# Patient Record
Sex: Female | Born: 1945
Health system: Southern US, Community
[De-identification: ages and names within clinical notes are randomized; demographics above are authoritative.]

## PROBLEM LIST (undated history)

## (undated) DIAGNOSIS — E785 Hyperlipidemia, unspecified: Secondary | ICD-10-CM

## (undated) DIAGNOSIS — F32A Depression, unspecified: Secondary | ICD-10-CM

## (undated) DIAGNOSIS — A4902 Methicillin resistant Staphylococcus aureus infection, unspecified site: Secondary | ICD-10-CM

## (undated) DIAGNOSIS — M1990 Unspecified osteoarthritis, unspecified site: Secondary | ICD-10-CM

## (undated) DIAGNOSIS — I10 Essential (primary) hypertension: Secondary | ICD-10-CM

## (undated) DIAGNOSIS — Z8669 Personal history of other diseases of the nervous system and sense organs: Secondary | ICD-10-CM

## (undated) DIAGNOSIS — F329 Major depressive disorder, single episode, unspecified: Secondary | ICD-10-CM

## (undated) DIAGNOSIS — N96 Recurrent pregnancy loss: Secondary | ICD-10-CM

## (undated) DIAGNOSIS — F419 Anxiety disorder, unspecified: Secondary | ICD-10-CM

## (undated) HISTORY — DX: Depression, unspecified: F32.A

## (undated) HISTORY — DX: Unspecified osteoarthritis, unspecified site: M19.90

## (undated) HISTORY — DX: Hyperlipidemia, unspecified: E78.5

## (undated) HISTORY — DX: Personal history of other diseases of the nervous system and sense organs: Z86.69

## (undated) HISTORY — DX: Major depressive disorder, single episode, unspecified: F32.9

## (undated) HISTORY — PX: BREAST EXCISIONAL BIOPSY: SUR124

## (undated) HISTORY — PX: TUMOR EXCISION: SHX421

## (undated) HISTORY — PX: TONSILLECTOMY: SUR1361

## (undated) HISTORY — DX: Recurrent pregnancy loss: N96

## (undated) HISTORY — PX: SKIN CANCER DESTRUCTION: SHX778

## (undated) HISTORY — DX: Anxiety disorder, unspecified: F41.9

## (undated) HISTORY — DX: Methicillin resistant Staphylococcus aureus infection, unspecified site: A49.02

## (undated) HISTORY — DX: Essential (primary) hypertension: I10

## (undated) HISTORY — PX: TUBAL LIGATION: SHX77

---

## 2007-06-16 ENCOUNTER — Ambulatory Visit: Payer: Self-pay | Admitting: Internal Medicine

## 2007-06-16 DIAGNOSIS — Z87898 Personal history of other specified conditions: Secondary | ICD-10-CM

## 2007-06-16 DIAGNOSIS — J309 Allergic rhinitis, unspecified: Secondary | ICD-10-CM

## 2007-06-16 DIAGNOSIS — I10 Essential (primary) hypertension: Secondary | ICD-10-CM

## 2007-06-16 DIAGNOSIS — F411 Generalized anxiety disorder: Secondary | ICD-10-CM | POA: Insufficient documentation

## 2007-06-16 DIAGNOSIS — M129 Arthropathy, unspecified: Secondary | ICD-10-CM | POA: Insufficient documentation

## 2007-06-24 ENCOUNTER — Telehealth (INDEPENDENT_AMBULATORY_CARE_PROVIDER_SITE_OTHER): Payer: Self-pay | Admitting: *Deleted

## 2007-07-01 ENCOUNTER — Ambulatory Visit: Payer: Self-pay | Admitting: Internal Medicine

## 2007-07-01 DIAGNOSIS — L0390 Cellulitis, unspecified: Secondary | ICD-10-CM

## 2007-07-01 DIAGNOSIS — D485 Neoplasm of uncertain behavior of skin: Secondary | ICD-10-CM

## 2007-07-01 DIAGNOSIS — L0291 Cutaneous abscess, unspecified: Secondary | ICD-10-CM

## 2007-07-29 ENCOUNTER — Encounter (INDEPENDENT_AMBULATORY_CARE_PROVIDER_SITE_OTHER): Payer: Self-pay | Admitting: Internal Medicine

## 2007-07-29 ENCOUNTER — Other Ambulatory Visit: Admission: RE | Admit: 2007-07-29 | Discharge: 2007-07-29 | Payer: Self-pay

## 2007-07-29 ENCOUNTER — Telehealth (INDEPENDENT_AMBULATORY_CARE_PROVIDER_SITE_OTHER): Payer: Self-pay | Admitting: *Deleted

## 2007-07-29 ENCOUNTER — Ambulatory Visit: Payer: Self-pay | Admitting: Internal Medicine

## 2007-07-29 LAB — CONVERTED CEMR LAB: OCCULT 1: POSITIVE

## 2007-07-30 LAB — CONVERTED CEMR LAB
Calcium: 9.5 mg/dL (ref 8.4–10.5)
Creatinine, Ser: 0.75 mg/dL (ref 0.40–1.20)
HDL: 59 mg/dL (ref >39)
Triglycerides: 131 mg/dL (ref <150)

## 2007-08-01 ENCOUNTER — Telehealth (INDEPENDENT_AMBULATORY_CARE_PROVIDER_SITE_OTHER): Payer: Self-pay | Admitting: *Deleted

## 2007-08-01 ENCOUNTER — Ambulatory Visit (HOSPITAL_COMMUNITY): Admission: RE | Admit: 2007-08-01 | Discharge: 2007-08-01 | Payer: Self-pay | Admitting: Internal Medicine

## 2007-08-01 ENCOUNTER — Encounter (INDEPENDENT_AMBULATORY_CARE_PROVIDER_SITE_OTHER): Payer: Self-pay | Admitting: Internal Medicine

## 2007-08-02 ENCOUNTER — Encounter (INDEPENDENT_AMBULATORY_CARE_PROVIDER_SITE_OTHER): Payer: Self-pay | Admitting: Internal Medicine

## 2007-08-03 ENCOUNTER — Telehealth (INDEPENDENT_AMBULATORY_CARE_PROVIDER_SITE_OTHER): Payer: Self-pay | Admitting: *Deleted

## 2007-08-03 ENCOUNTER — Encounter (INDEPENDENT_AMBULATORY_CARE_PROVIDER_SITE_OTHER): Payer: Self-pay | Admitting: *Deleted

## 2007-08-04 ENCOUNTER — Ambulatory Visit: Payer: Self-pay | Admitting: Internal Medicine

## 2007-08-05 ENCOUNTER — Encounter (INDEPENDENT_AMBULATORY_CARE_PROVIDER_SITE_OTHER): Payer: Self-pay | Admitting: Internal Medicine

## 2007-11-22 ENCOUNTER — Ambulatory Visit: Payer: Self-pay | Admitting: Internal Medicine

## 2007-12-13 ENCOUNTER — Telehealth (INDEPENDENT_AMBULATORY_CARE_PROVIDER_SITE_OTHER): Payer: Self-pay | Admitting: *Deleted

## 2008-01-23 ENCOUNTER — Ambulatory Visit: Payer: Self-pay | Admitting: Internal Medicine

## 2008-01-23 DIAGNOSIS — R5381 Other malaise: Secondary | ICD-10-CM

## 2008-01-23 DIAGNOSIS — R5383 Other fatigue: Secondary | ICD-10-CM

## 2008-01-24 LAB — CONVERTED CEMR LAB
Basophils Absolute: 0 10*3/uL (ref 0.0–0.1)
Calcium: 10 mg/dL (ref 8.4–10.5)
Eosinophils Absolute: 0 10*3/uL (ref 0.0–0.7)
Eosinophils Relative: 1 % (ref 0–5)
Glucose, Bld: 117 mg/dL — ABNORMAL HIGH (ref 70–99)
HCT: 41 % (ref 36.0–46.0)
MCV: 96.7 fL (ref 78.0–100.0)
Platelets: 316 10*3/uL (ref 150–400)
RDW: 12.6 % (ref 11.5–15.5)
Sodium: 139 meq/L (ref 135–145)
TSH: 0.49 microintl units/mL (ref 0.350–4.50)

## 2008-02-23 ENCOUNTER — Ambulatory Visit: Payer: Self-pay | Admitting: Internal Medicine

## 2008-03-15 ENCOUNTER — Ambulatory Visit: Payer: Self-pay | Admitting: Internal Medicine

## 2008-03-16 ENCOUNTER — Telehealth (INDEPENDENT_AMBULATORY_CARE_PROVIDER_SITE_OTHER): Payer: Self-pay | Admitting: *Deleted

## 2008-03-16 ENCOUNTER — Encounter (INDEPENDENT_AMBULATORY_CARE_PROVIDER_SITE_OTHER): Payer: Self-pay | Admitting: Internal Medicine

## 2008-03-21 ENCOUNTER — Telehealth (INDEPENDENT_AMBULATORY_CARE_PROVIDER_SITE_OTHER): Payer: Self-pay | Admitting: *Deleted

## 2008-04-19 ENCOUNTER — Ambulatory Visit: Payer: Self-pay | Admitting: Internal Medicine

## 2008-07-13 ENCOUNTER — Ambulatory Visit: Payer: Self-pay | Admitting: Internal Medicine

## 2008-07-13 DIAGNOSIS — J019 Acute sinusitis, unspecified: Secondary | ICD-10-CM | POA: Insufficient documentation

## 2008-07-16 ENCOUNTER — Encounter (INDEPENDENT_AMBULATORY_CARE_PROVIDER_SITE_OTHER): Payer: Self-pay | Admitting: Internal Medicine

## 2008-07-17 LAB — CONVERTED CEMR LAB
Chloride: 106 meq/L (ref 96–112)
Cholesterol: 235 mg/dL — ABNORMAL HIGH (ref 0–200)
Creatinine, Ser: 0.7 mg/dL (ref 0.40–1.20)
HDL: 52 mg/dL (ref >39)
LDL Cholesterol: 160 mg/dL — ABNORMAL HIGH (ref 0–99)
Potassium: 4.7 meq/L (ref 3.5–5.3)
Triglycerides: 113 mg/dL (ref <150)

## 2008-07-26 ENCOUNTER — Ambulatory Visit: Payer: Self-pay | Admitting: Infectious Disease

## 2008-07-26 DIAGNOSIS — A4902 Methicillin resistant Staphylococcus aureus infection, unspecified site: Secondary | ICD-10-CM | POA: Insufficient documentation

## 2008-07-26 DIAGNOSIS — J320 Chronic maxillary sinusitis: Secondary | ICD-10-CM

## 2008-07-26 LAB — CONVERTED CEMR LAB
AST: 20 units/L (ref 0–37)
Alkaline Phosphatase: 71 units/L (ref 39–117)
BUN: 20 mg/dL (ref 6–23)
Basophils Absolute: 0 10*3/uL (ref 0.0–0.1)
Basophils Relative: 0 % (ref 0–1)
Creatinine, Ser: 0.74 mg/dL (ref 0.40–1.20)
Eosinophils Absolute: 0.1 10*3/uL (ref 0.0–0.7)
Eosinophils Relative: 1 % (ref 0–5)
Glucose, Bld: 91 mg/dL (ref 70–99)
HCT: 40.4 % (ref 36.0–46.0)
Lymphocytes Relative: 25 % (ref 12–46)
MCHC: 32.9 g/dL (ref 30.0–36.0)
Platelets: 283 10*3/uL (ref 150–400)
RDW: 12.5 % (ref 11.5–15.5)
Total Bilirubin: 0.2 mg/dL — ABNORMAL LOW (ref 0.3–1.2)

## 2008-09-03 ENCOUNTER — Ambulatory Visit: Payer: Self-pay | Admitting: Infectious Disease

## 2008-09-03 DIAGNOSIS — R21 Rash and other nonspecific skin eruption: Secondary | ICD-10-CM

## 2008-11-29 ENCOUNTER — Ambulatory Visit: Payer: Self-pay | Admitting: Internal Medicine

## 2009-01-11 ENCOUNTER — Telehealth (INDEPENDENT_AMBULATORY_CARE_PROVIDER_SITE_OTHER): Payer: Self-pay | Admitting: Internal Medicine

## 2009-02-05 ENCOUNTER — Ambulatory Visit (HOSPITAL_COMMUNITY): Admission: RE | Admit: 2009-02-05 | Discharge: 2009-02-05 | Payer: Self-pay | Admitting: Family Medicine

## 2009-11-12 ENCOUNTER — Encounter: Payer: Self-pay | Admitting: Family Medicine

## 2009-11-12 ENCOUNTER — Ambulatory Visit (HOSPITAL_COMMUNITY): Admission: RE | Admit: 2009-11-12 | Discharge: 2009-11-12 | Payer: Self-pay | Admitting: Family Medicine

## 2009-12-23 ENCOUNTER — Ambulatory Visit: Payer: Self-pay | Admitting: Family Medicine

## 2009-12-23 ENCOUNTER — Encounter: Payer: Self-pay | Admitting: Physician Assistant

## 2009-12-23 DIAGNOSIS — E559 Vitamin D deficiency, unspecified: Secondary | ICD-10-CM | POA: Insufficient documentation

## 2009-12-23 DIAGNOSIS — R079 Chest pain, unspecified: Secondary | ICD-10-CM

## 2009-12-23 DIAGNOSIS — E785 Hyperlipidemia, unspecified: Secondary | ICD-10-CM

## 2009-12-27 ENCOUNTER — Encounter: Payer: Self-pay | Admitting: Physician Assistant

## 2009-12-30 LAB — CONVERTED CEMR LAB
AST: 20 units/L (ref 0–37)
Alkaline Phosphatase: 81 units/L (ref 39–117)
BUN: 15 mg/dL (ref 6–23)
Calcium: 9.4 mg/dL (ref 8.4–10.5)
Creatinine, Ser: 0.75 mg/dL (ref 0.40–1.20)
Glucose, Bld: 73 mg/dL (ref 70–99)
HCT: 39.9 % (ref 36.0–46.0)
HDL: 38 mg/dL — ABNORMAL LOW (ref >39)
Hemoglobin: 13.2 g/dL (ref 12.0–15.0)
MCHC: 33.1 g/dL (ref 30.0–36.0)
RBC: 4.14 M/uL (ref 3.87–5.11)
RDW: 12.5 % (ref 11.5–15.5)
TSH: 2.953 microintl units/mL (ref 0.350–4.500)
Total CHOL/HDL Ratio: 6.7
Triglycerides: 406 mg/dL — ABNORMAL HIGH (ref <150)

## 2009-12-31 ENCOUNTER — Encounter: Payer: Self-pay | Admitting: Adult Health

## 2009-12-31 ENCOUNTER — Ambulatory Visit: Payer: Self-pay | Admitting: Cardiology

## 2009-12-31 ENCOUNTER — Encounter (INDEPENDENT_AMBULATORY_CARE_PROVIDER_SITE_OTHER): Payer: Self-pay | Admitting: *Deleted

## 2010-01-17 ENCOUNTER — Ambulatory Visit (HOSPITAL_COMMUNITY): Admission: RE | Admit: 2010-01-17 | Discharge: 2010-01-17 | Payer: Self-pay | Admitting: Cardiology

## 2010-01-17 ENCOUNTER — Encounter: Payer: Self-pay | Admitting: Cardiology

## 2010-01-17 ENCOUNTER — Ambulatory Visit: Payer: Self-pay | Admitting: Cardiology

## 2010-01-20 ENCOUNTER — Encounter: Payer: Self-pay | Admitting: Physician Assistant

## 2010-01-21 ENCOUNTER — Ambulatory Visit: Payer: Self-pay | Admitting: Cardiology

## 2010-02-12 ENCOUNTER — Telehealth: Payer: Self-pay | Admitting: Adult Health

## 2010-02-24 ENCOUNTER — Other Ambulatory Visit: Admission: RE | Admit: 2010-02-24 | Discharge: 2010-02-24 | Payer: Self-pay | Admitting: Family Medicine

## 2010-02-24 ENCOUNTER — Ambulatory Visit: Payer: Self-pay | Admitting: Family Medicine

## 2010-02-24 DIAGNOSIS — K921 Melena: Secondary | ICD-10-CM | POA: Insufficient documentation

## 2010-02-24 LAB — CONVERTED CEMR LAB
HDL goal, serum: 40 mg/dL
LDL Goal: 130 mg/dL

## 2010-03-05 ENCOUNTER — Telehealth: Payer: Self-pay | Admitting: Family Medicine

## 2010-03-10 ENCOUNTER — Telehealth (INDEPENDENT_AMBULATORY_CARE_PROVIDER_SITE_OTHER): Payer: Self-pay | Admitting: *Deleted

## 2010-03-17 ENCOUNTER — Ambulatory Visit (HOSPITAL_COMMUNITY): Admission: RE | Admit: 2010-03-17 | Payer: Self-pay | Admitting: Family Medicine

## 2010-05-28 ENCOUNTER — Ambulatory Visit
Admission: RE | Admit: 2010-05-28 | Discharge: 2010-05-28 | Payer: Self-pay | Source: Home / Self Care | Attending: Family Medicine | Admitting: Family Medicine

## 2010-05-29 ENCOUNTER — Encounter: Payer: Self-pay | Admitting: Family Medicine

## 2010-05-30 LAB — CONVERTED CEMR LAB
ALT: 24 units/L (ref 0–35)
Albumin: 4.3 g/dL (ref 3.5–5.2)
BUN: 15 mg/dL (ref 6–23)
Chloride: 101 meq/L (ref 96–112)
Cholesterol: 208 mg/dL — ABNORMAL HIGH (ref 0–200)
Hgb A1c MFr Bld: 5.5 % (ref <5.7)
Indirect Bilirubin: 0.4 mg/dL (ref 0.0–0.9)
LDL Cholesterol: 127 mg/dL — ABNORMAL HIGH (ref 0–99)
Potassium: 4 meq/L (ref 3.5–5.3)
Sodium: 138 meq/L (ref 135–145)
Total CHOL/HDL Ratio: 4.3
Total Protein: 6.9 g/dL (ref 6.0–8.3)
Triglycerides: 163 mg/dL — ABNORMAL HIGH (ref <150)
VLDL: 33 mg/dL (ref 0–40)

## 2010-05-31 ENCOUNTER — Encounter: Payer: Self-pay | Admitting: Family Medicine

## 2010-06-01 ENCOUNTER — Encounter: Payer: Self-pay | Admitting: Internal Medicine

## 2010-06-10 NOTE — Letter (Signed)
Summary: [place] Results Engineer, agricultural at [facility]  [address], [place] [address]   Phone: [phone]  Fax: [phone]      January 17, 2010 MRN: [id number]   Sara Woods [address] [place], [place]  [address]   Dear Ms. Onstad,  Your test ordered by Selena Batten has been reviewed by your physician (or physician assistant) and was found to be normal or stable. Your physician (or physician assistant) felt no changes were needed at this time.  __X__ Echocardiogram  ____ Cardiac Stress Test  ____ Lab Work  ____ Peripheral vascular study of arms, legs or neck  ____ CT scan or X-ray  ____ Lung or Breathing test  ____ Other:  Please continue on current medical treatment.  Thank you.   Nona Dell, MD, F.A.C.C

## 2010-06-10 NOTE — Progress Notes (Signed)
Summary: mammogram appointment  Phone Note Outgoing Call   Summary of Call: I called patient to give her her mammogram appointment at [facility] set for Monday Nov. 7, 2011 at 1:20 register time.  Initial call taken by: Curtis Sites,  March 10, 2010 6:22 PM  Follow-up for Phone Call        spoke with patient she is aware of appointment. Follow-up by: Curtis Sites,  March 11, 2010 11:23 AM

## 2010-06-10 NOTE — Letter (Signed)
Summary: Stress Echocardiogram Information Sheet  [facility]  [address], [place] [address]   Phone: [phone]  Fax: [phone]      December 31, 2009 MRN: [id number] light prior to the test.   Sara Woods  Doctor: Appointment Date: Appointment Time: Appointment Location: [facility]  Stress Echocardiogram Information Sheet    Instructions:   1. DO NOT  take your AM  medicine THE MORNING before the test.  2.NOTHING TO EAT OR DRINK AFTER MIDNIGHT  3. Dress prepared to exercise.  4. DO NOT use ANY caffine or tobacco products 3 hours before appointment.  5. Report to the Short Stay Center on the1st floor.  6. Please bring all current prescription medications.  7. If you have any questions, please call [phone]

## 2010-06-10 NOTE — Assessment & Plan Note (Signed)
Summary: PHY   Vital Signs:  Patient profile:   65 year old female Height:      67 inches Weight:      189 pounds BMI:     29.71 O2 Sat:      98 % on Room air Pulse rate:   100 / minute Resp:     16 per minute BP sitting:   110 / 70  (right arm)  Vitals Entered By: Mauricia Area CMA (February 24, 2010 1:11 PM) CC: physical, Lipid Management Comments Did not bring meds   Primary Mayan Kloepfer:  Dr.Margaret Lodema Hong  CC:  physical and Lipid Management.  History of Present Illness: Pt is being seen today for a physical.  Last physical 2+  ago. Overall feeling well and no current complaints or concers. Menopausal. Colonoscopy referral previously done.  Pt needs to call to schedule. Last Mamm 3-09  .   + SBEs Last eye exam  5-6 yrs ago. + routine dental care Last TD > 10 yrs. Had flu vaccine at pharmacy this yr already. Pt has derm appt in November for skin check.  Hx of skin CA. Has seen cardiology.  Started on Crestor for lipids.        Lipid Management History:      Positive NCEP/ATP III risk factors include female age 37 years old or older, HDL cholesterol less than 40, and hypertension.  Negative NCEP/ATP III risk factors include non-tobacco-user status.    Allergies (verified): 1)  Penicillin 2)  Sulfa  Past History:  Past medical history reviewed for relevance to current acute and chronic problems.  Past Medical History: Reviewed history from 12/23/2009 and no changes required. Allergic rhinitis Anxiety Hypertension arthritis migraines, hx skin cancer melanomas - Dr Orvan Falconer in [place] breast cancer sp breast lumpectomy  Recurrent MRSA abscesses  Review of Systems General:  Denies chills, fever, and loss of appetite. Eyes:  Denies blurring and double vision. ENT:  Complains of earache; denies nasal congestion, sinus pressure, and sore throat. CV:  Denies chest pain or discomfort, lightheadness, and palpitations. Resp:  Denies cough and shortness of  breath. GI:  Complains of constipation; denies abdominal pain, bloody stools, change in bowel habits, dark tarry stools, diarrhea, loss of appetite, nausea, and vomiting. GU:  Denies abnormal vaginal bleeding, discharge, dysuria, incontinence, and urinary frequency. MS:  Complains of joint pain; denies joint swelling, low back pain, and mid back pain. Neuro:  Complains of headaches; HX OF MIGRAINES X 20-25 YRS, NO CHANGES. Psych:  Complains of anxiety.  Physical Exam  General:  Well-developed,well-nourished,in no acute distress; alert,appropriate and cooperative throughout examination Head:  Normocephalic and atraumatic without obvious abnormalities. No apparent alopecia or balding. Eyes:  No corneal or conjunctival inflammation noted. EOMI. Perrla. Funduscopic exam benign, without hemorrhages, exudates or papilledema.  Ears:  External ear exam shows no significant lesions or deformities.  Otoscopic examination reveals clear canals, tympanic membranes are intact bilaterally without bulging, retraction, inflammation or discharge. Hearing is grossly normal bilaterally. Nose:  External nasal examination shows no deformity or inflammation. Nasal mucosa are pink and moist without lesions or exudates. Mouth:  Oral mucosa and oropharynx without lesions or exudates.  Teeth in good repair. Neck:  No deformities, masses, or tenderness noted.no thyromegaly, no thyroid nodules or tenderness, and no carotid bruits.   Chest Wall:  no deformities and no mass.   Breasts:  No mass, nodules, thickening, tenderness, bulging, retraction, inflamation, nipple discharge or skin changes noted.   Lungs:  Normal  respiratory effort, chest expands symmetrically. Lungs are clear to auscultation, no crackles or wheezes. Heart:  Normal rate and regular rhythm. S1 and S2 normal without gallop, murmur, click, rub or other extra sounds. Abdomen:  Bowel sounds positive,abdomen soft and non-tender without masses, organomegaly or  hernias noted. Rectal:  No external abnormalities noted. Normal sphincter tone. No rectal masses or tenderness. Genitalia:  Normal introitus for age, no external lesions, no vaginal discharge, mucosa pink and moist, no vaginal or cervical lesions, no vaginal atrophy, no friaility or hemorrhage, normal uterus size and position, no adnexal masses or tenderness Pulses:  R radial normal, R posterior tibial normal, R dorsalis pedis normal, R carotid normal, L radial normal, L posterior tibial normal, L dorsalis pedis normal, and L carotid normal.   Extremities:  No clubbing, cyanosis, edema, or deformity noted with normal full range of motion of all joints.   Neurologic:  alert & oriented X3, sensation intact to light touch, gait normal, and DTRs symmetrical and normal.   Cervical Nodes:  No lymphadenopathy noted Axillary Nodes:  No palpable lymphadenopathy Psych:  Cognition and judgment appear intact. Alert and cooperative with normal attention span and concentration. No apparent delusions, illusions, hallucinations   Impression & Recommendations:  Problem # 1:  PREVENTIVE HEALTH CARE (ICD-V70.0) Encouraed routine eye exam and dental care. TDap given today.  Problem # 2:  HEMOCCULT POSITIVE STOOL (ICD-578.1) Assessment: New  Pt was referred to GI.  They have tried to contact pt and she needs to call them back to schedule.  Encouraged pt to do so.  Orders: Hemoccult Guaiac-1 spec.(in office) (82270)  Problem # 3:  HYPERLIPIDEMIA (ICD-272.4) Assessment: Comment Only  Her updated medication list for this problem includes:    Crestor 10 Mg Tabs (Rosuvastatin calcium) .Marland Kitchen... Take 1 tablet by mouth once a day samples  Orders: T-Lipid Profile ([address]-60[address]) T-Hepatic Function ([address]11-91478)  Problem # 4:  ANXIETY (ICD-300.00) Assessment: Comment Only  Her updated medication list for this problem includes:    Doxepin Hcl 10 Mg Caps (Doxepin hcl) .Marland Kitchen... Take 2 at bedtime  Discussed medication  use and relaxation techniques.   Complete Medication List: 1)  Lisinopril 10 Mg Tabs (Lisinopril) .Marland Kitchen.. 1 by mouth once daily 2)  Vitamin D (ergocalciferol) 50000 Unit Caps (Ergocalciferol) .... Take 1 capsule once a week 3)  Doxepin Hcl 10 Mg Caps (Doxepin hcl) .... Take 2 at bedtime 4)  Loratadine 10 Mg Tabs (Loratadine) .... Take 1 daily as needed for allergies 5)  Bactroban Nasal 2 % Oint (Mupirocin calcium) .... Apply to each nostril as directed 6)  Crestor 10 Mg Tabs (Rosuvastatin calcium) .... Take 1 tablet by mouth once a day samples 7)  Hydrochlorothiazide 25 Mg Tabs (Hydrochlorothiazide) .... Take one tablet by mouth daily.  Other Orders: Mammogram (Screening) (Mammo) Pap Smear (29562) Tdap => 81yrs IM (13086) Admin 1st Vaccine (57846)  Lipid Assessment/Plan:      Based on NCEP/ATP III, the patient's risk factor category is "0-1 risk factors".  The patient's lipid goals are as follows: Total cholesterol goal is 200; LDL cholesterol goal is 130; HDL cholesterol goal is 40; Triglyceride goal is 150.     Patient Instructions: 1)  Please schedule a follow-up appointment in 3 months. 2)  Have blood work drawn fasting in December. 3)  I have ordered a mammogram for you. 4)  Please follow up with DrRourks office for your colonoscopy. 5)  You have received a Tetnus vaccine today (Tdap). 6)  Schedule  an eye exam   Orders Added: 1)  Mammogram (Screening) [Mammo] 2)  T-Lipid Profile [80061-22930] 3)  T-Hepatic Function [80076-22960] 4)  Pap Smear [88150] 5)  Tdap => 45yrs IM [90715] 6)  Admin 1st Vaccine [90471] 7)  Est. Patient 40-64 years [99396] 8)  Hemoccult Guaiac-1 spec.(in office) [82270]   Immunizations Administered:  Tetanus Vaccine:    Vaccine Type: Tdap    Site: left deltoid    Mfr: GlaxoSmithKline    Dose: 0.5 ml    Route: IM    Given by: Mauricia Area CMA    Exp. Date: 02/28/2012    Lot #: [id number]    VIS given: 03/28/08 version given February 24, 2010.   Immunizations Administered:  Tetanus Vaccine:    Vaccine Type: Tdap    Site: left deltoid    Mfr: GlaxoSmithKline    Dose: 0.5 ml    Route: IM    Given by: Mauricia Area CMA    Exp. Date: 02/28/2012    Lot #: [id number]    VIS given: 03/28/08 version given February 24, 2010.   Laboratory Results    Stool - Occult Blood Hemmoccult #1: positive Date: 02/24/2010

## 2010-06-10 NOTE — Assessment & Plan Note (Signed)
Summary: new pt   Vital Signs:  Patient profile:   65 year old female Height:      67 inches Weight:      186 pounds BMI:     29.24 O2 Sat:      98 % on Room air Pulse rate:   82 / minute BP sitting:   140 / 80  (right arm) CC: would like to get established with new pcp/ does feel she has a sinus infection/room 1 Is Patient Diabetic? No   CC:  would like to get established with new pcp/ does feel she has a sinus infection/room 1.  History of Present Illness: New pt here to establish care with new PCP.  Pt states she feels like she has had a sinus infection x 2 weeks.  Pain and decreased hearing in Rt ear. Yellow nasal mucus.  Chills off & on.  Productive cough.  Sore throat x 1 week.  Hx of migraines.  Often triggered by sinus infections.  States the only thing that works is Physiological scientist.  Hx of HTN.  Taking meds as prescribed.  States had an episode of chest pain approx 2 weeks ago.  She has a hx of intermittent chest pain that she associates with stress.  States the pain that she had though 2 weeks ago felt like an elephant sitting on her chest.  No difficulty breathing or diaphoresis.      Allergies: 1)  Penicillin 2)  Sulfa  Past History:  Past medical, surgical, family and social histories (including risk factors) reviewed, and no changes noted (except as noted below).  Past Medical History: Allergic rhinitis Anxiety Hypertension arthritis migraines, hx skin cancer melanomas - Dr Orvan Falconer in [place] breast cancer sp breast lumpectomy  Recurrent MRSA abscesses  Past Surgical History: Reviewed history from 06/16/2007 and no changes required. tongue tumor skin cancer-back of left leg, right shoulder tonsils-childhood  Family History: Reviewed history from 07/29/2007 and no changes required. father-deceased-84-head trauma requiring craniotomy, metastatic prostate cancer-12/07 mother-80-brain tumors, CVD, osteoporosis-12/04 brother-CVD sister-obese,  fibromyalgia son-42-stroke--vertebral artery dissection  Social History: Reviewed history from 07/26/2008 and no changes required. Married lives with spouse, who had been admitted for lumbar surgery SON had CVA in January of 2009 Never Smoked Alcohol use-no Drug use-no  Review of Systems General:  Complains of chills; denies fever. ENT:  Complains of decreased hearing, earache, nasal congestion, postnasal drainage, sinus pressure, and sore throat. CV:  Complains of chest pain or discomfort; denies palpitations; 2 weeks ago. Resp:  Complains of cough and sputum productive. GI:  Denies abdominal pain and change in bowel habits. Allergy:  Complains of seasonal allergies.  Physical Exam  General:  Well-developed,well-nourished,in no acute distress; alert,appropriate and cooperative throughout examination Head:  Normocephalic and atraumatic without obvious abnormalities. No apparent alopecia or balding. Ears:  External ear exam shows no significant lesions or deformities.  Otoscopic examination reveals clear canals, tympanic membranes are intact bilaterally without bulging, retraction, inflammation or discharge. Hearing is grossly normal bilaterally. Nose:  Yellow mucus noted Lt nare, with mild erythema of turbs. no external deformity, R frontal sinus tenderness, and R maxillary sinus tenderness.   Mouth:  Oral mucosa and oropharynx without lesions or exudates.   Neck:  No deformities, masses, or tenderness noted.no thyromegaly.   Lungs:  Normal respiratory effort, chest expands symmetrically. Lungs are clear to auscultation, no crackles or wheezes. Heart:  Normal rate and regular rhythm. S1 and S2 normal without gallop, murmur, click, rub or other extra  sounds. Cervical Nodes:  No lymphadenopathy noted Psych:  Cognition and judgment appear intact. Alert and cooperative with normal attention span and concentration. No apparent delusions, illusions, hallucinations   Impression &  Recommendations:  Problem # 1:  HYPERTENSION (ICD-401.9) Assessment Comment Only  Her updated medication list for this problem includes:    Lisinopril 10 Mg Tabs (Lisinopril) .Marland Kitchen... 1 by mouth once daily  BP today: 140/80 Prior BP: 114/78 (11/29/2008)  Labs Reviewed: K+: 3.9 (07/26/2008) Creat: : 0.74 (07/26/2008)   Chol: 235 (07/16/2008)   HDL: 52 (07/16/2008)   LDL: 160 (07/16/2008)   TG: 113 (07/16/2008)  Problem # 2:  CHEST PAIN (ICD-786.50) Assessment: New  Orders: Cardiology Referral (Cardiology) EKG w/ Interpretation (93000)  Problem # 3:  HYPERLIPIDEMIA (ICD-272.4) Assessment: Comment Only  Orders: T-Lipid Profile [id number])  Labs Reviewed: SGOT: 20 (07/26/2008)   SGPT: 16 (07/26/2008)   HDL:52 (07/16/2008), 59 (07/29/2007)  LDL:160 (07/16/2008), 129 (07/29/2007)  Chol:235 (07/16/2008), 214 (07/29/2007)  Trig:113 (07/16/2008), 131 (07/29/2007)  Problem # 4:  ANXIETY (ICD-300.00) Assessment: Comment Only  The following medications were removed from the medication list:    Citalopram Hydrobromide 20 Mg Tabs (Citalopram hydrobromide) .Marland Kitchen... 1 by mouth at bedtime Her updated medication list for this problem includes:    Doxepin Hcl 10 Mg Caps (Doxepin hcl) .Marland Kitchen... Take 2 at bedtime  Complete Medication List: 1)  Lisinopril 10 Mg Tabs (Lisinopril) .Marland Kitchen.. 1 by mouth once daily 2)  Vitamin D (ergocalciferol) 50000 Unit Caps (Ergocalciferol) .... Take 1 capsule once a week 3)  Doxepin Hcl 10 Mg Caps (Doxepin hcl) .... Take 2 at bedtime 4)  Loratadine 10 Mg Tabs (Loratadine) .... Take 1 daily as needed for allergies 5)  Cephalexin 500 Mg Caps (Cephalexin) .... Take 1 three times a day x 10 days 6)  Nasonex 50 Mcg/act Susp (Mometasone furoate) .... Use 2 sprays each nostril once daily  Other Orders: T-Comprehensive Metabolic Panel (09811-91478) T-CBC No Diff (29562-13086) T-TSH (57846-96295) T-Vitamin D (25-Hydroxy) (28413-24401) Gastroenterology Referral  (GI)  Patient Instructions: 1)  Please schedule a follow-up appointment in 2 months for physical. 2)  I have ordered blood work.  Please have this drawn fasting. 3)  continue your current medications. 4)  I have prescribed an antibiotic and nasal spray for you. 5)  I am referring you to Cardiology and GI as we discussed. Prescriptions: NASONEX 50 MCG/ACT SUSP (MOMETASONE FUROATE) use 2 sprays each nostril once daily  #1 x 2   Entered and Authorized by:   Esperanza Sheets PA   Signed by:   Esperanza Sheets PA on 12/23/2009   Method used:   Electronically to        [facility] [address] (retail)       [address]       [place], [place]  [address]       Ph: [phone] or [phone]       Fax: [phone]   RxID:   [id number] CEPHALEXIN 500 MG CAPS (CEPHALEXIN) take 1 three times a day x 10 days  #30 x 0   Entered and Authorized by:   Esperanza Sheets PA   Signed by:   Esperanza Sheets PA on 12/23/2009   Method used:   Electronically to        [facility] [address] (retail)       [address]       [place]  [place], [place]  [address]       Ph: [phone] or [phone]       Fax: [phone]   RxID:   [id number]

## 2010-06-10 NOTE — Assessment & Plan Note (Signed)
Summary: NP6 CHEST PAIN   Visit Type:  Initial Consult Primary Provider:  Dr.Margaret Lodema Hong  CC:  chest pain.  History of Present Illness: Mrs. Sara Woods is a 21 CF we seeing at the request of Esperanza Sheets PA for chest pain.  Mrs. Sara Woods has not been seen by a cardiologist in the past.  She initally was seen by Ms. Garnette Czech for complaints of sinus infection.  On intial assessment in her office, the patient described chest pressure during the time she was experiencing sinus issues.  She has a strong family hx for heart disease and therefore we are asked to see her for further assessment.  She has not had any recurrance of chest pain. She has experienced no other symptoms.  Preventive Screening-Counseling & Management  Alcohol-Tobacco     Alcohol drinks/day: 0     Smoking Status: never  Current Medications (verified): 1)  Lisinopril 10 Mg  Tabs (Lisinopril) .Marland Kitchen.. 1 By Mouth Once Daily 2)  Vitamin D (Ergocalciferol) 50000 Unit Caps (Ergocalciferol) .... Take 1 Capsule Once A Week 3)  Doxepin Hcl 10 Mg Caps (Doxepin Hcl) .... Take 2 At Bedtime 4)  Loratadine 10 Mg Tabs (Loratadine) .... Take 1 Daily As Needed For Allergies 5)  Cephalexin 500 Mg Caps (Cephalexin) .... Take 1 Three Times A Day X 10 Days 6)  Nasonex 50 Mcg/act Susp (Mometasone Furoate) .... Use 2 Sprays Each Nostril Once Daily 7)  Bactroban Nasal 2 % Oint (Mupirocin Calcium) .... Apply To Each Nostril As Directed 8)  Crestor 10 Mg Tabs (Rosuvastatin Calcium) .... Take 1 Tablet By Mouth Once A Day Samples  Allergies (verified): 1)  Penicillin 2)  Sulfa  Past History:  Past medical, surgical, family and social histories (including risk factors) reviewed, and no changes noted (except as noted below).  Past Medical History: Reviewed history from 12/23/2009 and no changes required. Allergic rhinitis Anxiety Hypertension arthritis migraines, hx skin cancer melanomas - Dr Orvan Falconer in [place] breast cancer sp breast lumpectomy    Recurrent MRSA abscesses  Past Surgical History: Reviewed history from 06/16/2007 and no changes required. tongue tumor skin cancer-back of left leg, right shoulder tonsils-childhood  Family History: Reviewed history from 07/29/2007 and no changes required. father-deceased-84-head trauma requiring craniotomy, metastatic prostate cancer-12/07 mother-80-brain tumors, CVD, osteoporosis-12/04 brother-CVD sister-obese, fibromyalgia son-42-stroke--vertebral artery dissection  Social History: Reviewed history from 07/26/2008 and no changes required. Married lives with spouse, who had been admitted for lumbar surgery SON had CVA in January of 2009 Never Smoked Alcohol use-no Drug use-no  Review of Systems       Chest pressure  All other systems have been reviewed and are negative unless stated above.   Vital Signs:  Patient profile:   65 year old female Weight:      186 pounds BMI:     29.24 Pulse rate:   80 / minute BP sitting:   153 / 83  (right arm)  Vitals Entered By: Dreama Saa, CNA (December 31, 2009 1:43 PM)  Physical Exam  General:  Well developed, well nourished, in no acute distress. Head:  normocephalic and atraumatic Mouth:  Teeth, gums and palate normal. Oral mucosa normal. Lungs:  Clear bilaterally to auscultation and percussion. Heart:  Non-displaced PMI, chest non-tender; regular rate and rhythm, S1, S2 without murmurs, rubs or gallops. Carotid upstroke normal, no bruit. Normal abdominal aortic size, no bruits. Femorals normal pulses, no bruits. Pedals normal pulses. No edema, no varicosities. Abdomen:  Bowel sounds positive; abdomen soft and non-tender  without masses, organomegaly, or hernias noted. No hepatosplenomegaly. Msk:  Back normal, normal gait. Muscle strength and tone normal. Pulses:  pulses normal in all 4 extremities Extremities:  No clubbing or cyanosis. Neurologic:  Alert and oriented x 3. Psych:  Normal affect.   EKG  Procedure date:   12/31/2009  Findings:      Normal sinus rhythm with rate of:  69 bpm   Impression & Recommendations:  Problem # 1:  CHEST PAIN (ICD-786.50) The chest pain has not recurred, but she does have risk factors with family history and hypercholesterolemia.  A stress echo will be completed for evidence of ischemia. Her updated medication list for this problem includes:    Lisinopril 10 Mg Tabs (Lisinopril) .Marland Kitchen... 1 by mouth once daily  Orders: Stress Echo (Stress Echo)  Problem # 2:  HYPERLIPIDEMIA (ICD-272.4) Samples of Crestor 10mg  will be provided.  Teaching on low cholesterol diet is completed.  She may need evaluation for adult onset diabetes at primary care office. Her updated medication list for this problem includes:    Crestor 10 Mg Tabs (Rosuvastatin calcium) .Marland Kitchen... Take 1 tablet by mouth once a day samples  Patient Instructions: 1)  Your physician recommends that you schedule a follow-up appointment in: after tests 2)  Your physician recommends that you return for lab work in: 6 weeks 3)  Your physician has recommended you make the following change in your medication: crestor 10mg  daily samples lot # [id number] exp 12/13 4)  Your physician has requested that you have a stress echocardiogram. For further information please visit [email or web address]  Please follow instruction sheet as given.

## 2010-06-10 NOTE — Procedures (Signed)
Summary: NO SHOW LETTER  NO SHOW LETTER   Imported By: Lind Guest 01/22/2010 15:53:00  _____________________________________________________________________  External Attachment:    Type:   Image     Comment:   External Document

## 2010-06-10 NOTE — Progress Notes (Signed)
Summary: Prescription Needed  Phone Note Call from Patient   Caller: Patient Reason for Call: Refill Medication Summary of Call: patient states that she needs RX for Crestor called to [facility] in [place] Initial call taken by: Raechel Ache [facility],  February 12, 2010 9:35 AM    Prescriptions: CRESTOR 10 MG TABS (ROSUVASTATIN CALCIUM) Take 1 tablet by mouth once a day samples  #30 x 6   Entered by:   Teressa Lower RN   Authorized by:   Joni Reining, NP   Signed by:   Teressa Lower RN on 02/12/2010   Method used:   Electronically to        [facility] [address] (retail)       [address]       [place], [place]  [address]       Ph: [phone] or [phone]       Fax: [phone]   RxID:   [id number]

## 2010-06-10 NOTE — Assessment & Plan Note (Signed)
Summary: 2-3 WK F/U PER CHECKOUT ON 12/31/09/TG   Primary Provider:  Dr.Margaret Lodema Hong   History of Present Illness: Sara Woods is a 65 y/o CF that we are seeing on follow up for chest discomfort.  Sara Woods was scheduled for a stress echo. Sara Woods has no prior history of heart disease.  Sara Woods has a history of HTN, Hypercholesterolemia and anxiety.  Sara Woods has been without complaint with the exception of weight gain.  Current Medications (verified): 1)  Lisinopril 10 Mg  Tabs (Lisinopril) .Marland Kitchen.. 1 By Mouth Once Daily 2)  Vitamin D (Ergocalciferol) 50000 Unit Caps (Ergocalciferol) .... Take 1 Capsule Once A Week 3)  Doxepin Hcl 10 Mg Caps (Doxepin Hcl) .... Take 2 At Bedtime 4)  Loratadine 10 Mg Tabs (Loratadine) .... Take 1 Daily As Needed For Allergies 5)  Nasonex 50 Mcg/act Susp (Mometasone Furoate) .... Use 2 Sprays Each Nostril Once Daily 6)  Bactroban Nasal 2 % Oint (Mupirocin Calcium) .... Apply To Each Nostril As Directed 7)  Crestor 10 Mg Tabs (Rosuvastatin Calcium) .... Take 1 Tablet By Mouth Once A Day Samples 8)  Hydrochlorothiazide 25 Mg Tabs (Hydrochlorothiazide) .... Take One Tablet By Mouth Daily.  Allergies (verified): 1)  Penicillin 2)  Sulfa  Past History:  Past medical, surgical, family and social histories (including risk factors) reviewed, and no changes noted (except as noted below).  Past Medical History: Reviewed history from 12/23/2009 and no changes required. Allergic rhinitis Anxiety Hypertension arthritis migraines, hx skin cancer melanomas - Dr Orvan Falconer in [place] breast cancer sp breast lumpectomy  Recurrent MRSA abscesses  Past Surgical History: Reviewed history from 06/16/2007 and no changes required. tongue tumor skin cancer-back of left leg, right shoulder tonsils-childhood  Family History: Reviewed history from 07/29/2007 and no changes required. father-deceased-84-head trauma requiring craniotomy, metastatic prostate cancer-12/07 mother-80-brain tumors,  CVD, osteoporosis-12/04 brother-CVD sister-obese, fibromyalgia son-42-stroke--vertebral artery dissection  Social History: Reviewed history from 07/26/2008 and no changes required. Married lives with spouse, who had been admitted for lumbar surgery SON had CVA in January of 2009 Never Smoked Alcohol use-no Drug use-no  Review of Systems       All other systems have been reviewed and are negative unless stated above.   Vital Signs:  Patient profile:   65 year old female Height:      67 inches Weight:      187 pounds BMI:     29.39 Pulse rate:   78 / minute Resp:     16 per minute BP sitting:   149 / 91  (left arm)  Vitals Entered By: Marrion Coy, CNA (January 21, 2010 11:27 AM)  Physical Exam  General:  Well developed, well nourished, in no acute distress. Lungs:  Clear bilaterally to auscultation and percussion. Heart:  Non-displaced PMI, chest non-tender; regular rate and rhythm, S1, S2 without murmurs, rubs or gallops. Carotid upstroke normal, no bruit. Normal abdominal aortic size, no bruits. Femorals normal pulses, no bruits. Pedals normal pulses. No edema, no varicosities. Pulses:  pulses normal in all 4 extremities Psych:  Normal affect.   Impression & Recommendations:  Problem # 1:  CHEST PAIN (ICD-786.50) Stress echo read by Dr. Diona Browner on 01/17/2010 demonstrated no areas of ischemia or EKG changes.  Sara Woods was given these results with reassurance that her discomfort was probably not related to cardiac etiology. See on as needed basis. Her updated medication list for this problem includes:    Lisinopril 10 Mg Tabs (Lisinopril) .Marland Kitchen... 1 by mouth once daily  Problem # 2:  HYPERTENSION (ICD-401.9) Blood pressure is not optimal at this time.  Will add HCTZ 12.5mg  to current medications with BP goal, 130's systolically.  Sara Woods will follow-up with primary care provider for continued evaluation and see [place] on as needed basis. Her updated medication list for this problem  includes:    Lisinopril 10 Mg Tabs (Lisinopril) .Marland Kitchen... 1 by mouth once daily    Hydrochlorothiazide 25 Mg Tabs (Hydrochlorothiazide) .Marland Kitchen... Take one tablet by mouth daily.  Problem # 3:  ANXIETY (ICD-300.00) Sara Woods questions use of Doxepin causing weight gain.  I have printed off the drug information concerning this to give to patient to discuss further with primary care provider on follow-up.  Patient Instructions: 1)  Your physician recommends that you schedule a follow-up appointment in: as needed 2)  Your physician has recommended you make the following change in your medication: hydrochlorathiazide 25mg  daily Prescriptions: HYDROCHLOROTHIAZIDE 25 MG TABS (HYDROCHLOROTHIAZIDE) Take one tablet by mouth daily.  #30 x 3   Entered by:   Teressa Lower RN   Authorized by:   Joni Reining, NP   Signed by:   Teressa Lower RN on 01/21/2010   Method used:   Electronically to        [facility] [address] (retail)       [address]       [place], [place]  [address]       Ph: [phone] or [phone]       Fax: [phone]   RxID:   [id number]

## 2010-06-10 NOTE — Progress Notes (Signed)
  Phone Note Other Incoming   Caller: dr simpson Summary of Call: pls advise pt pap is abnormal, she need to have gynaefollow this, pls fax report, and sched appt with Dr Emelda Fear or Despina Hidden , this is impt Initial call taken by: Syliva Overman MD,  March 05, 2010 5:41 PM  Follow-up for Phone Call        PATIENT AWARE AND REFERAL MADE Follow-up by: Adella Hare LPN,  March 06, 2010 10:54 AM     Appended Document:  appt nov 4 12:45 @ [facility]

## 2010-06-12 NOTE — Letter (Signed)
Summary: DOSE  INCREASE  DOSE  INCREASE   Imported By: Lind Guest 05/29/2010 08:38:16  _____________________________________________________________________  External Attachment:    Type:   Image     Comment:   External Document

## 2010-06-15 DIAGNOSIS — M129 Arthropathy, unspecified: Secondary | ICD-10-CM | POA: Insufficient documentation

## 2010-06-15 DIAGNOSIS — R51 Headache: Secondary | ICD-10-CM | POA: Insufficient documentation

## 2010-06-15 DIAGNOSIS — R519 Headache, unspecified: Secondary | ICD-10-CM | POA: Insufficient documentation

## 2010-06-24 ENCOUNTER — Telehealth: Payer: Self-pay | Admitting: Family Medicine

## 2010-06-26 NOTE — Assessment & Plan Note (Signed)
Summary: office visit   Vital Signs:  Patient profile:   65 year old female Height:      67 inches Weight:      190 pounds BMI:     29.87 O2 Sat:      97 % Pulse rate:   83 / minute Pulse rhythm:   regular Resp:     16 per minute BP sitting:   138 / 90  (left arm) Cuff size:   regular  Vitals Entered By: Everitt Amber LPN (May 28, 2010 2:05 PM)  Nutrition Counseling: Patient's BMI is greater than 25 and therefore counseled on weight management options. CC: Follow up chronic problems, has bad migraines and she used to take vicodin at the onset to keep it from getting bad. She wants to know if she can get a prescription. Nasal congestions, ears stopped up, eyes tearing, greenish mucus   Primary Care Provider:  Dr.Dillion Stowers Lodema Hong  CC:  Follow up chronic problems, has bad migraines and she used to take vicodin at the onset to keep it from getting bad. She wants to know if she can get a prescription. Nasal congestions, ears stopped up, eyes tearing, and greenish mucus.  History of Present Illness: 1 week h/o head and chest congestion with frever and chills , also green sputum and green darainage.Generalised body aches, ,malaise, fatigue , generalised  body aches  Current Medications (verified): 1)  Lisinopril 10 Mg  Tabs (Lisinopril) .Marland Kitchen.. 1 By Mouth Once Daily 2)  Vitamin D (Ergocalciferol) 50000 Unit Caps (Ergocalciferol) .... Take 1 Capsule Once A Week 3)  Doxepin Hcl 10 Mg Caps (Doxepin Hcl) .... Take 2 At Bedtime 4)  Loratadine 10 Mg Tabs (Loratadine) .... Take 1 Daily As Needed For Allergies 5)  Bactroban Nasal 2 % Oint (Mupirocin Calcium) .... Apply To Each Nostril As Directed 6)  Crestor 10 Mg Tabs (Rosuvastatin Calcium) .... Take 1 Tablet By Mouth Once A Day Samples 7)  Hydrochlorothiazide 25 Mg Tabs (Hydrochlorothiazide) .... Take One Tablet By Mouth Daily. 8)  Multivitamins  Tabs (Multiple Vitamin) .... Take 1 Tablet By Mouth Once A Day  Allergies (verified): 1)   Penicillin 2)  Sulfa  Past History:  Past medical, surgical, family and social histories (including risk factors) reviewed for relevance to current acute and chronic problems.  Past Medical History: Reviewed history from 12/23/2009 and no changes required. Allergic rhinitis Anxiety Hypertension arthritis migraines, hx skin cancer melanomas - Dr Orvan Falconer in [place] breast cancer sp breast lumpectomy  Recurrent MRSA abscesses  Past Surgical History: Reviewed history from 06/16/2007 and no changes required. tongue tumor skin cancer-back of left leg, right shoulder tonsils-childhood  Family History: Reviewed history from 07/29/2007 and no changes required. father-deceased-84-head trauma requiring craniotomy, metastatic prostate cancer-12/07 mother-80-brain tumors, CVD, osteoporosis-12/04 brother-CVD sister-obese, fibromyalgia son-42-stroke--vertebral artery dissection  Social History: Reviewed history from 07/26/2008 and no changes required. Married lives with spouse, who had been admitted for lumbar surgery SON had CVA in January of 2009 Never Smoked Alcohol use-no Drug use-no  Review of Systems      See HPI General:  Complains of fatigue. Eyes:  Complains of discharge; denies eye pain and red eye. ENT:  Complains of nasal congestion, postnasal drainage, and sinus pressure. CV:  Denies chest pain or discomfort, palpitations, and swelling of feet. Resp:  Complains of cough and sputum productive. GI:  Denies abdominal pain, constipation, diarrhea, nausea, and vomiting. GU:  Denies dysuria and urinary frequency. MS:  Complains of joint pain, stiffness,  and thoracic pain. Neuro:  Complains of headaches; migraine headaches inc with family stres of illness, has aura  with it, saye vicodin prevents /catches early. Psych:  Complains of anxiety, depression, and mental problems; denies panic attacks, suicidal thoughts/plans, thoughts of violence, and unusual visions or  sounds. Endo:  Denies cold intolerance, excessive hunger, excessive thirst, excessive urination, and heat intolerance. Heme:  Denies abnormal bruising and bleeding. Allergy:  Denies hives or rash and itching eyes.  Physical Exam  General:  Well-developed,well-nourished,in no acute distress; alert,appropriate and cooperative throughout examination HEENT: No facial asymmetry,  EOMI, frontal and maxillary sinus tenderness, TM's Clear, oropharynx  pink and moist.   Chest: decreased air entry, scattered crackles and wheezes  CVS: S1, S2, No murmurs, No S3.   Abd: Soft, Nontender.  MS: decreasd  ROM spine, hips, shoulders and knees.  Ext: No edema.   CNS: CN 2-12 intact, power tone and sensation normal throughout.   Skin: Intact, no visible lesions or rashes.  Psych: Good eye contact, normal affect.  Memory intact, anxious  appearing.    Impression & Recommendations:  Problem # 1:  HYPERLIPIDEMIA (ICD-272.4) Assessment Comment Only  The following medications were removed from the medication list:    Crestor 10 Mg Tabs (Rosuvastatin calcium) .Marland Kitchen... Take 1 tablet by mouth once a day samples Her updated medication list for this problem includes:    Pravastatin Sodium 40 Mg Tabs (Pravastatin sodium) .Marland Kitchen... Take 1 tab by mouth at bedtime Low fat dietdiscussed and encouraged  Labs Reviewed: SGOT: 20 (12/27/2009)   SGPT: 18 (12/27/2009)  Lipid Goals: Chol Goal: 200 (02/24/2010)   HDL Goal: 40 (02/24/2010)   LDL Goal: 130 (02/24/2010)   TG Goal: 150 (02/24/2010)  Prior 10 Yr Risk Heart Disease: Not enough information (02/24/2010)   HDL:38 (12/27/2009), 52 (07/16/2008)  LDL:See Comment mg/dL (04/54/0981), 191 (47/82/9562)  Chol:253 (12/27/2009), 235 (07/16/2008)  Trig:406 (12/27/2009), 113 (07/16/2008)  Problem # 2:  HYPERTENSION (ICD-401.9) Assessment: Deteriorated  The following medications were removed from the medication list:    Lisinopril 10 Mg Tabs (Lisinopril) .Marland Kitchen... 1 by mouth  once daily Her updated medication list for this problem includes:    Hydrochlorothiazide 25 Mg Tabs (Hydrochlorothiazide) .Marland Kitchen... Take one tablet by mouth daily.    Lisinopril 20 Mg Tabs (Lisinopril) .Marland Kitchen... Take 1 tablet by mouth once a day dose increase effective 05/28/2010  Orders: T-Basic Metabolic Panel [phone])  BP today: 138/90 Prior BP: 110/70 (02/24/2010)  Prior 10 Yr Risk Heart Disease: Not enough information (02/24/2010)  Labs Reviewed: K+: 4.1 (12/27/2009) Creat: : 0.75 (12/27/2009)   Chol: 253 (12/27/2009)   HDL: 38 (12/27/2009)   LDL: See Comment mg/dL (96/29/5284)   TG: 132 (12/27/2009)  Problem # 3:  ANXIETY (ICD-300.00) Assessment: Unchanged  Her updated medication list for this problem includes:    Doxepin Hcl 10 Mg Caps (Doxepin hcl) .Marland Kitchen... Take 2 at bedtime  Discussed medication use and relaxation techniques.   Problem # 4:  SINUSITIS, ACUTE (ICD-461.9) Assessment: Comment Only  Her updated medication list for this problem includes:    Bactroban Nasal 2 % Oint (Mupirocin calcium) .Marland Kitchen... Apply to each nostril as directed    Doxycycline Hyclate 100 Mg Caps (Doxycycline hyclate) .Marland Kitchen... Take 1 capsule by mouth two times a day    Tessalon Perles 100 Mg Caps (Benzonatate) .Marland Kitchen... Take 1 capsule by mouth three times a day  Problem # 5:  HEADACHE (ICD-784.0) Assessment: Comment Only  Her updated medication list for this problem includes:  Hydrocodone-acetaminophen 5-500 Mg Tabs (Hydrocodone-acetaminophen) ..... One tablet once daily as needed for severe migraine headache, maximum  of 10 tabs/month    Diclofenac Sodium 50 Mg Tbec (Diclofenac sodium) .Marland Kitchen... Take 1 tablet by mouth once a day as needed for joint pain  Problem # 6:  ARTHRITIS, GENERALIZED (ICD-716.99) Assessment: Comment Only diclofenac prescribed  Complete Medication List: 1)  Vitamin D (ergocalciferol) 50000 Unit Caps (Ergocalciferol) .... Take 1 capsule once a week 2)  Doxepin Hcl 10 Mg Caps  (Doxepin hcl) .... Take 2 at bedtime 3)  Loratadine 10 Mg Tabs (Loratadine) .... Take 1 daily as needed for allergies 4)  Bactroban Nasal 2 % Oint (Mupirocin calcium) .... Apply to each nostril as directed 5)  Hydrochlorothiazide 25 Mg Tabs (Hydrochlorothiazide) .... Take one tablet by mouth daily. 6)  Multivitamins Tabs (Multiple vitamin) .... Take 1 tablet by mouth once a day 7)  Doxycycline Hyclate 100 Mg Caps (Doxycycline hyclate) .... Take 1 capsule by mouth two times a day 8)  Tessalon Perles 100 Mg Caps (Benzonatate) .... Take 1 capsule by mouth three times a day 9)  Lisinopril 20 Mg Tabs (Lisinopril) .... Take 1 tablet by mouth once a day dose increase effective 05/28/2010 10)  Hydrocodone-acetaminophen 5-500 Mg Tabs (Hydrocodone-acetaminophen) .... One tablet once daily as needed for severe migraine headache, maximum  of 10 tabs/month 11)  Diclofenac Sodium 50 Mg Tbec (Diclofenac sodium) .... Take 1 tablet by mouth once a day as needed for joint pain 12)  Pravastatin Sodium 40 Mg Tabs (Pravastatin sodium) .... Take 1 tab by mouth at bedtime  Other Orders: T-Lipid Profile ([id number]) T-Hepatic Function [phone]) T- Hemoglobin A1C ([id number])  Patient Instructions: 1)  f/u in 6 weeks. 2)  Meds as discussed. 3)  Dose increase on lisinopril since your blood pressure is high. 4)  i will check on the mamogram and let you know if due. 5)  BMP prior to visit, ICD-9: 6)  Hepatic Panel prior to visit, ICD-9: 7)  Lipid Panel prior to visit, ICD-9: 8)  hBA1c    asap Prescriptions: PRAVASTATIN SODIUM 40 MG TABS (PRAVASTATIN SODIUM) Take 1 tab by mouth at bedtime  #30 x 4   Entered by:   Adella Hare LPN   Authorized by:   Syliva Overman MD   Signed by:   Adella Hare LPN on 62/95/2841   Method used:   Electronically to        [facility] [address] (retail)       [address]       [place], [place]  [address]       Ph: [phone] or  [phone]       Fax: [phone]   RxID:   [id number] DICLOFENAC SODIUM 50 MG TBEC (DICLOFENAC SODIUM) Take 1 tablet by mouth once a day as needed for joint pain  #30 x 3   Entered and Authorized by:   Syliva Overman MD   Signed by:   Syliva Overman MD on 05/28/2010   Method used:   Electronically to        [facility] [address] (retail)       [address]       [place], [place]  [address]       Ph: [phone] or [phone]       Fax: [phone]   RxID:   [id number] HYDROCODONE-ACETAMINOPHEN 5-500 MG  TABS (HYDROCODONE-ACETAMINOPHEN) one tablet once daily as needed for severe migraine headache, maximum  of 10 tabs/month  #10 x 0   Entered and Authorized by:   Syliva Overman MD   Signed by:   Syliva Overman MD on 05/28/2010   Method used:   Printed then faxed to ...       [facility] [address] (retail)       [address]       [place], [place]  [address]       Ph: [phone] or [phone]       Fax: [phone]   RxID:   [id number] LISINOPRIL 20 MG TABS (LISINOPRIL) Take 1 tablet by mouth once a day dose increase effective 05/28/2010  #30 x 3   Entered and Authorized by:   Syliva Overman MD   Signed by:   Syliva Overman MD on 05/28/2010   Method used:   Printed then faxed to ...       [facility] [address] (retail)       [address]       [place], [place]  [address]       Ph: [phone] or [phone]       Fax: [phone]   RxID:   [id number] TESSALON PERLES 100 MG CAPS (BENZONATATE) Take 1 capsule by mouth three times a day  #30 x 0   Entered and Authorized by:   Syliva Overman MD   Signed by:   Syliva Overman MD on 05/28/2010   Method used:   Electronically to        [facility] [address] (retail)       [address]       [place], [place]  [address]       Ph: [phone] or [phone]        Fax: [phone]   RxID:   [id number] DOXYCYCLINE HYCLATE 100 MG CAPS (DOXYCYCLINE HYCLATE) Take 1 capsule by mouth two times a day  #20 x 0   Entered and Authorized by:   Syliva Overman MD   Signed by:   Syliva Overman MD on 05/28/2010   Method used:   Electronically to        [facility] [address] (retail)       [address]       [place], [place]  [address]       Ph: [phone] or [phone]       Fax: [phone]   RxID:   [id number]    Orders Added: 1)  Est. Patient Level IV [[address]] 2)  T-Basic Metabolic Panel [[id number]] 3)  T-Lipid Profile [80061-22930] 4)  T-Hepatic Function [80076-22960] 5)  T- Hemoglobin A1C [83036-23375]

## 2010-07-02 NOTE — Progress Notes (Signed)
Summary: change med  Phone Note Call from Patient   Summary of Call: pt calling about changing crestor. [facility] Initial call taken by: Rudene Anda,  June 24, 2010 3:14 PM  Follow-up for Phone Call        med has already changed, advise her pls Follow-up by: Syliva Overman MD,  June 24, 2010 5:14 PM  Additional Follow-up for Phone Call Additional follow up Details #1::        patient aware Additional Follow-up by: Adella Hare LPN,  June 25, 2010 10:09 AM

## 2010-07-09 ENCOUNTER — Ambulatory Visit (INDEPENDENT_AMBULATORY_CARE_PROVIDER_SITE_OTHER): Payer: Medicare Other | Admitting: Family Medicine

## 2010-07-09 ENCOUNTER — Encounter: Payer: Self-pay | Admitting: Family Medicine

## 2010-07-09 DIAGNOSIS — I10 Essential (primary) hypertension: Secondary | ICD-10-CM

## 2010-07-09 DIAGNOSIS — F329 Major depressive disorder, single episode, unspecified: Secondary | ICD-10-CM

## 2010-07-09 DIAGNOSIS — E559 Vitamin D deficiency, unspecified: Secondary | ICD-10-CM

## 2010-07-09 DIAGNOSIS — R51 Headache: Secondary | ICD-10-CM

## 2010-07-09 DIAGNOSIS — F418 Other specified anxiety disorders: Secondary | ICD-10-CM | POA: Insufficient documentation

## 2010-07-15 ENCOUNTER — Encounter: Payer: Self-pay | Admitting: Internal Medicine

## 2010-07-22 NOTE — Letter (Signed)
Summary: [facility] TRIAGE  [facility] TRIAGE   Imported By: Rexene Alberts 07/15/2010 11:06:43  _____________________________________________________________________  External Attachment:    Type:   Image     Comment:   External Document  Appended Document: [facility] TRIAGE ok as is  Appended Document: [facility] TRIAGE Rx and instuctions mailed.

## 2010-07-22 NOTE — Assessment & Plan Note (Signed)
Summary: follow up 6 weeks   Vital Signs:  Patient profile:   65 year old female Height:      67 inches Weight:      188.25 pounds BMI:     29.59 O2 Sat:      98 % Pulse rate:   99 / minute Pulse rhythm:   regular Resp:     16 per minute BP sitting:   122 / 90  (left arm) Cuff size:   large  Vitals Entered By: Everitt Amber LPN (July 09, 2010 2:08 PM)  Nutrition Counseling: Patient's BMI is greater than 25 and therefore counseled on weight management options. CC: Follow up chronic problems   Primary Care Provider:  Dr.Aodhan Scheidt Lodema Hong  CC:  Follow up chronic problems.  History of Present Illness: Reports  that  she is doing fairly well Denies recent fever or chills. Denies sinus pressure, nasal congestion , ear pain or sore throat. Denies chest congestion, or cough productive of sputum. Denies chest pain, palpitations, PND, orthopnea or leg swelling. Denies abdominal pain, nausea, vomitting, diarrhea or constipation. Denies change in bowel movements or bloody stool. Denies dysuria , frequency, incontinence or hesitancy. Denies  joint pain, swelling, or reduced mobility. Denies  vertigo, seizures.  Denies  rash, lesions, or itch.     Current Medications (verified): 1)  Vitamin D (Ergocalciferol) 50000 Unit Caps (Ergocalciferol) .... Take 1 Capsule Once A Week 2)  Doxepin Hcl 10 Mg Caps (Doxepin Hcl) .... Take 2 At Bedtime 3)  Loratadine 10 Mg Tabs (Loratadine) .... Take 1 Daily As Needed For Allergies 4)  Bactroban Nasal 2 % Oint (Mupirocin Calcium) .... Apply To Each Nostril As Directed 5)  Hydrochlorothiazide 25 Mg Tabs (Hydrochlorothiazide) .... Take One Tablet By Mouth Daily. 6)  Multivitamins  Tabs (Multiple Vitamin) .... Take 1 Tablet By Mouth Once A Day 7)  Lisinopril 20 Mg Tabs (Lisinopril) .... Take 1 Tablet By Mouth Once A Day Dose Increase Effective 05/28/2010 8)  Hydrocodone-Acetaminophen 5-500 Mg Tabs (Hydrocodone-Acetaminophen) .... One Tablet Once  Daily As Needed For Severe Migraine Headache, Maximum  of 10 Tabs/month 9)  Diclofenac Sodium 50 Mg Tbec (Diclofenac Sodium) .... Take 1 Tablet By Mouth Once A Day As Needed For Joint Pain 10)  Pravastatin Sodium 40 Mg Tabs (Pravastatin Sodium) .... Take 1 Tab By Mouth At Bedtime  Allergies (verified): 1)  Penicillin 2)  Sulfa  Review of Systems      See HPI General:  Complains of fatigue. Eyes:  Denies discharge, eye pain, and red eye. Neuro:  Complains of headaches; occasional. Psych:  Complains of depression and easily tearful; denies suicidal thoughts/plans, thoughts of violence, and unusual visions or sounds; alot of personal stress with poor relationships with her family and illness also. Endo:  Denies cold intolerance, excessive hunger, excessive thirst, and excessive urination. Heme:  Denies abnormal bruising, bleeding, enlarge lymph nodes, and fevers. Allergy:  Denies hives or rash and itching eyes.  Physical Exam  General:  Well-developed,well-nourished,in no acute distress; alert,appropriate and cooperative throughout examination HEENT: No facial asymmetry,  EOMI, No sinus tenderness, TM's Clear, oropharynx  pink and moist.   Chest: Clear to auscultation bilaterally.  CVS: S1, S2, No murmurs, No S3.   Abd: Soft, Nontender.  MS: Adequate ROM spine, hips, shoulders and knees.  Ext: No edema.   CNS: CN 2-12 intact, power tone and sensation normal throughout.   Skin: Intact, no visible lesions or rashes.  Psych: Good eye contact, normal affect.  Memory intact, not anxious tearful at times and depressed appearing    Impression & Recommendations:  Problem # 1:  DEPRESSION (ICD-311) Assessment Comment Only  Her updated medication list for this problem includes:    Doxepin Hcl 10 Mg Caps (Doxepin hcl) .Marland Kitchen... Take 2 at bedtime    Fluoxetine Hcl 10 Mg Tabs (Fluoxetine hcl) .Marland Kitchen... Take 1 tablet by mouth once a day  Orders: Medicare Electronic Prescription [phone])  Problem  # 2:  HYPERTENSION (ICD-401.9) Assessment: Improved  Her updated medication list for this problem includes:    Hydrochlorothiazide 25 Mg Tabs (Hydrochlorothiazide) .Marland Kitchen... Take one tablet by mouth daily.    Lisinopril 20 Mg Tabs (Lisinopril) .Marland Kitchen... Take 1 tablet by mouth once a day dose increase effective 05/28/2010 Patient advised to follow low sodium diet rich in fruit and vegetables, and to commit to at least 30 minutes 5 days per week of regular exercise , to improve blood presure control.   Orders: Medicare Electronic Prescription [phone])  BP today: 122/90 Prior BP: 138/90 (05/28/2010)  Prior 10 Yr Risk Heart Disease: Not enough information (02/24/2010)  Labs Reviewed: K+: 4.0 (05/29/2010) Creat: : 0.80 (05/29/2010)   Chol: 208 (05/29/2010)   HDL: 48 (05/29/2010)   LDL: 127 (05/29/2010)   TG: 163 (05/29/2010)  Problem # 3:  HYPERLIPIDEMIA (ICD-272.4) Assessment: Improved  Her updated medication list for this problem includes:    Pravastatin Sodium 40 Mg Tabs (Pravastatin sodium) .Marland Kitchen... Take 1 tab by mouth at bedtime  Orders: T-Lipid Profile [id number] T-Hepatic Function [phone]) Low fat diet discussed and encouraged Labs Reviewed: SGOT: 23 (05/29/2010)   SGPT: 24 (05/29/2010)  Lipid Goals: Chol Goal: 200 (02/24/2010)   HDL Goal: 40 (02/24/2010)   LDL Goal: 130 (02/24/2010)   TG Goal: 150 (02/24/2010)  Prior 10 Yr Risk Heart Disease: Not enough information (02/24/2010)   HDL:48 (05/29/2010), 38 (12/27/2009)  LDL:127 (05/29/2010), See Comment mg/dL (13/24/4010)  UVOZ:366 (05/29/2010), 253 (12/27/2009)  Trig:163 (05/29/2010), 406 (12/27/2009)  Complete Medication List: 1)  Vitamin D (ergocalciferol) 50000 Unit Caps (Ergocalciferol) .... Take 1 capsule once a week 2)  Doxepin Hcl 10 Mg Caps (Doxepin hcl) .... Take 2 at bedtime 3)  Loratadine 10 Mg Tabs (Loratadine) .... Take 1 daily as needed for allergies 4)  Bactroban Nasal 2 % Oint (Mupirocin calcium) .... Apply to  each nostril as directed 5)  Hydrochlorothiazide 25 Mg Tabs (Hydrochlorothiazide) .... Take one tablet by mouth daily. 6)  Multivitamins Tabs (Multiple vitamin) .... Take 1 tablet by mouth once a day 7)  Lisinopril 20 Mg Tabs (Lisinopril) .... Take 1 tablet by mouth once a day dose increase effective 05/28/2010 8)  Hydrocodone-acetaminophen 5-500 Mg Tabs (Hydrocodone-acetaminophen) .... One tablet once daily as needed for severe migraine headache, maximum  of 10 tabs/month 9)  Diclofenac Sodium 50 Mg Tbec (Diclofenac sodium) .... Take 1 tablet by mouth once a day as needed for joint pain 10)  Pravastatin Sodium 40 Mg Tabs (Pravastatin sodium) .... Take 1 tab by mouth at bedtime 11)  Fluoxetine Hcl 10 Mg Tabs (Fluoxetine hcl) .... Take 1 tablet by mouth once a day  Other Orders: T-Vitamin D (25-Hydroxy) ([id number]) Gastroenterology Referral (GI)  Patient Instructions: 1)  Please schedule a follow-up appointment in 3.5 months. 2)  Hepatic Panel prior to visit, ICD-9: 3)  Lipid Panel prior to visit, ICD-9:  in 3.5 months 4)  vit D  Prescriptions: HYDROCHLOROTHIAZIDE 25 MG TABS (HYDROCHLOROTHIAZIDE) Take one tablet by mouth daily.  #30 x 3  Entered by:   Adella Hare LPN   Authorized by:   Syliva Overman MD   Signed by:   Adella Hare LPN on 30/86/5784   Method used:   Printed then faxed to ...       [facility] [address] (retail)       [address]       [place], [place]  [address]       Ph: [phone] or [phone]       Fax: [phone]   RxID:   [id number] FLUOXETINE HCL 10 MG TABS (FLUOXETINE HCL) Take 1 tablet by mouth once a day  #30 x 3   Entered and Authorized by:   Syliva Overman MD   Signed by:   Syliva Overman MD on 07/09/2010   Method used:   Electronically to        [facility] [address] (retail)       [address]       [place], [place]  [address]       Ph: [phone] or  [phone]       Fax: [phone]   RxID:   [id number]    Orders Added: 1)  Est. Patient Level IV [26948] 2)  T-Lipid Profile [[id number]] 3)  T-Hepatic Function [80076-22960] 4)  T-Vitamin D (25-Hydroxy) [phone] 5)  Gastroenterology Referral [GI] 6)  Medicare Electronic Prescription [phone]

## 2010-08-05 ENCOUNTER — Encounter: Payer: Medicare Other | Admitting: Internal Medicine

## 2010-08-05 ENCOUNTER — Ambulatory Visit (HOSPITAL_COMMUNITY)
Admission: RE | Admit: 2010-08-05 | Discharge: 2010-08-05 | Disposition: A | Discharge status: Home / Self Care | Payer: Medicare Other | Source: Ambulatory Visit | Attending: Internal Medicine | Admitting: Internal Medicine

## 2010-08-05 DIAGNOSIS — Z1211 Encounter for screening for malignant neoplasm of colon: Secondary | ICD-10-CM

## 2010-08-05 DIAGNOSIS — E785 Hyperlipidemia, unspecified: Secondary | ICD-10-CM | POA: Insufficient documentation

## 2010-08-05 DIAGNOSIS — K644 Residual hemorrhoidal skin tags: Secondary | ICD-10-CM | POA: Insufficient documentation

## 2010-08-05 DIAGNOSIS — I10 Essential (primary) hypertension: Secondary | ICD-10-CM | POA: Insufficient documentation

## 2010-08-05 DIAGNOSIS — Z79899 Other long term (current) drug therapy: Secondary | ICD-10-CM | POA: Insufficient documentation

## 2010-08-05 DIAGNOSIS — K648 Other hemorrhoids: Secondary | ICD-10-CM | POA: Insufficient documentation

## 2010-08-05 DIAGNOSIS — K5730 Diverticulosis of large intestine without perforation or abscess without bleeding: Secondary | ICD-10-CM

## 2010-08-11 ENCOUNTER — Other Ambulatory Visit: Payer: Self-pay | Admitting: Family Medicine

## 2010-08-11 LAB — LIPID PANEL
Cholesterol: 185 mg/dL (ref 0–200)
Triglycerides: 210 mg/dL — ABNORMAL HIGH (ref <150)

## 2010-08-11 LAB — HEPATIC FUNCTION PANEL
ALT: 17 U/L (ref 0–35)
AST: 21 U/L (ref 0–37)
Alkaline Phosphatase: 68 U/L (ref 39–117)
Bilirubin, Direct: 0.1 mg/dL (ref 0.0–0.3)
Indirect Bilirubin: 0.3 mg/dL (ref 0.0–0.9)
Total Protein: 7 g/dL (ref 6.0–8.3)

## 2010-08-12 LAB — VITAMIN D 25 HYDROXY (VIT D DEFICIENCY, FRACTURES): Vit D, 25-Hydroxy: 55 ng/mL (ref 30–89)

## 2010-08-19 NOTE — Op Note (Signed)
  NAME:  Sara Woods, OTTAWAY                ACCOUNT NO.:  [id number]  MEDICAL RECORD NO.:  [id number]           PATIENT TYPE:  O  LOCATION:  [facility]                          FACILITY:  [facility]  PHYSICIAN:  R. Roetta Sessions, M.D. DATE OF BIRTH:  [birth year]  DATE OF PROCEDURE:  08/05/2010 DATE OF DISCHARGE:                              OPERATIVE REPORT   COLONOSCOPY  INDICATIONS FOR PROCEDURE:  A 65 year old lady referred at courtesy of Dr. Syliva Overman for her first ever colonoscopy.  She is devoid of any lower GI tract symptoms.  No family history of colon polyps or colon cancer.  Colonoscopy is now being done as standard screening maneuver. Risks, benefits, limitations, alternatives, imponderables have been discussed, questions answered.  Please see documentation in the medical record.  PROCEDURE NOTE:  O2 saturation, blood pressure, pulse, respirations were monitored throughout the entire procedure.  CONSCIOUS SEDATION:  Versed 4 mg IV Demerol 100 mg IV in divided doses.  INSTRUMENT:  Pentax video chip system.  FINDINGS:  Digital rectal exam revealed external hemorrhoidal tags, otherwise negative.  Endoscopic findings:  Prep was adequate.  Colon: Colonic mucosa was surveyed from the rectosigmoid junction through the left transverse right colon to the appendiceal orifice, ileocecal valve/cecum.  These structures were well seen and photographed for the record.  From this level, scope was slowly and cautiously withdrawn. All previously mentioned mucosal surfaces were again seen.  The patient had tortuous left colon requiring external abdominal pressure to facilitate scope advancement.  The patient was noted to have extensive left-sided diverticula, however, the remainder of colonic mucosa appeared normal.  Scope was pulled down the rectum where a thorough examination of rectal mucosa including retroflexed view of the anal verge demonstrated only internal hemorrhoids and anal papilla.   The patient tolerated the procedure well.  Cecal withdrawal time 10 minutes.  IMPRESSION:  External, internal hemorrhoids and anal papilla otherwise normal rectum, tortuous left colon, left-sided diverticula and colonic mucosa appeared normal.  RECOMMENDATIONS: 1. Diverticulosis literature provided to Ms. Daphine Deutscher. 2. Repeat screening colonoscopy in 10 years.     Jonathon Bellows, M.D.     RMR/MEDQ  D:  08/05/2010  T:  08/05/2010  Job:  [id number]  cc:   Milus Mallick. Lodema Hong, M.D. Fax: [phone]  Electronically Signed by Lorrin Goodell M.D. on 08/19/2010 02:57:32 PM

## 2010-08-21 ENCOUNTER — Telehealth: Payer: Self-pay | Admitting: Family Medicine

## 2010-09-04 ENCOUNTER — Telehealth: Payer: Self-pay

## 2010-09-04 MED ORDER — HYDROCODONE-ACETAMINOPHEN 5-500 MG PO TABS: 1.0000 | ORAL_TABLET | Freq: Every day | ORAL | Status: DC | PRN

## 2010-09-04 NOTE — Telephone Encounter (Signed)
Patient was VERY upset over having to wait so long for her refill. It was only written for 10 with no refill so I thought it was a one time med. She said you know she has terrible migraines and she has been suffering for 3 weeks now with nothing. I assured her as soon as I talked to you I would send in her refill. [facility].

## 2010-09-04 NOTE — Telephone Encounter (Signed)
Already filled and faxed in

## 2010-09-04 NOTE — Telephone Encounter (Signed)
Called patient, left message.

## 2010-09-04 NOTE — Telephone Encounter (Signed)
Refill x 2 and advise needs appt ion the next 2 months

## 2010-09-10 NOTE — Telephone Encounter (Signed)
error 

## 2010-09-30 ENCOUNTER — Other Ambulatory Visit: Payer: Self-pay | Admitting: Family Medicine

## 2010-10-20 ENCOUNTER — Encounter: Payer: Self-pay | Admitting: Family Medicine

## 2010-10-22 ENCOUNTER — Other Ambulatory Visit: Payer: Self-pay | Admitting: Family Medicine

## 2010-10-23 ENCOUNTER — Encounter: Payer: Self-pay | Admitting: Family Medicine

## 2010-10-27 ENCOUNTER — Ambulatory Visit: Payer: Medicare Other | Admitting: Family Medicine

## 2010-10-27 ENCOUNTER — Encounter: Payer: Self-pay | Admitting: Family Medicine

## 2010-10-28 ENCOUNTER — Encounter: Payer: Self-pay | Admitting: Family Medicine

## 2010-11-29 ENCOUNTER — Other Ambulatory Visit: Payer: Self-pay | Admitting: Family Medicine

## 2010-12-05 ENCOUNTER — Telehealth: Payer: Self-pay

## 2010-12-08 ENCOUNTER — Other Ambulatory Visit: Payer: Self-pay | Admitting: Family Medicine

## 2010-12-08 DIAGNOSIS — G4700 Insomnia, unspecified: Secondary | ICD-10-CM

## 2010-12-08 MED ORDER — DOXEPIN HCL 10 MG PO CAPS: 10.0000 mg | ORAL_CAPSULE | Freq: Every day | ORAL | Status: DC

## 2010-12-08 NOTE — Telephone Encounter (Signed)
Med sent in.

## 2010-12-08 NOTE — Telephone Encounter (Signed)
Script for one capsule at night only, has been sent in, pls let her know, no changes  Will be made in the script without oV

## 2010-12-10 ENCOUNTER — Telehealth: Payer: Self-pay | Admitting: Family Medicine

## 2010-12-10 DIAGNOSIS — G4700 Insomnia, unspecified: Secondary | ICD-10-CM

## 2010-12-10 MED ORDER — DOXEPIN HCL 10 MG PO CAPS: ORAL_CAPSULE | ORAL | Status: DC

## 2010-12-10 NOTE — Telephone Encounter (Signed)
Med sent as requested 

## 2010-12-18 ENCOUNTER — Encounter: Payer: Self-pay | Admitting: Family Medicine

## 2010-12-19 ENCOUNTER — Encounter: Payer: Self-pay | Admitting: Family Medicine

## 2010-12-19 ENCOUNTER — Ambulatory Visit (INDEPENDENT_AMBULATORY_CARE_PROVIDER_SITE_OTHER): Payer: Medicare Other | Admitting: Family Medicine

## 2010-12-19 VITALS — BP 146/84 | HR 81 | Resp 16 | Ht 65.5 in | Wt 188.0 lb

## 2010-12-19 DIAGNOSIS — R5383 Other fatigue: Secondary | ICD-10-CM

## 2010-12-19 DIAGNOSIS — R51 Headache: Secondary | ICD-10-CM

## 2010-12-19 DIAGNOSIS — J309 Allergic rhinitis, unspecified: Secondary | ICD-10-CM

## 2010-12-19 DIAGNOSIS — E785 Hyperlipidemia, unspecified: Secondary | ICD-10-CM

## 2010-12-19 DIAGNOSIS — F3289 Other specified depressive episodes: Secondary | ICD-10-CM

## 2010-12-19 DIAGNOSIS — Z87898 Personal history of other specified conditions: Secondary | ICD-10-CM

## 2010-12-19 DIAGNOSIS — I10 Essential (primary) hypertension: Secondary | ICD-10-CM

## 2010-12-19 DIAGNOSIS — L0390 Cellulitis, unspecified: Secondary | ICD-10-CM

## 2010-12-19 DIAGNOSIS — Z23 Encounter for immunization: Secondary | ICD-10-CM

## 2010-12-19 DIAGNOSIS — R5381 Other malaise: Secondary | ICD-10-CM

## 2010-12-19 DIAGNOSIS — L0291 Cutaneous abscess, unspecified: Secondary | ICD-10-CM

## 2010-12-19 DIAGNOSIS — F329 Major depressive disorder, single episode, unspecified: Secondary | ICD-10-CM

## 2010-12-19 MED ORDER — ZOSTER VACCINE LIVE 19400 UNT/0.65ML ~~LOC~~ SOLR: 0.6500 mL | Freq: Once | SUBCUTANEOUS | Status: DC

## 2010-12-19 MED ORDER — LISINOPRIL 30 MG PO TABS: 30.0000 mg | ORAL_TABLET | Freq: Every day | ORAL | Status: DC

## 2010-12-19 MED ORDER — FLUOXETINE HCL 20 MG PO CAPS: 20.0000 mg | ORAL_CAPSULE | Freq: Every day | ORAL | Status: DC

## 2010-12-19 MED ORDER — FLUTICASONE PROPIONATE 50 MCG/ACT NA SUSP: 1.0000 | Freq: Every day | NASAL | Status: DC

## 2010-12-19 MED ORDER — TOPIRAMATE 25 MG PO TABS: 25.0000 mg | ORAL_TABLET | Freq: Every day | ORAL | Status: DC

## 2010-12-19 NOTE — Assessment & Plan Note (Signed)
Medication compliance addressed. Commitment to regular exercise and healthy  food choices, with portion control discussed. DASH diet and low fat diet discussed and literature offered. Changes in medication made at this visit.  

## 2010-12-19 NOTE — Patient Instructions (Addendum)
F/u in 3 months.  Dose increase on fluoxetine.  Try to limit doxepin to a max of 2 at night.   New med topamax for preventing migraines , take every day as prescribed.  Plan to limit vicodin to 10 per month for bad headache.  Dose increase on lisinopril 20mg   to ONE and a HALF once daily, your blood pressure is high, new med is lisinopril 30mg  ONE daily  Fasting labs 3 days before vist, cbc, chem 7 and TSH.lipid, hepatic  LABWORK  NEEDS TO BE DONE BETWEEN 3 TO 7 DAYS BEFORE YOUR NEXT SCEDULED  VISIT.  THIS WILL IMPROVE THE QUALITY OF YOUR CARE.  Discontinue prescription vit D when you have finished this prescription   A healthy diet is rich in fruit, vegetables and whole grains. Poultry fish, nuts and beans are a healthy choice for protein rather then red meat. A low sodium diet and drinking 64 ounces of water daily is generally recommended. Oils and sweet should be limited. Carbohydrates especially for those who are diabetic or overweight, should be limited to 34-45 gram per meal. It is important to eat on a regular schedule, at least 3 times daily. Snacks should be primarily fruits, vegetables or nuts. It is important that you exercise regularly at least 30 minutes 5 times a week. If you develop chest pain, have severe difficulty breathing, or feel very tired, stop exercising immediately and seek medical attention

## 2010-12-20 NOTE — Assessment & Plan Note (Signed)
Uncontrolled flonase  prescribed

## 2010-12-20 NOTE — Assessment & Plan Note (Signed)
Improved on fluoxetine, will increase dose

## 2010-12-20 NOTE — Progress Notes (Signed)
  Subjective:    Patient ID: Sara Woods, female    DOB: [birth year], 65 y.o.   MRN: [id number]  HPI The PT is here for follow up and re-evaluation of chronic medical conditions, medication management and review of any available recent lab and radiology data.  Preventive health is updated, specifically  Cancer screening and Immunization.   Questions or concerns regarding consultations or procedures which the PT has had in the interim are  addressed. The PT denies any adverse reactions to current medications since the last visit.  Requests vaccines that are due      Review of Systems Denies recent fever or chills. Denies sinus pressure, ear pain or sore throat.c/o increased and uncontrolled allergy symptoms of nasal congestion with clea drainage and excessive sneezing and watery eyes Denies chest congestion, productive cough or wheezing. Denies chest pains, palpitations and leg swelling Denies abdominal pain, nausea, vomiting,diarrhea or constipation.   Denies dysuria, frequency, hesitancy or incontinence. Denies joint pain, swelling and limitation in mobility. Denies  seizures, numbness, or tingling.Reports increased frequency of migraines , on an almost daily basis, no new or specific triggers identified Improved depression and  anxiety ,insomnia responds to doxepin. Still has anger and pain which she is not interested in seeing a therapist for Denies skin break down or rash.        Objective:   Physical Exam Patient alert and oriented and in no cardiopulmonary distress.  HEENT: No facial asymmetry, EOMI, no sinus tenderness,  oropharynx pink and moist.  Neck supple no adenopathy.  Chest: Clear to auscultation bilaterally.  CVS: S1, S2 no murmurs, no S3.  ABD: Soft non tender. Bowel sounds normal.  Ext: No edema  MS: Adequate ROM spine, shoulders, hips and knees.  Skin: Intact, no ulcerations or rash noted.  Psych: Good eye contact, normal affect. Memory intact not  anxious, depressed appearing and tearful at times, espescialy when  discussing the suffering of her son. Not suicidal or homicidal  CNS: CN 2-12 intact, power, tone and sensation normal throughout.        Assessment & Plan:

## 2010-12-20 NOTE — Assessment & Plan Note (Signed)
Uncontrolled , start prophylactic medication and limit vicodin

## 2011-01-02 ENCOUNTER — Other Ambulatory Visit: Payer: Self-pay | Admitting: Family Medicine

## 2011-01-05 ENCOUNTER — Other Ambulatory Visit: Payer: Self-pay | Admitting: Family Medicine

## 2011-02-12 ENCOUNTER — Other Ambulatory Visit: Payer: Self-pay | Admitting: Family Medicine

## 2011-03-13 ENCOUNTER — Ambulatory Visit: Payer: Medicare Other | Admitting: Family Medicine

## 2011-03-21 ENCOUNTER — Other Ambulatory Visit: Payer: Self-pay | Admitting: Family Medicine

## 2011-03-24 LAB — CBC WITH DIFFERENTIAL/PLATELET
Basophils Absolute: 0 10*3/uL (ref 0.0–0.1)
Eosinophils Absolute: 0 10*3/uL (ref 0.0–0.7)
Eosinophils Relative: 1 % (ref 0–5)
HCT: 38.8 % (ref 36.0–46.0)
Lymphocytes Relative: 30 % (ref 12–46)
MCH: 30.3 pg (ref 26.0–34.0)
MCV: 94.9 fL (ref 78.0–100.0)
Monocytes Absolute: 0.2 10*3/uL (ref 0.1–1.0)
Platelets: 328 10*3/uL (ref 150–400)
RDW: 12.8 % (ref 11.5–15.5)
WBC: 4.6 10*3/uL (ref 4.0–10.5)

## 2011-03-24 LAB — HEPATIC FUNCTION PANEL
AST: 23 U/L (ref 0–37)
Alkaline Phosphatase: 68 U/L (ref 39–117)
Indirect Bilirubin: 0.4 mg/dL (ref 0.0–0.9)
Total Bilirubin: 0.5 mg/dL (ref 0.3–1.2)

## 2011-03-24 LAB — LIPID PANEL
LDL Cholesterol: 112 mg/dL — ABNORMAL HIGH (ref 0–99)
Triglycerides: 166 mg/dL — ABNORMAL HIGH (ref <150)

## 2011-03-24 LAB — BASIC METABOLIC PANEL
BUN: 15 mg/dL (ref 6–23)
CO2: 25 mEq/L (ref 19–32)
Calcium: 10 mg/dL (ref 8.4–10.5)
Chloride: 104 mEq/L (ref 96–112)
Creat: 0.93 mg/dL (ref 0.50–1.10)

## 2011-03-30 ENCOUNTER — Encounter: Payer: Self-pay | Admitting: Family Medicine

## 2011-04-06 ENCOUNTER — Ambulatory Visit (INDEPENDENT_AMBULATORY_CARE_PROVIDER_SITE_OTHER): Payer: Medicare Other | Admitting: Family Medicine

## 2011-04-06 ENCOUNTER — Encounter: Payer: Self-pay | Admitting: Family Medicine

## 2011-04-06 VITALS — BP 120/78 | HR 107 | Resp 16 | Ht 65.5 in | Wt 175.1 lb

## 2011-04-06 DIAGNOSIS — J019 Acute sinusitis, unspecified: Secondary | ICD-10-CM

## 2011-04-06 DIAGNOSIS — F329 Major depressive disorder, single episode, unspecified: Secondary | ICD-10-CM

## 2011-04-06 DIAGNOSIS — R51 Headache: Secondary | ICD-10-CM

## 2011-04-06 DIAGNOSIS — J320 Chronic maxillary sinusitis: Secondary | ICD-10-CM

## 2011-04-06 DIAGNOSIS — J40 Bronchitis, not specified as acute or chronic: Secondary | ICD-10-CM

## 2011-04-06 DIAGNOSIS — E785 Hyperlipidemia, unspecified: Secondary | ICD-10-CM

## 2011-04-06 DIAGNOSIS — I10 Essential (primary) hypertension: Secondary | ICD-10-CM

## 2011-04-06 DIAGNOSIS — G43909 Migraine, unspecified, not intractable, without status migrainosus: Secondary | ICD-10-CM

## 2011-04-06 DIAGNOSIS — L0291 Cutaneous abscess, unspecified: Secondary | ICD-10-CM

## 2011-04-06 MED ORDER — HYDROCHLOROTHIAZIDE 25 MG PO TABS: ORAL_TABLET | ORAL | Status: DC

## 2011-04-06 MED ORDER — PROMETHAZINE-DM 6.25-15 MG/5ML PO SYRP: ORAL_SOLUTION | ORAL | Status: DC

## 2011-04-06 MED ORDER — DOXYCYCLINE HYCLATE 100 MG PO TABS: 100.0000 mg | ORAL_TABLET | Freq: Two times a day (BID) | ORAL | Status: DC

## 2011-04-06 MED ORDER — BENZONATATE 100 MG PO CAPS: 100.0000 mg | ORAL_CAPSULE | Freq: Four times a day (QID) | ORAL | Status: DC | PRN

## 2011-04-06 MED ORDER — FLUTICASONE PROPIONATE 50 MCG/ACT NA SUSP: 1.0000 | Freq: Every day | NASAL | Status: DC

## 2011-04-06 MED ORDER — PROMETHAZINE-DM 6.25-15 MG/5ML PO SYRP: ORAL_SOLUTION | ORAL | Status: AC

## 2011-04-06 MED ORDER — HYDROCODONE-ACETAMINOPHEN 5-500 MG PO TABS: 1.0000 | ORAL_TABLET | Freq: Every day | ORAL | Status: DC | PRN

## 2011-04-06 MED ORDER — DOXYCYCLINE HYCLATE 100 MG PO TABS: 100.0000 mg | ORAL_TABLET | Freq: Two times a day (BID) | ORAL | Status: AC

## 2011-04-06 NOTE — Progress Notes (Signed)
  Subjective:    Patient ID: Sara Woods, female    DOB: [birth year], 65 y.o.   MRN: [id number]  HPI 5 weeek h/o sinus congestion, green nasal drainage, sore in nose, sore throat and cough productive of yellow sputum. Entire household is sick. Otherwise she has no new complaints. She is here for routine follow up  Of her chronic conditions as well as to review recent lab data  Review of Systems See HPI Denies chest pains, palpitations and leg swelling Denies abdominal pain, nausea, vomiting,diarrhea or constipation.   Denies dysuria, frequency, hesitancy or incontinence. Denies joint pain, swelling and limitation in mobility. Denies uncontrolled  Headaches,but states she has been out of her pain medication for several weeks, this she uses only rarely however, she denies seizures, numbness, or tingling. Denies uncontrolled depression, anxiety or insomnia.States medication has helped a lot. Denies skin break down or rash.        Objective:   Physical Exam  Patient alert and oriented and in no cardiopulmonary distress.Ill appearing  HEENT: No facial asymmetry, EOMI, frontal sinus tenderness,  oropharynx pink and moist.  Neck supple, anterior cervical  adenopathy.  Chest:decreased air entry, bilateral crackles , few wheezes  CVS: S1, S2 no murmurs, no S3.  ABD: Soft non tender. Bowel sounds normal.  Ext: No edema  MS: Adequate ROM spine, shoulders, hips and knees.  Skin: Intact, no ulcerations or rash noted.  Psych: Good eye contact, normal affect. Memory intact not anxious or depressed appearing.  CNS: CN 2-12 intact, power, tone and sensation normal throughout.       Assessment & Plan:

## 2011-04-06 NOTE — Patient Instructions (Addendum)
CPE in 4.5 months.   Congrats on weight loss, keep it up.  Cholesterol has increased , pls cut back on sugary foods and fried and fattty foods, also butter and cheese  Medication is sent in for sinusitis and bronchitis   Please schedule your mammogram this is past due  LABWORK  NEEDS TO BE DONE BETWEEN 3 TO 7 DAYS BEFORE YOUR NEXT SCEDULED  VISIT.  THIS WILL IMPROVE THE QUALITY OF YOUR CARE. Fasting lipid, hepatic and chem 7 in 4.5 months

## 2011-04-07 ENCOUNTER — Telehealth: Payer: Self-pay | Admitting: Family Medicine

## 2011-04-07 MED ORDER — FLUTICASONE PROPIONATE 50 MCG/ACT NA SUSP: 1.0000 | Freq: Every day | NASAL | Status: DC

## 2011-04-07 MED ORDER — BENZONATATE 100 MG PO CAPS: 100.0000 mg | ORAL_CAPSULE | Freq: Four times a day (QID) | ORAL | Status: DC | PRN

## 2011-04-07 MED ORDER — HYDROCHLOROTHIAZIDE 25 MG PO TABS: ORAL_TABLET | ORAL | Status: DC

## 2011-04-07 MED ORDER — HYDROCODONE-ACETAMINOPHEN 5-500 MG PO TABS: 1.0000 | ORAL_TABLET | Freq: Every day | ORAL | Status: DC | PRN

## 2011-04-07 NOTE — Telephone Encounter (Signed)
Resent in

## 2011-04-12 NOTE — Assessment & Plan Note (Signed)
Frequency and severity unchanged, will maintain pt on pain med as before on as needed basis with low pill count/month

## 2011-04-12 NOTE — Assessment & Plan Note (Signed)
Improved , but still uncontrolled, dietary change discussed and stressed, need to reduce fat intake on low fat diet

## 2011-04-12 NOTE — Assessment & Plan Note (Signed)
Acute sinusitis , will treat agresively with antibiotic course

## 2011-04-12 NOTE — Assessment & Plan Note (Signed)
Currently stable no change in medication

## 2011-04-12 NOTE — Assessment & Plan Note (Signed)
Controlled, no change in medication  

## 2011-04-12 NOTE — Assessment & Plan Note (Signed)
Acute bronchitis, antibiotic course prescribed

## 2011-04-30 ENCOUNTER — Encounter: Payer: Self-pay | Admitting: Cardiology

## 2011-06-20 ENCOUNTER — Other Ambulatory Visit: Payer: Self-pay | Admitting: Family Medicine

## 2011-06-23 ENCOUNTER — Other Ambulatory Visit: Payer: Self-pay | Admitting: Family Medicine

## 2011-07-23 ENCOUNTER — Other Ambulatory Visit: Payer: Self-pay | Admitting: Family Medicine

## 2011-07-27 DIAGNOSIS — I10 Essential (primary) hypertension: Secondary | ICD-10-CM | POA: Diagnosis not present

## 2011-07-27 DIAGNOSIS — E785 Hyperlipidemia, unspecified: Secondary | ICD-10-CM | POA: Diagnosis not present

## 2011-07-28 LAB — HEPATIC FUNCTION PANEL
Albumin: 4.4 g/dL (ref 3.5–5.2)
Alkaline Phosphatase: 70 U/L (ref 39–117)
Total Bilirubin: 0.5 mg/dL (ref 0.3–1.2)

## 2011-07-28 LAB — BASIC METABOLIC PANEL
CO2: 24 mEq/L (ref 19–32)
Calcium: 9.7 mg/dL (ref 8.4–10.5)
Creat: 0.86 mg/dL (ref 0.50–1.10)
Glucose, Bld: 92 mg/dL (ref 70–99)

## 2011-07-28 LAB — LIPID PANEL
HDL: 53 mg/dL (ref >39)
LDL Cholesterol: 95 mg/dL (ref 0–99)
VLDL: 20 mg/dL (ref 0–40)

## 2011-08-04 ENCOUNTER — Other Ambulatory Visit (HOSPITAL_COMMUNITY)
Admission: RE | Admit: 2011-08-04 | Discharge: 2011-08-04 | Disposition: A | Discharge status: Home / Self Care | Payer: Medicare Other | Source: Ambulatory Visit | Attending: Family Medicine | Admitting: Family Medicine

## 2011-08-04 ENCOUNTER — Encounter: Payer: Self-pay | Admitting: Family Medicine

## 2011-08-04 ENCOUNTER — Ambulatory Visit (INDEPENDENT_AMBULATORY_CARE_PROVIDER_SITE_OTHER): Payer: Medicare Other | Admitting: Family Medicine

## 2011-08-04 VITALS — BP 108/78 | HR 82 | Resp 16 | Ht 65.5 in | Wt 164.1 lb

## 2011-08-04 DIAGNOSIS — Z0000 Encounter for general adult medical examination without abnormal findings: Secondary | ICD-10-CM | POA: Diagnosis not present

## 2011-08-04 DIAGNOSIS — Z124 Encounter for screening for malignant neoplasm of cervix: Secondary | ICD-10-CM | POA: Diagnosis not present

## 2011-08-04 DIAGNOSIS — Z01419 Encounter for gynecological examination (general) (routine) without abnormal findings: Secondary | ICD-10-CM | POA: Diagnosis not present

## 2011-08-04 DIAGNOSIS — E785 Hyperlipidemia, unspecified: Secondary | ICD-10-CM

## 2011-08-04 DIAGNOSIS — I10 Essential (primary) hypertension: Secondary | ICD-10-CM

## 2011-08-04 DIAGNOSIS — J40 Bronchitis, not specified as acute or chronic: Secondary | ICD-10-CM

## 2011-08-04 DIAGNOSIS — J019 Acute sinusitis, unspecified: Secondary | ICD-10-CM

## 2011-08-04 DIAGNOSIS — Z1211 Encounter for screening for malignant neoplasm of colon: Secondary | ICD-10-CM

## 2011-08-04 MED ORDER — ERYTHROMYCIN BASE 250 MG PO TABS: 500.0000 mg | ORAL_TABLET | Freq: Three times a day (TID) | ORAL | Status: AC

## 2011-08-04 MED ORDER — BENZONATATE 100 MG PO CAPS: 100.0000 mg | ORAL_CAPSULE | Freq: Four times a day (QID) | ORAL | Status: DC | PRN

## 2011-08-04 NOTE — Patient Instructions (Addendum)
F/u in end September or early October.  Please call if you need me before  Medication is sent in for sore throat and cough.  Labs and blood pressure are excellent, and exam is otherwise normal.  We will schedule your mammogram on the way out, it is past due  Fasting lipid and cmp in October

## 2011-08-04 NOTE — Progress Notes (Signed)
  Subjective:    Patient ID: Sara Woods, female    DOB: [birth year], 65 y.o.   MRN: [id number]  HPI The PT is here for annual exam and re-evaluation of chronic medical conditions, medication management and review of any available recent lab and radiology data.  Preventive health is updated, specifically  Cancer screening and Immunization.   Questions or concerns regarding consultations or procedures which the PT has had in the interim are  addressed. The PT denies any adverse reactions to current medications since the last visit.  1 week h/o chest congestion and sore throat with yellow sputum      Review of Systems See HPI Denies recent fever or chills. Denies sinus pressure, nasal congestion, ear pain or sore throat. Denies chest pains, palpitations and leg swelling Denies abdominal pain, nausea, vomiting,diarrhea or constipation.   Denies dysuria, frequency, hesitancy or incontinence. Denies joint pain, swelling and limitation in mobility. Denies headaches, seizures, numbness, or tingling. Denies depression, anxiety or insomnia. Denies skin break down or rash.        Objective:   Physical Exam Pleasant well nourished female, alert and oriented x 3, in no cardio-pulmonary distress. Afebrile. HEENT No facial trauma or asymetry. Sinuses non tender.  EOMI, PERTL, fundoscopic exam is normal, no hemorhage or exudate.  External ears normal, tympanic membranes clear. Oropharynx erythematous, no exudate, good dentition. Neck: supple, no adenopathy,JVD or thyromegaly.No bruits.  Chest: Adequate air entry with scattered crackles. Non tender to palpation  Breast: No asymetry,no masses. No nipple discharge or inversion. No axillary or supraclavicular adenopathy  Cardiovascular system; Heart sounds normal,  S1 and  S2 ,no S3.  No murmur, or thrill. Apical beat not displaced Peripheral pulses normal.  Abdomen: Soft, non tender, no organomegaly or masses. No bruits. Bowel  sounds normal. No guarding, tenderness or rebound.  Rectal:  No mass. Guaiac negative stool.  GU: External genitalia normal. No lesions. Vaginal canal normal.No discharge. Uterus normal size, no adnexal masses, no cervical motion or adnexal tenderness.  Musculoskeletal exam: Full ROM of spine, hips , shoulders and knees. No deformity ,swelling or crepitus noted. No muscle wasting or atrophy.   Neurologic: Cranial nerves 2 to 12 intact. Power, tone ,sensation and reflexes normal throughout. No disturbance in gait. No tremor.  Skin: Intact, no ulceration, erythema , scaling or rash noted. Pigmentation normal throughout  Psych; Normal mood and affect. Judgement and concentration normal        Assessment & Plan:

## 2011-08-04 NOTE — Assessment & Plan Note (Signed)
Acute bronchitis, antibiotic and decongestants prescribed

## 2011-08-04 NOTE — Assessment & Plan Note (Signed)
Controlled, no change in medication  

## 2011-08-05 MED ORDER — DICLOFENAC SODIUM 50 MG PO TBEC: DELAYED_RELEASE_TABLET | ORAL | Status: DC

## 2011-08-05 MED ORDER — TOPIRAMATE 25 MG PO TABS: ORAL_TABLET | ORAL | Status: DC

## 2011-08-05 MED ORDER — FLUOXETINE HCL 20 MG PO CAPS: ORAL_CAPSULE | ORAL | Status: DC

## 2011-08-05 NOTE — Progress Notes (Signed)
Addended by: Abner Greenspan on: 08/05/2011 08:51 AM   Modules accepted: Orders

## 2011-10-24 ENCOUNTER — Other Ambulatory Visit: Payer: Self-pay | Admitting: Family Medicine

## 2011-11-25 ENCOUNTER — Other Ambulatory Visit: Payer: Self-pay | Admitting: Family Medicine

## 2011-11-25 ENCOUNTER — Encounter: Payer: Self-pay | Admitting: Family Medicine

## 2011-11-25 ENCOUNTER — Ambulatory Visit (INDEPENDENT_AMBULATORY_CARE_PROVIDER_SITE_OTHER): Payer: Medicare Other | Admitting: Family Medicine

## 2011-11-25 VITALS — BP 124/80 | HR 84 | Temp 98.7°F | Resp 16 | Ht 65.5 in | Wt 158.0 lb

## 2011-11-25 DIAGNOSIS — R51 Headache: Secondary | ICD-10-CM | POA: Diagnosis not present

## 2011-11-25 DIAGNOSIS — E785 Hyperlipidemia, unspecified: Secondary | ICD-10-CM | POA: Diagnosis not present

## 2011-11-25 DIAGNOSIS — J019 Acute sinusitis, unspecified: Secondary | ICD-10-CM | POA: Diagnosis not present

## 2011-11-25 DIAGNOSIS — Z139 Encounter for screening, unspecified: Secondary | ICD-10-CM

## 2011-11-25 DIAGNOSIS — I10 Essential (primary) hypertension: Secondary | ICD-10-CM | POA: Diagnosis not present

## 2011-11-25 DIAGNOSIS — F329 Major depressive disorder, single episode, unspecified: Secondary | ICD-10-CM

## 2011-11-25 DIAGNOSIS — J40 Bronchitis, not specified as acute or chronic: Secondary | ICD-10-CM

## 2011-11-25 MED ORDER — TOPIRAMATE 25 MG PO TABS: 25.0000 mg | ORAL_TABLET | Freq: Two times a day (BID) | ORAL | Status: DC

## 2011-11-25 MED ORDER — LEVOFLOXACIN 500 MG PO TABS: 500.0000 mg | ORAL_TABLET | Freq: Every day | ORAL | Status: AC

## 2011-11-25 NOTE — Patient Instructions (Addendum)
Annual wellness exam in 3 month  You are treated  For sinusitis and bronchitis   Dose increase on topamax to reduce the headache frequency and severity  Mammogram to be scheduled at exit

## 2011-11-25 NOTE — Progress Notes (Signed)
  Subjective:    Patient ID: Sara Woods, female    DOB: 29-Apr-1946, 66 y.o.   MRN: [id number]  HPI 2 week h/o head and chest congestion yellow green nasal drainage and cough productive of green sputum, also intermittent fever and chills. Notes headache frequency on avg twice per week, will increase topamax rather than abortive med as she is under dosed with the topamax. Reports depression well controlled on medication, and she is active in her [employer] which helps a lot, she writes uplifting poetry  Review of Systems See HPI  Denies chest pains, palpitations and leg swelling Denies abdominal pain, nausea, vomiting,diarrhea or constipation.   Denies dysuria, frequency, hesitancy or incontinence. Denies joint pain, swelling and limitation in mobility.  Denies uncontrolled  depression, anxiety or insomnia. Denies skin break down or rash.        Objective:   Physical Exam  Patient alert and oriented and in no cardiopulmonary distress.  HEENT: No facial asymmetry, EOMI, maxillary  sinus tenderness,  oropharynx pink and moist.  Neck supple no adenopathy.  Chest: decreased air entry, scattered crackles and wheezes  CVS: S1, S2 no murmurs, no S3.  ABD: Soft non tender. Bowel sounds normal.  Ext: No edema  MS: Adequate ROM spine, shoulders, hips and knees.  Skin: Intact, no ulcerations or rash noted.  Psych: Good eye contact, normal affect. Memory intact not anxious or depressed appearing.  CNS: CN 2-12 intact, power, tone and sensation normal throughout.       Assessment & Plan:

## 2011-11-29 NOTE — Assessment & Plan Note (Signed)
Hyperlipidemia:Low fat diet discussed and encouraged.  Controlled, no change in medication   

## 2011-11-29 NOTE — Assessment & Plan Note (Signed)
Controlled, no change in medication  

## 2011-11-29 NOTE — Assessment & Plan Note (Signed)
Antibiotic and decongestant ptrescribed

## 2011-11-29 NOTE — Assessment & Plan Note (Signed)
Acute maxillary sinusitis, antibiotics prescribed

## 2011-11-29 NOTE — Assessment & Plan Note (Signed)
Uncontrolled, frequency averages 1 to 2 per week, increase dose of topamax

## 2011-11-30 ENCOUNTER — Ambulatory Visit (HOSPITAL_COMMUNITY)
Admission: RE | Admit: 2011-11-30 | Discharge: 2011-11-30 | Disposition: A | Discharge status: Home / Self Care | Payer: Medicare Other | Source: Ambulatory Visit | Attending: Family Medicine | Admitting: Family Medicine

## 2011-11-30 DIAGNOSIS — Z139 Encounter for screening, unspecified: Secondary | ICD-10-CM

## 2011-11-30 DIAGNOSIS — Z1231 Encounter for screening mammogram for malignant neoplasm of breast: Secondary | ICD-10-CM | POA: Diagnosis not present

## 2011-12-26 ENCOUNTER — Other Ambulatory Visit: Payer: Self-pay | Admitting: Family Medicine

## 2012-02-04 ENCOUNTER — Other Ambulatory Visit: Payer: Self-pay | Admitting: Family Medicine

## 2012-02-23 DIAGNOSIS — L570 Actinic keratosis: Secondary | ICD-10-CM | POA: Diagnosis not present

## 2012-02-23 DIAGNOSIS — L821 Other seborrheic keratosis: Secondary | ICD-10-CM | POA: Diagnosis not present

## 2012-02-23 DIAGNOSIS — D179 Benign lipomatous neoplasm, unspecified: Secondary | ICD-10-CM | POA: Diagnosis not present

## 2012-02-24 ENCOUNTER — Ambulatory Visit (INDEPENDENT_AMBULATORY_CARE_PROVIDER_SITE_OTHER): Payer: Medicare Other

## 2012-02-24 DIAGNOSIS — Z23 Encounter for immunization: Secondary | ICD-10-CM

## 2012-02-24 DIAGNOSIS — E785 Hyperlipidemia, unspecified: Secondary | ICD-10-CM

## 2012-02-24 DIAGNOSIS — I10 Essential (primary) hypertension: Secondary | ICD-10-CM

## 2012-02-29 ENCOUNTER — Emergency Department (HOSPITAL_COMMUNITY): Payer: Medicare Other

## 2012-02-29 ENCOUNTER — Emergency Department (HOSPITAL_COMMUNITY)
Admission: EM | Admit: 2012-02-29 | Discharge: 2012-02-29 | Disposition: A | Discharge status: Home / Self Care | Payer: Medicare Other | Attending: Emergency Medicine | Admitting: Emergency Medicine

## 2012-02-29 ENCOUNTER — Encounter (HOSPITAL_COMMUNITY): Payer: Self-pay | Admitting: *Deleted

## 2012-02-29 DIAGNOSIS — Z85828 Personal history of other malignant neoplasm of skin: Secondary | ICD-10-CM | POA: Diagnosis not present

## 2012-02-29 DIAGNOSIS — R5381 Other malaise: Secondary | ICD-10-CM | POA: Insufficient documentation

## 2012-02-29 DIAGNOSIS — R42 Dizziness and giddiness: Secondary | ICD-10-CM | POA: Insufficient documentation

## 2012-02-29 DIAGNOSIS — R6889 Other general symptoms and signs: Secondary | ICD-10-CM | POA: Diagnosis not present

## 2012-02-29 DIAGNOSIS — Z85820 Personal history of malignant melanoma of skin: Secondary | ICD-10-CM | POA: Diagnosis not present

## 2012-02-29 DIAGNOSIS — Z853 Personal history of malignant neoplasm of breast: Secondary | ICD-10-CM | POA: Insufficient documentation

## 2012-02-29 DIAGNOSIS — Z8659 Personal history of other mental and behavioral disorders: Secondary | ICD-10-CM | POA: Diagnosis not present

## 2012-02-29 DIAGNOSIS — I10 Essential (primary) hypertension: Secondary | ICD-10-CM | POA: Diagnosis not present

## 2012-02-29 DIAGNOSIS — I6789 Other cerebrovascular disease: Secondary | ICD-10-CM | POA: Diagnosis not present

## 2012-02-29 LAB — CBC WITH DIFFERENTIAL/PLATELET
Basophils Absolute: 0 10*3/uL (ref 0.0–0.1)
Eosinophils Absolute: 0 10*3/uL (ref 0.0–0.7)
Eosinophils Relative: 0 % (ref 0–5)
MCH: 31 pg (ref 26.0–34.0)
MCHC: 33.5 g/dL (ref 30.0–36.0)
MCV: 92.4 fL (ref 78.0–100.0)
Platelets: 349 10*3/uL (ref 150–400)
RDW: 12.7 % (ref 11.5–15.5)

## 2012-02-29 LAB — COMPREHENSIVE METABOLIC PANEL
ALT: 31 U/L (ref 0–35)
AST: 30 U/L (ref 0–37)
Calcium: 10 mg/dL (ref 8.4–10.5)
GFR calc Af Amer: 67 mL/min — ABNORMAL LOW (ref >90)
Glucose, Bld: 95 mg/dL (ref 70–99)
Sodium: 136 mEq/L (ref 135–145)
Total Protein: 7.2 g/dL (ref 6.0–8.3)

## 2012-02-29 MED ORDER — MECLIZINE HCL 12.5 MG PO TABS
25.0000 mg | ORAL_TABLET | Freq: Once | ORAL | Status: AC
  Administered 2012-02-29: 25 mg via ORAL
  Filled 2012-02-29: qty 2

## 2012-02-29 MED ORDER — SODIUM CHLORIDE 0.9 % IV BOLUS (SEPSIS)
1000.0000 mL | Freq: Once | INTRAVENOUS | Status: AC
  Administered 2012-02-29: 1000 mL via INTRAVENOUS

## 2012-02-29 MED ORDER — ONDANSETRON HCL 4 MG/2ML IJ SOLN
4.0000 mg | Freq: Once | INTRAMUSCULAR | Status: AC
  Administered 2012-02-29: 4 mg via INTRAVENOUS
  Filled 2012-02-29: qty 2

## 2012-02-29 MED ORDER — MECLIZINE HCL 25 MG PO TABS: 25.0000 mg | ORAL_TABLET | Freq: Three times a day (TID) | ORAL | Status: DC | PRN

## 2012-02-29 NOTE — ED Provider Notes (Signed)
History     CSN: [id number]  Arrival date & time 02/29/12  1637   First MD Initiated Contact with Patient 02/29/12 1709      Chief Complaint  Patient presents with  . Dizziness    (Consider location/radiation/quality/duration/timing/severity/associated sxs/prior treatment) HPI Comments: Patient woke from sleep this morning with the sensation of dizziness, spinning.  She has felt nauseated and this has worsened throughout the day.  No headaches, chest pain, shortness of breath.  Worse with movement, turning head.  Better with rest.  Never happened before.  Patient is a 66 y.o. female presenting with weakness. The history is provided by the patient.  Weakness The primary symptoms include nausea. Primary symptoms comment: dizziness Episode onset: this morning. The symptoms are worsening. The neurological symptoms are diffuse.  Additional symptoms include weakness. Additional symptoms do not include neck stiffness.    Past Medical History  Diagnosis Date  . Allergic rhinitis   . Anxiety   . Hypertension   . Arthritis   . History of migraines   . Skin cancer (melanoma)     Dr Orvan Falconer in [place]  . Breast cancer   . MRSA infection     Recurrent abcesses    Past Surgical History  Procedure Date  . Tumor excision     Tongue  . Skin cancer destruction     Back of left leg, right shoulder  . Tonsillectomy     Childhood    Family History  Problem Relation Age of Onset  . Prostate cancer Father     metastic, 04/2006  . Obesity Sister   . Fibromyalgia Sister   . Stroke Son     Vertebral artery dissection, January 2009    History  Substance Use Topics  . Smoking status: Never Smoker   . Smokeless tobacco: Not on file  . Alcohol Use: No    OB History    Grav Para Term Preterm Abortions TAB SAB Ect Mult Living                  Review of Systems  HENT: Negative for neck stiffness.   Gastrointestinal: Positive for nausea.  Neurological: Positive for weakness.    All other systems reviewed and are negative.    Allergies  Adhesive; Penicillins; and Sulfonamide derivatives  Home Medications   Current Outpatient Rx  Name Route Sig Dispense Refill  . CALTRATE 600 PLUS-VIT D PO Oral Take 2 tablets by mouth daily.      Marland Kitchen FLUTICASONE PROPIONATE 50 MCG/ACT NA SUSP Nasal Place 1 spray into the nose daily. 16 g 5  . KRILL OIL 1000 MG PO CAPS Oral Take 1 capsule by mouth 2 (two) times daily.      . MULTIVITAMINS PO TABS Oral Take 1 tablet by mouth daily.      . TOPIRAMATE 25 MG PO TABS Oral Take 1 tablet (25 mg total) by mouth 2 (two) times daily. 60 tablet 4    Dose increase effective 11/25/2011    BP 150/78  Pulse 83  Temp 97.6 F (36.4 C) (Oral)  Resp 16  Ht 5\' 5"  (1.651 m)  Wt 158 lb (71.668 kg)  BMI 26.29 kg/m2  SpO2 99%  Physical Exam  Nursing note and vitals reviewed. Constitutional: She is oriented to person, place, and time. She appears well-developed and well-nourished. No distress.  HENT:  Head: Normocephalic and atraumatic.  Eyes: EOM are normal. Pupils are equal, round, and reactive to light.  Neck: Normal range  of motion. Neck supple.  Cardiovascular: Normal rate and regular rhythm.  Exam reveals no gallop and no friction rub.   No murmur heard. Pulmonary/Chest: Effort normal and breath sounds normal. No respiratory distress. She has no wheezes.  Abdominal: Soft. Bowel sounds are normal. She exhibits no distension. There is no tenderness.  Musculoskeletal: Normal range of motion.  Neurological: She is alert and oriented to person, place, and time. No cranial nerve deficit. She exhibits normal muscle tone. Coordination normal.       The symptoms are aggravated by movement, sitting up to listen to lungs.    Skin: Skin is warm and dry. She is not diaphoretic.    ED Course  Procedures (including critical care time)   Labs Reviewed  CBC WITH DIFFERENTIAL  COMPREHENSIVE METABOLIC PANEL  TROPONIN I   No results  found.   No diagnosis found.    MDM  The patient presents here with symptoms, physical findings, and workup that are consistent with a peripheral vertigo.  She feels better with meclizine and fluids and would like to go home.  She will be discharged with meclizine, to return prn if she worsens.        Geoffery Lyons, MD 02/29/12 [phone]

## 2012-02-29 NOTE — ED Notes (Signed)
Reports dizziness and numbness to right side of face upon waking at 0600 this morning.  Reports went to sleep last around 1030 last night normal.  CBG en route 104.

## 2012-02-29 NOTE — ED Notes (Signed)
States that she is feeling well now and her headache, dizziness and numbness are going away.

## 2012-03-07 ENCOUNTER — Ambulatory Visit (INDEPENDENT_AMBULATORY_CARE_PROVIDER_SITE_OTHER): Payer: Medicare Other | Admitting: Family Medicine

## 2012-03-07 ENCOUNTER — Encounter: Payer: Self-pay | Admitting: Family Medicine

## 2012-03-07 VITALS — BP 122/70 | HR 85 | Resp 18 | Ht 65.5 in | Wt 160.0 lb

## 2012-03-07 DIAGNOSIS — R42 Dizziness and giddiness: Secondary | ICD-10-CM | POA: Diagnosis not present

## 2012-03-07 DIAGNOSIS — R51 Headache: Secondary | ICD-10-CM | POA: Diagnosis not present

## 2012-03-07 DIAGNOSIS — I10 Essential (primary) hypertension: Secondary | ICD-10-CM

## 2012-03-07 DIAGNOSIS — E785 Hyperlipidemia, unspecified: Secondary | ICD-10-CM

## 2012-03-07 DIAGNOSIS — F329 Major depressive disorder, single episode, unspecified: Secondary | ICD-10-CM

## 2012-03-07 DIAGNOSIS — Z823 Family history of stroke: Secondary | ICD-10-CM

## 2012-03-07 DIAGNOSIS — R0989 Other specified symptoms and signs involving the circulatory and respiratory systems: Secondary | ICD-10-CM | POA: Diagnosis not present

## 2012-03-07 DIAGNOSIS — F411 Generalized anxiety disorder: Secondary | ICD-10-CM

## 2012-03-07 DIAGNOSIS — Z8249 Family history of ischemic heart disease and other diseases of the circulatory system: Secondary | ICD-10-CM

## 2012-03-07 NOTE — Progress Notes (Signed)
  Subjective:    Patient ID: Sara Woods, female    DOB: 05/11/1946, 66 y.o.   MRN: [id number]  HPI Pt in for ED follow up from last Monday, she had severe onset of vertigo, never as bad as this before, 911 was called to take her to the hospital, she has also had nausea with the vertigo and photophobia still persists, states unable to lie flat , has to sit up to sleep due to vertigo, and any small change in position triggers severe symptoms still with abnormal sensation in the eyes. No preceding viral illness noted. Also c/o increased frequency and severity of disabling headaches in the past 3 month, there is a family h/o brain aneurysm, also she has a h/o skin cancer and is worried that she may have significant brain pathology, has been managed for years for migraines, however feels there is a change in headache character. Denies any new neurologic symptoms except  For recent vertigo. She does state that earlier this year, around January she had a syncopal episode but never reported it before this. Currently very concerned about the possibility of brain aneurysm and /or brain cancer.    Review of Systems See HPI Denies recent fever or chills. Denies sinus pressure, nasal congestion, ear pain or sore throat. Denies chest congestion, productive cough or wheezing. Denies chest pains, palpitations and leg swelling Denies abdominal pain, nausea, vomiting,diarrhea or constipation.   Denies dysuria, frequency, hesitancy or incontinence. Denies joint pain, swelling and limitation in mobility. Denies  seizures, numbness, or tingling. Denies uncontrolled  Depression,has increased  anxiety with new symptoms Denies skin break down or rash.        Objective:   Physical Exam Patient alert and oriented and in no cardiopulmonary distress.Appears worried over health, understandably so, not as animated as she usually is  HEENT: No facial asymmetry, EOMI, no sinus tenderness,  oropharynx pink and  moist.  Neck decreased though adequate ROM no adenopathy.EOMI. Carotid Bruit  Chest: Clear to auscultation bilaterally.  CVS: S1, S2 no murmurs, no S3.  ABD: Soft non tender. Bowel sounds normal.  Ext: No edema  MS: Adequate ROM spine, shoulders, hips and knees.  Skin: Intact, no ulcerations or rash noted.  Psych: Good eye contact, flat  affect. Memory intact mildly  anxious and depressed appearing.  CNS: CN 2-12 intact, power, tone and sensation normal throughout.        Assessment & Plan:

## 2012-03-07 NOTE — Patient Instructions (Addendum)
Annual wellness in 3 month, please call if you need me before  You are referred to ENT re vertigo  You are referred for a brain scan as well as a test to evaluate blood flow to the brain since you report passing out earlier this year, also because your father had an aneurysm in his brain  I hope you feel better soon

## 2012-03-09 ENCOUNTER — Other Ambulatory Visit: Payer: Self-pay | Admitting: Family Medicine

## 2012-03-09 DIAGNOSIS — R51 Headache: Secondary | ICD-10-CM

## 2012-03-09 DIAGNOSIS — R42 Dizziness and giddiness: Secondary | ICD-10-CM | POA: Diagnosis not present

## 2012-03-09 DIAGNOSIS — H903 Sensorineural hearing loss, bilateral: Secondary | ICD-10-CM | POA: Diagnosis not present

## 2012-03-09 NOTE — Assessment & Plan Note (Signed)
Increased due to deterioration in health, no med change, advised pt that her symptoms will be thoroughly evaluated

## 2012-03-09 NOTE — Assessment & Plan Note (Signed)
Controlled, no change in medication DASH diet and commitment to daily physical activity for a minimum of 30 minutes discussed and encouraged, as a part of hypertension management. The importance of attaining a healthy weight is also discussed.  

## 2012-03-09 NOTE — Assessment & Plan Note (Signed)
3 month h/o worsening headaches as far as frequency and severity are concerned, not as res[ponsive to std medication pt has relied on, family h/o brain aneurysm. Refer for imaging studies

## 2012-03-09 NOTE — Assessment & Plan Note (Signed)
Controlled, no change in medication  

## 2012-03-09 NOTE — Assessment & Plan Note (Signed)
1 week h/o disabling veertigo, persistent , worse with positional change Refer to ENT  Asap, seems like BPV

## 2012-03-09 NOTE — Assessment & Plan Note (Signed)
Refer for carotid doppler 

## 2012-03-10 ENCOUNTER — Ambulatory Visit (HOSPITAL_COMMUNITY)
Admission: RE | Admit: 2012-03-10 | Discharge: 2012-03-10 | Disposition: A | Discharge status: Home / Self Care | Payer: Medicare Other | Source: Ambulatory Visit | Attending: Family Medicine | Admitting: Family Medicine

## 2012-03-10 DIAGNOSIS — R42 Dizziness and giddiness: Secondary | ICD-10-CM | POA: Insufficient documentation

## 2012-03-10 DIAGNOSIS — I6529 Occlusion and stenosis of unspecified carotid artery: Secondary | ICD-10-CM | POA: Diagnosis not present

## 2012-03-10 DIAGNOSIS — R0989 Other specified symptoms and signs involving the circulatory and respiratory systems: Secondary | ICD-10-CM | POA: Insufficient documentation

## 2012-03-10 DIAGNOSIS — I658 Occlusion and stenosis of other precerebral arteries: Secondary | ICD-10-CM | POA: Diagnosis not present

## 2012-03-11 ENCOUNTER — Other Ambulatory Visit: Payer: Self-pay | Admitting: Family Medicine

## 2012-03-11 ENCOUNTER — Ambulatory Visit (HOSPITAL_COMMUNITY)
Admission: RE | Admit: 2012-03-11 | Discharge: 2012-03-11 | Disposition: A | Discharge status: Home / Self Care | Payer: Medicare Other | Source: Ambulatory Visit | Attending: Family Medicine | Admitting: Family Medicine

## 2012-03-11 DIAGNOSIS — R51 Headache: Secondary | ICD-10-CM | POA: Diagnosis not present

## 2012-03-11 DIAGNOSIS — R42 Dizziness and giddiness: Secondary | ICD-10-CM | POA: Diagnosis not present

## 2012-03-11 DIAGNOSIS — R209 Unspecified disturbances of skin sensation: Secondary | ICD-10-CM | POA: Insufficient documentation

## 2012-03-11 DIAGNOSIS — I10 Essential (primary) hypertension: Secondary | ICD-10-CM | POA: Diagnosis not present

## 2012-03-11 MED ORDER — GADOBENATE DIMEGLUMINE 529 MG/ML IV SOLN
15.0000 mL | Freq: Once | INTRAVENOUS | Status: AC | PRN
  Administered 2012-03-11: 15 mL via INTRAVENOUS

## 2012-03-16 DIAGNOSIS — E785 Hyperlipidemia, unspecified: Secondary | ICD-10-CM | POA: Diagnosis not present

## 2012-03-16 DIAGNOSIS — I10 Essential (primary) hypertension: Secondary | ICD-10-CM | POA: Diagnosis not present

## 2012-03-16 LAB — LIPID PANEL
HDL: 55 mg/dL (ref >39)
LDL Cholesterol: 121 mg/dL — ABNORMAL HIGH (ref 0–99)
Total CHOL/HDL Ratio: 3.7 Ratio

## 2012-03-16 LAB — COMPREHENSIVE METABOLIC PANEL
ALT: 29 U/L (ref 0–35)
Alkaline Phosphatase: 73 U/L (ref 39–117)
Sodium: 141 mEq/L (ref 135–145)
Total Bilirubin: 0.4 mg/dL (ref 0.3–1.2)
Total Protein: 7.1 g/dL (ref 6.0–8.3)

## 2012-03-21 NOTE — Assessment & Plan Note (Signed)
Uncontrolled.No med change, closer attention to low fat diet at this time only

## 2012-03-23 ENCOUNTER — Encounter: Payer: Self-pay | Admitting: Family Medicine

## 2012-03-23 ENCOUNTER — Ambulatory Visit (INDEPENDENT_AMBULATORY_CARE_PROVIDER_SITE_OTHER): Payer: Medicare Other | Admitting: Family Medicine

## 2012-03-23 VITALS — BP 128/80 | HR 72 | Resp 18 | Ht 65.5 in | Wt 163.1 lb

## 2012-03-23 DIAGNOSIS — R51 Headache: Secondary | ICD-10-CM | POA: Diagnosis not present

## 2012-03-23 DIAGNOSIS — Z0000 Encounter for general adult medical examination without abnormal findings: Secondary | ICD-10-CM

## 2012-03-23 NOTE — Progress Notes (Signed)
Subjective:    Patient ID: Sara Woods, female    DOB: 21-May-1945, 66 y.o.   MRN: [id number]  HPI Preventive Screening-Counseling & Management   Patient present here today for a Medicare annual wellness visit.   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  No alcohol and nicotine   Risk Factors  Current exercise habits:  Daily physical acivity 20 to 30 minutes  Dietary issues discussed:Impt of diet rich in veg and fruit, and low in carbs and sweets    Cardiac risk factors: positive family h/o CAD Depression Screen  (Note: if answer to either of the following is "Yes", a more complete depression screening is indicated)   Over the past two weeks, have you felt down, depressed or hopeless? No  Over the past two weeks, have you felt little interest or pleasure in doing things? No  Have you lost interest or pleasure in daily life? No  Do you often feel hopeless? No  Do you cry easily over simple problems? No   Activities of Daily Living  In your present state of health, do you have any difficulty performing the following activities?  Driving?: No Managing money?: No Feeding yourself?:No Getting from bed to chair?:No Climbing a flight of stairs?:No Preparing food and eating?:No Bathing or showering?:No Getting dressed?:No Getting to the toilet?:No Using the toilet?:No Moving around from place to place?: No  Fall Risk Assessment In the past year have you fallen or had a near fall?:yes had severe episode of vertigo in October Are you currently taking any medications that make you dizziness?:No   Hearing Difficulties: No Do you often ask people to speak up or repeat themselves?:yes Do you experience  ringing or noises in your ears?:sometimes Do you have difficulty understanding soft or whispered voices?:yes  Cognitive Testing  Alert? Yes Normal Appearance?Yes  Oriented to person? Yes Place? Yes  Time? Yes  Displays appropriate judgment?Yes  Can read the correct time from a watch face? yes Are you having problems remembering things?No  Advanced Directives have been discussed with the patient?Yes , pt states she is a DNR, states her spouse nable to "stand" any ressucitiaition. States this is already documented legally and her only child, a son , is aware   List the Names of Other Physician/Practitioners you currently use: Documented   I).   Assessment:    Annual Wellness Exam   Plan:    During the course of the visit the patient was educated and counseled about appropriate screening and preventive services including:  A healthy diet is rich in fruit, vegetables and whole grains. Poultry fish, nuts and beans are a healthy choice for protein rather then red meat. A low sodium diet and drinking 64 ounces of water daily is generally recommended. Oils and sweet should be limited. Carbohydrates especially for those who are diabetic or overweight, should be limited to 30-45 gram per meal. It is important to eat on a regular schedule, at least 3 times daily. Snacks should be primarily fruits, vegetables or nuts. It is important that you exercise regularly at least 30 minutes 5 times a week. If you develop chest pain, have severe difficulty breathing, or feel very tired, stop exercising immediately and  seek medical attention  Immunization reviewed and updated. Cancer screening reviewed and updated    Patient Instructions (the written plan) was given to the patient.  Medicare Attestation  I have personally reviewed:  The patient's medical and social history  Their use of alcohol, tobacco or illicit drugs  Their current medications and supplements  The patient's functional ability  including ADLs,fall risks, home safety risks, cognitive, and hearing and visual impairment  Diet and physical activities  Evidence for depression or mood disorders  The patient's weight, height, BMI, and visual acuity have been recorded in the chart. I have made referrals, counseling, and provided education to the patient based on review of the above and I have provided the patient with a written personalized care plan for preventive services.      Review of Systems     Objective:   Physical Exam        Assessment & Plan:

## 2012-03-23 NOTE — Patient Instructions (Addendum)
F/u in 4.5 months, please call if you need me before  You will be referred to the headache clinic for March 2014 per your request.  i am thankful that you feel better  No med changes at this time

## 2012-03-27 DIAGNOSIS — Z0000 Encounter for general adult medical examination without abnormal findings: Secondary | ICD-10-CM | POA: Insufficient documentation

## 2012-03-27 NOTE — Assessment & Plan Note (Signed)
Annual wellness exam documented in body of visit. She is fully functional with no limitatios in he ability to function independently. Vision and hearing are not limiting

## 2012-03-27 NOTE — Assessment & Plan Note (Signed)
Uncontrolled, and increased in severity and frequency, recently had severe vertigo associated with headache, and reports near syncope earlier this year.Referral to clinic in the Spring per pt request. Recent brain scans are neg for aneurysm or tumor which is very reassuring since both are in her family

## 2012-04-08 ENCOUNTER — Other Ambulatory Visit: Payer: Self-pay | Admitting: Family Medicine

## 2012-04-11 ENCOUNTER — Other Ambulatory Visit: Payer: Self-pay | Admitting: Family Medicine

## 2012-05-18 ENCOUNTER — Other Ambulatory Visit: Payer: Self-pay | Admitting: Family Medicine

## 2012-06-27 ENCOUNTER — Telehealth: Payer: Self-pay | Admitting: Family Medicine

## 2012-06-27 NOTE — Telephone Encounter (Signed)
Noted  

## 2012-06-29 ENCOUNTER — Other Ambulatory Visit: Payer: Self-pay | Admitting: Family Medicine

## 2012-07-12 ENCOUNTER — Other Ambulatory Visit: Payer: Self-pay | Admitting: Family Medicine

## 2012-08-03 ENCOUNTER — Ambulatory Visit (INDEPENDENT_AMBULATORY_CARE_PROVIDER_SITE_OTHER): Payer: Medicare Other | Admitting: Family Medicine

## 2012-08-03 ENCOUNTER — Encounter: Payer: Self-pay | Admitting: Family Medicine

## 2012-08-03 VITALS — BP 118/72 | HR 77 | Temp 98.6°F | Resp 18 | Ht 65.5 in | Wt 166.1 lb

## 2012-08-03 DIAGNOSIS — E785 Hyperlipidemia, unspecified: Secondary | ICD-10-CM

## 2012-08-03 DIAGNOSIS — J209 Acute bronchitis, unspecified: Secondary | ICD-10-CM | POA: Diagnosis not present

## 2012-08-03 DIAGNOSIS — F411 Generalized anxiety disorder: Secondary | ICD-10-CM

## 2012-08-03 DIAGNOSIS — I10 Essential (primary) hypertension: Secondary | ICD-10-CM | POA: Diagnosis not present

## 2012-08-03 DIAGNOSIS — R51 Headache: Secondary | ICD-10-CM

## 2012-08-03 MED ORDER — METHYLPREDNISOLONE ACETATE 80 MG/ML IJ SUSP
80.0000 mg | Freq: Once | INTRAMUSCULAR | Status: AC
  Administered 2012-08-03: 80 mg via INTRAMUSCULAR

## 2012-08-03 MED ORDER — ALBUTEROL SULFATE (2.5 MG/3ML) 0.083% IN NEBU
2.5000 mg | INHALATION_SOLUTION | Freq: Once | RESPIRATORY_TRACT | Status: AC
  Administered 2012-08-03: 2.5 mg via RESPIRATORY_TRACT

## 2012-08-03 MED ORDER — PREDNISONE (PAK) 5 MG PO TABS: 5.0000 mg | ORAL_TABLET | ORAL | Status: DC

## 2012-08-03 MED ORDER — KETOROLAC TROMETHAMINE 60 MG/2ML IM SOLN
60.0000 mg | Freq: Once | INTRAMUSCULAR | Status: AC
  Administered 2012-08-03: 60 mg via INTRAMUSCULAR

## 2012-08-03 MED ORDER — BENZONATATE 100 MG PO CAPS: 100.0000 mg | ORAL_CAPSULE | Freq: Four times a day (QID) | ORAL | Status: DC | PRN

## 2012-08-03 MED ORDER — DOXYCYCLINE HYCLATE 100 MG PO CAPS: 100.0000 mg | ORAL_CAPSULE | Freq: Two times a day (BID) | ORAL | Status: DC

## 2012-08-03 MED ORDER — IPRATROPIUM BROMIDE 0.02 % IN SOLN
0.5000 mg | Freq: Once | RESPIRATORY_TRACT | Status: AC
  Administered 2012-08-03: 0.5 mg via RESPIRATORY_TRACT

## 2012-08-03 MED ORDER — PROMETHAZINE-DM 6.25-15 MG/5ML PO SYRP: ORAL_SOLUTION | ORAL | Status: AC

## 2012-08-03 NOTE — Progress Notes (Signed)
  Subjective:    Patient ID: Sara Woods, female    DOB: 11/19/1945, 67 y.o.   MRN: [id number]  HPI  10 day h/o head and chest congestion, with cough productive of green sputum, chills, no documented fevr. Denies nasal drainage , but does have head pain/pressure, rated at a 7 .  Increased anxiety, as will soon be responsible for care of her Mom in law who recently had hip replacement   Review of Systems See HPI Denies sinus pressure,increased  nasal congestion,denies  ear pain or sore throat. . Denies chest pains, palpitations and leg swelling Denies abdominal pain, nausea, vomiting,diarrhea or constipation.   Denies dysuria, frequency, hesitancy or incontinence. Denies joint pain, swelling and limitation in mobility. Denies seizures, numbness, or tingling. Denies uncontrolled depression,has increased  anxiety and mild  Insomnia due to excessive cough Denies skin break down or rash.        Objective:   Physical Exam  Patient alert and oriented and in no cardiopulmonary distress.Ill appearing  HEENT: No facial asymmetry, EOMI, no sinus tenderness,  oropharynx pink and moist.  Neck supple no adenopathy.TM clear bilaterally. Nasal mucosa erythematous and edematous  Chest: decreased though adequate air entry, scattered whhezes, few crackles in right base  CVS: S1, S2 no murmurs, no S3.  ABD: Soft non tender. Bowel sounds normal.  Ext: No edema  MS: Adequate ROM spine, shoulders, hips and knees.  Skin: Intact, no ulcerations or rash noted.  Psych: Good eye contact, normal affect. Memory intact not anxious or depressed appearing.  CNS: CN 2-12 intact, power, tone and sensation normal throughout.       Assessment & Plan:

## 2012-08-03 NOTE — Patient Instructions (Addendum)
F/u in 4 month  Toradol 60mg  IM, Depo medrol 80mg  iM in office for headche  Neb treatment in office today for  Bronchitis  Antibiotic, decongestant and prednisone dose pack sent to pharmacy  Cough suppressant also for night use

## 2012-08-07 NOTE — Assessment & Plan Note (Signed)
Controlled, no change in medication DASH diet and commitment to daily physical activity for a minimum of 30 minutes discussed and encouraged, as a part of hypertension management. The importance of attaining a healthy weight is also discussed.  

## 2012-08-07 NOTE — Assessment & Plan Note (Signed)
Neb treatment in office, antibiotic, decongestant nd cough suppressant and prednisone prescribed Pt to call if no better in 5 days CX will be ordered at that time

## 2012-08-07 NOTE — Assessment & Plan Note (Signed)
Not at goal when last checked. Hyperlipidemia:Low fat diet discussed and encouraged.  updated lab for next visit

## 2012-08-07 NOTE — Assessment & Plan Note (Signed)
Slightly increased, will soon have to care for ill Mom in law

## 2012-08-07 NOTE — Assessment & Plan Note (Signed)
Uncontrolled, rated at a 7, toradol and depo medrol in office

## 2012-08-17 ENCOUNTER — Ambulatory Visit: Payer: Medicare Other | Admitting: Family Medicine

## 2012-09-26 ENCOUNTER — Other Ambulatory Visit: Payer: Self-pay | Admitting: Family Medicine

## 2012-10-20 ENCOUNTER — Other Ambulatory Visit: Payer: Self-pay | Admitting: Family Medicine

## 2012-11-03 ENCOUNTER — Other Ambulatory Visit: Payer: Self-pay | Admitting: Family Medicine

## 2012-11-14 ENCOUNTER — Other Ambulatory Visit: Payer: Self-pay | Admitting: Family Medicine

## 2012-11-29 ENCOUNTER — Encounter: Payer: Self-pay | Admitting: Family Medicine

## 2012-11-29 ENCOUNTER — Ambulatory Visit (INDEPENDENT_AMBULATORY_CARE_PROVIDER_SITE_OTHER): Payer: Medicare Other | Admitting: Family Medicine

## 2012-11-29 VITALS — BP 150/84 | HR 81 | Resp 16 | Ht 65.5 in | Wt 173.4 lb

## 2012-11-29 DIAGNOSIS — Z1211 Encounter for screening for malignant neoplasm of colon: Secondary | ICD-10-CM | POA: Diagnosis not present

## 2012-11-29 DIAGNOSIS — R42 Dizziness and giddiness: Secondary | ICD-10-CM

## 2012-11-29 DIAGNOSIS — R5381 Other malaise: Secondary | ICD-10-CM | POA: Diagnosis not present

## 2012-11-29 DIAGNOSIS — E785 Hyperlipidemia, unspecified: Secondary | ICD-10-CM | POA: Diagnosis not present

## 2012-11-29 DIAGNOSIS — J019 Acute sinusitis, unspecified: Secondary | ICD-10-CM | POA: Diagnosis not present

## 2012-11-29 DIAGNOSIS — F3289 Other specified depressive episodes: Secondary | ICD-10-CM

## 2012-11-29 DIAGNOSIS — Z1239 Encounter for other screening for malignant neoplasm of breast: Secondary | ICD-10-CM | POA: Diagnosis not present

## 2012-11-29 DIAGNOSIS — R51 Headache: Secondary | ICD-10-CM

## 2012-11-29 DIAGNOSIS — R5383 Other fatigue: Secondary | ICD-10-CM | POA: Diagnosis not present

## 2012-11-29 DIAGNOSIS — I10 Essential (primary) hypertension: Secondary | ICD-10-CM

## 2012-11-29 DIAGNOSIS — F329 Major depressive disorder, single episode, unspecified: Secondary | ICD-10-CM

## 2012-11-29 LAB — HEMOCCULT GUIAC POC 1CARD (OFFICE)

## 2012-11-29 MED ORDER — ASPIRIN EC 81 MG PO TBEC: 81.0000 mg | DELAYED_RELEASE_TABLET | Freq: Every day | ORAL | Status: AC

## 2012-11-29 MED ORDER — TOPIRAMATE 25 MG PO TABS: ORAL_TABLET | ORAL | Status: DC

## 2012-11-29 MED ORDER — DOXYCYCLINE HYCLATE 100 MG PO TBEC: 100.0000 mg | DELAYED_RELEASE_TABLET | Freq: Two times a day (BID) | ORAL | Status: AC

## 2012-11-29 MED ORDER — LISINOPRIL 20 MG PO TABS: ORAL_TABLET | ORAL | Status: DC

## 2012-11-29 MED ORDER — HYDROCHLOROTHIAZIDE 25 MG PO TABS: ORAL_TABLET | ORAL | Status: DC

## 2012-11-29 MED ORDER — HYDROCODONE-ACETAMINOPHEN 5-325 MG PO TABS: 1.0000 | ORAL_TABLET | Freq: Every day | ORAL | Status: DC | PRN

## 2012-11-29 NOTE — Patient Instructions (Addendum)
F/u early December, call in October for flu shot, and call if you need me before  Fasting lipid, chem 7, TSH within the next 1 week please.  Rectal exam today is normal (no hidden blood, no mass)  Doxycycline prescribed for right maxillary sinus infection  You need to take aspirin 81mg  every day to reduce your risk of stroke  Pls check at front desk for mammogram appointment, on your way out  It is important that you exercise regularly at least 30 minutes 5 times a week. If you develop chest pain, have severe difficulty breathing, or feel very tired, stop exercising immediately and seek medical attention   Weight loss goal of 6 to 8 pounds

## 2012-12-04 NOTE — Assessment & Plan Note (Signed)
Acute infection antibiotic course prescribed 

## 2012-12-04 NOTE — Progress Notes (Signed)
  Subjective:    Patient ID: Sara Woods, female    DOB: March 18, 1946, 67 y.o.   MRN: [id number]  HPI The PT is here for follow up and re-evaluation of chronic medical conditions, medication management and review of any available recent lab and radiology data.  Preventive health is updated, specifically  Cancer screening and Immunization.   . The PT denies any adverse reactions to current medications since the last visit.  1 week h/o right facial pressure over cheek with yellow post nasal drainage no fever intermittent chills. No cough, scratchy throat at times, no ear pain.  Recently lost her 34 y/o dog to cancer which is a cause of increased stress at home, her husband is taking it hard, and she is very protective of him Concerned about weight gain and intends to reverse this      Review of Systems See HPI Denies recent fever or chills. Denies sinus pressure, nasal congestion, ear pain or sore throat. Denies chest congestion, productive cough or wheezing. Denies chest pains, palpitations and leg swelling Denies abdominal pain, nausea, vomiting,diarrhea or constipation.   Denies dysuria, frequency, hesitancy or incontinence. Denies uncontrolled  joint pain, swelling and limitation in mobility. Denies headaches, seizures, numbness, or tingling. Denies depression, has increased anxiety  With death of dog, denies  insomnia. Denies skin break down or rash.        Objective:   Physical Exam Patient alert and oriented and in no cardiopulmonary distress.  HEENT: No facial asymmetry, EOMI, right maxillary  sinus tenderness,  oropharynx pink and moist.  Neck supple no adenopathy.  Chest: Clear to auscultation bilaterally.  CVS: S1, S2 no murmurs, no S3.  ABD: Soft non tender. Bowel sounds normal. Rectal:no mass, heme negative stool Ext: No edema  MS: Adequate ROM spine, shoulders, hips and knees.  Skin: Intact, no ulcerations or rash noted.  Psych: Good eye contact, normal  affect. Memory intact not anxious or depressed appearing.Somewhat sad due to recent loss of her 36 yo dog  CNS: CN 2-12 intact, power, tone and sensation normal throughout.        Assessment & Plan:

## 2012-12-04 NOTE — Assessment & Plan Note (Signed)
Controlled well on prophylactic topamax

## 2012-12-04 NOTE — Assessment & Plan Note (Signed)
Controlled, no change in medication DASH diet and commitment to daily physical activity for a minimum of 30 minutes discussed and encouraged, as a part of hypertension management. The importance of attaining a healthy weight is also discussed.  

## 2012-12-04 NOTE — Assessment & Plan Note (Signed)
Intermittent , no recent occurrence in past month, responds to antivert

## 2012-12-04 NOTE — Assessment & Plan Note (Signed)
Updated lab needed Hyperlipidemia:Low fat diet discussed and encouraged.   

## 2012-12-04 NOTE — Assessment & Plan Note (Signed)
Heme negative stool in 11/2012

## 2012-12-04 NOTE — Assessment & Plan Note (Signed)
Controlled, no change in medication  

## 2012-12-05 ENCOUNTER — Ambulatory Visit (HOSPITAL_COMMUNITY)
Admission: RE | Admit: 2012-12-05 | Discharge: 2012-12-05 | Disposition: A | Discharge status: Home / Self Care | Payer: Medicare Other | Source: Ambulatory Visit | Attending: Family Medicine | Admitting: Family Medicine

## 2012-12-05 DIAGNOSIS — Z1239 Encounter for other screening for malignant neoplasm of breast: Secondary | ICD-10-CM

## 2012-12-05 DIAGNOSIS — Z1231 Encounter for screening mammogram for malignant neoplasm of breast: Secondary | ICD-10-CM | POA: Insufficient documentation

## 2012-12-13 ENCOUNTER — Other Ambulatory Visit: Payer: Self-pay | Admitting: Family Medicine

## 2012-12-13 DIAGNOSIS — R5381 Other malaise: Secondary | ICD-10-CM | POA: Diagnosis not present

## 2012-12-13 DIAGNOSIS — E785 Hyperlipidemia, unspecified: Secondary | ICD-10-CM | POA: Diagnosis not present

## 2012-12-14 ENCOUNTER — Other Ambulatory Visit: Payer: Self-pay | Admitting: Family Medicine

## 2012-12-14 LAB — HEPATIC FUNCTION PANEL
AST: 19 U/L (ref 0–37)
Albumin: 4 g/dL (ref 3.5–5.2)
Alkaline Phosphatase: 62 U/L (ref 39–117)
Bilirubin, Direct: 0.1 mg/dL (ref 0.0–0.3)
Total Bilirubin: 0.5 mg/dL (ref 0.3–1.2)

## 2012-12-14 LAB — BASIC METABOLIC PANEL
BUN: 23 mg/dL (ref 6–23)
CO2: 27 mEq/L (ref 19–32)
Chloride: 105 mEq/L (ref 96–112)
Glucose, Bld: 80 mg/dL (ref 70–99)
Potassium: 4 mEq/L (ref 3.5–5.3)

## 2012-12-14 LAB — LIPID PANEL
Cholesterol: 221 mg/dL — ABNORMAL HIGH (ref 0–200)
Triglycerides: 153 mg/dL — ABNORMAL HIGH (ref <150)

## 2012-12-14 MED ORDER — PRAVASTATIN SODIUM 80 MG PO TABS: 80.0000 mg | ORAL_TABLET | Freq: Every evening | ORAL | Status: DC

## 2012-12-14 NOTE — Addendum Note (Signed)
Addended by: Abner Greenspan on: 12/14/2012 01:30 PM   Modules accepted: Orders

## 2012-12-21 ENCOUNTER — Other Ambulatory Visit: Payer: Self-pay | Admitting: Family Medicine

## 2013-01-06 ENCOUNTER — Other Ambulatory Visit: Payer: Self-pay | Admitting: Family Medicine

## 2013-01-30 ENCOUNTER — Other Ambulatory Visit: Payer: Self-pay | Admitting: Family Medicine

## 2013-02-09 ENCOUNTER — Other Ambulatory Visit: Payer: Self-pay | Admitting: Family Medicine

## 2013-02-20 ENCOUNTER — Other Ambulatory Visit: Payer: Self-pay | Admitting: Family Medicine

## 2013-02-22 DIAGNOSIS — L821 Other seborrheic keratosis: Secondary | ICD-10-CM | POA: Diagnosis not present

## 2013-02-22 DIAGNOSIS — D1800 Hemangioma unspecified site: Secondary | ICD-10-CM | POA: Diagnosis not present

## 2013-02-22 DIAGNOSIS — L570 Actinic keratosis: Secondary | ICD-10-CM | POA: Diagnosis not present

## 2013-02-22 DIAGNOSIS — D485 Neoplasm of uncertain behavior of skin: Secondary | ICD-10-CM | POA: Diagnosis not present

## 2013-02-27 ENCOUNTER — Ambulatory Visit (INDEPENDENT_AMBULATORY_CARE_PROVIDER_SITE_OTHER): Payer: Medicare Other

## 2013-02-27 DIAGNOSIS — Z23 Encounter for immunization: Secondary | ICD-10-CM | POA: Diagnosis not present

## 2013-03-16 IMAGING — CT CT HEAD W/O CM
1 series · 16 of 30 positions shown, 20 images · non-contrast
Comparison: None.

CLINICAL DATA: Limp.  Unable to get out of bed.  Dizziness.
Weakness.  History of skin cancer.  History of left breast cancer.
History hypertension and migraines.

CT HEAD WITHOUT CONTRAST
TECHNIQUE: Contiguous axial images were obtained from the base of
the skull through the vertex without contrast.

[Series 2: headseq 4.8 h37s · axial · 0.45mm/px · z∈[+131,+262]mm · 16 of 30 slices shown, 20 images]
[im 2/30  brain]
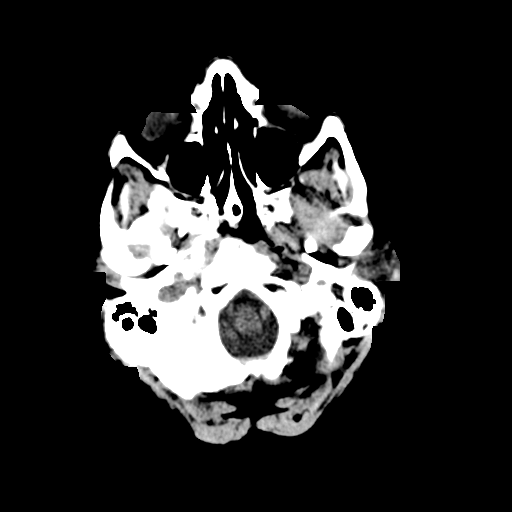
[im 2/30  bone]
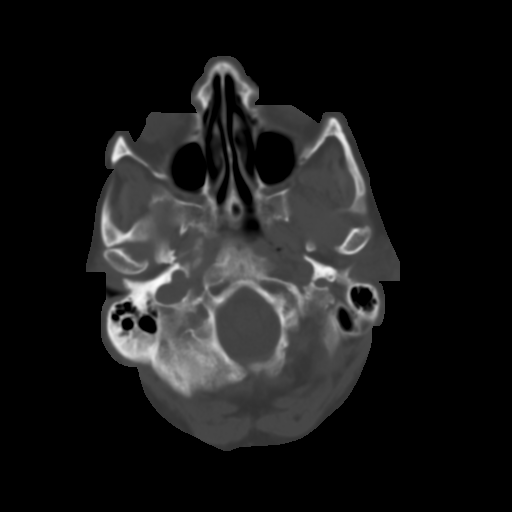
[im 4/30  brain]
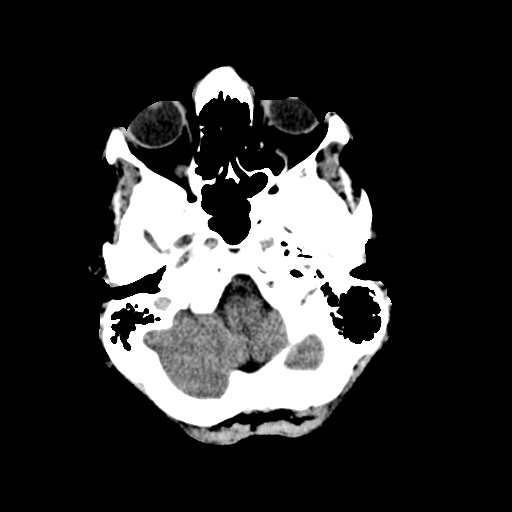
[im 6/30  brain]
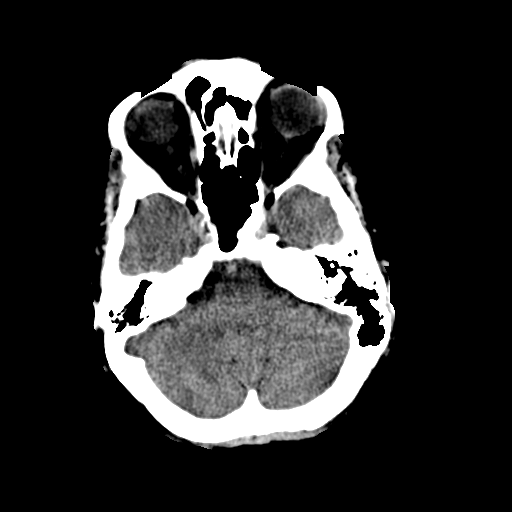
[im 8/30  brain]
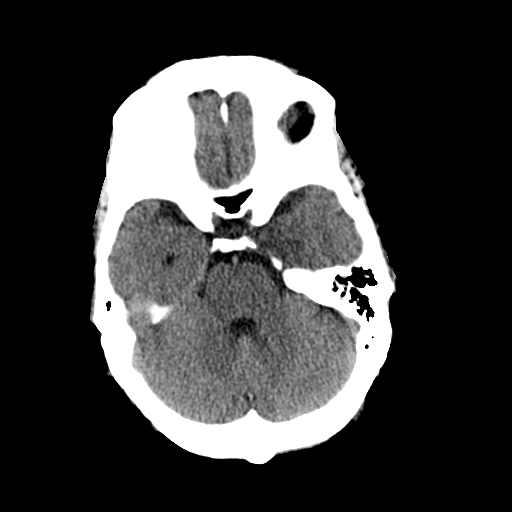
[im 9/30  brain]
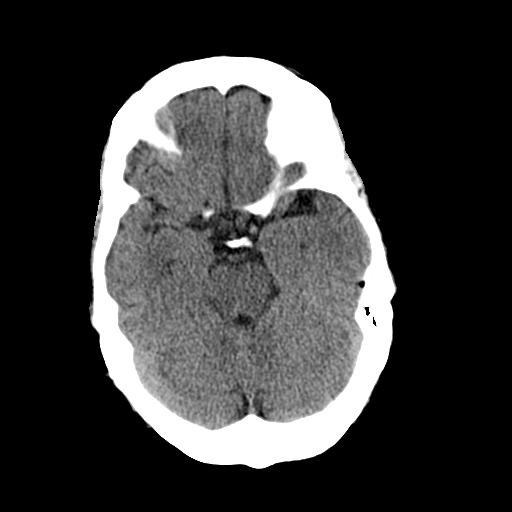
[im 9/30  bone]
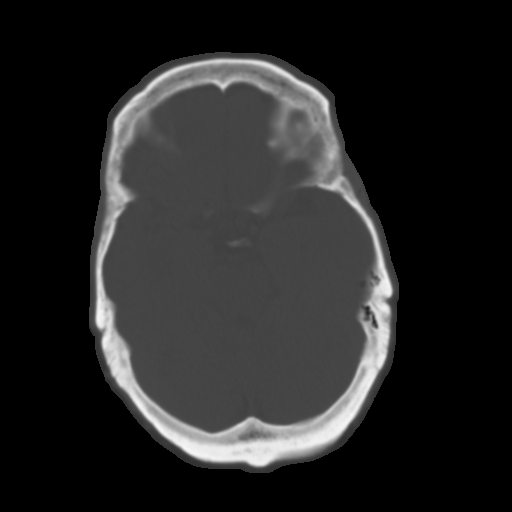
[im 11/30  brain]
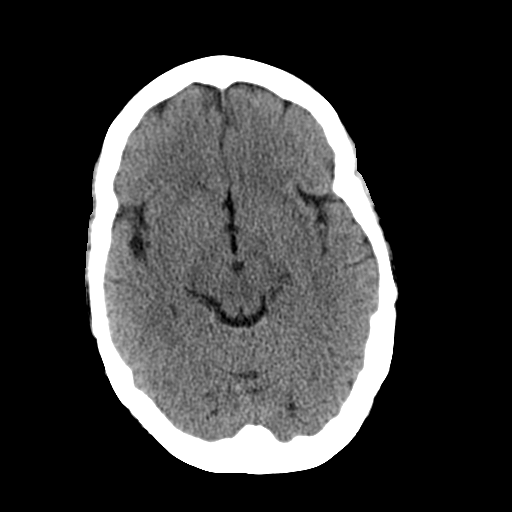
[im 13/30  brain]
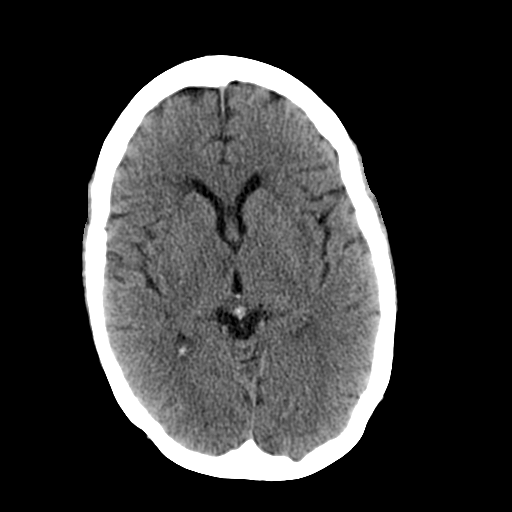
[im 15/30  brain]
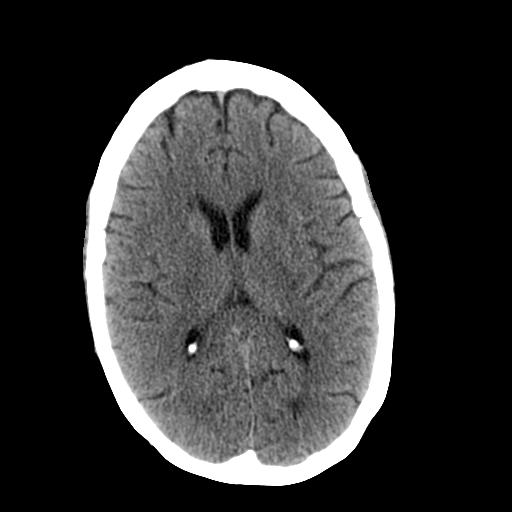
[im 16/30  brain]
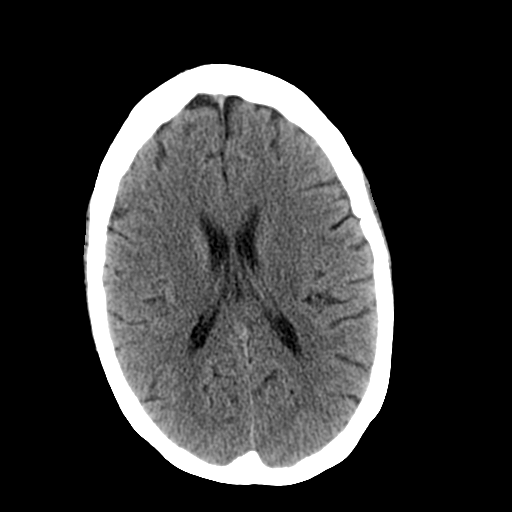
[im 16/30  bone]
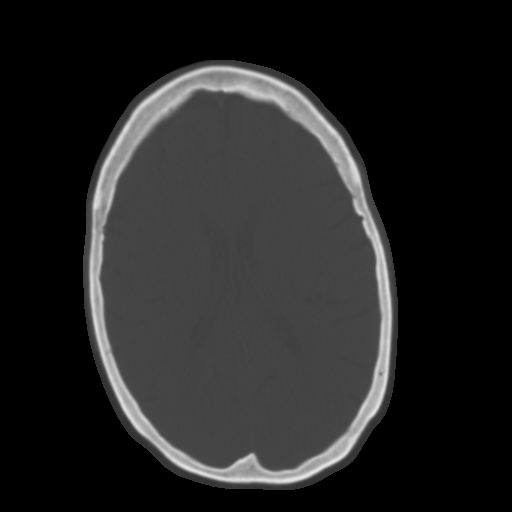
[im 18/30  brain]
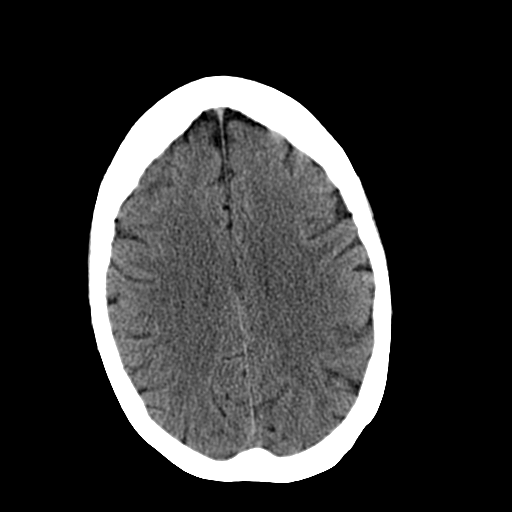
[im 20/30  brain]
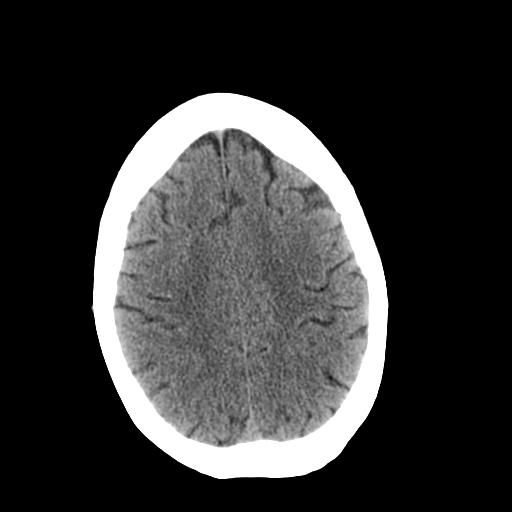
[im 22/30  brain]
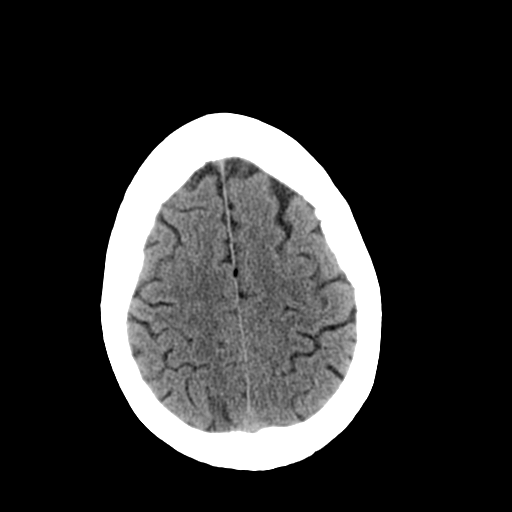
[im 23/30  brain]
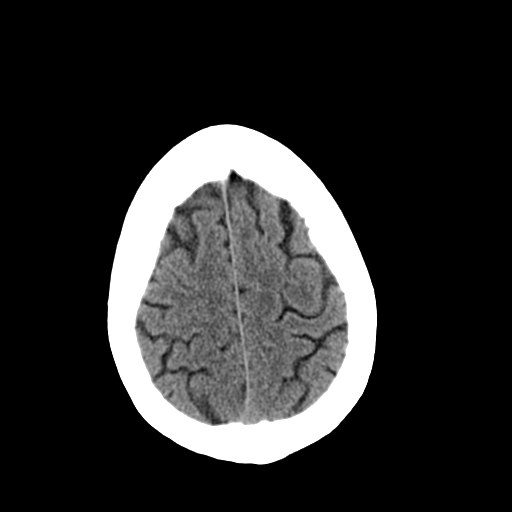
[im 23/30  bone]
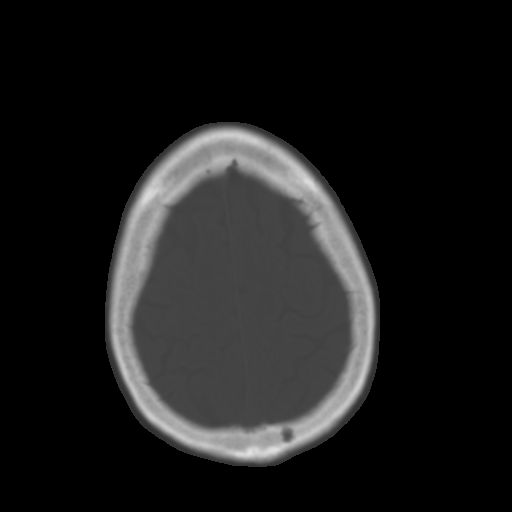
[im 25/30  brain]
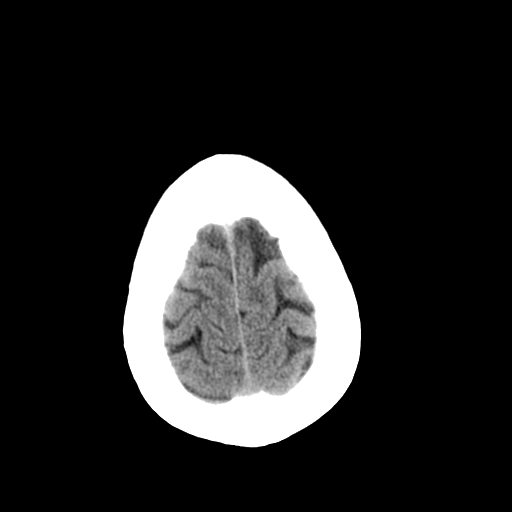
[im 27/30  brain]
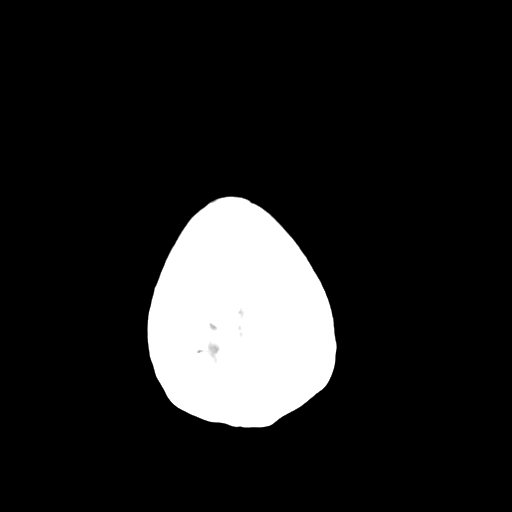
[im 29/30  brain]
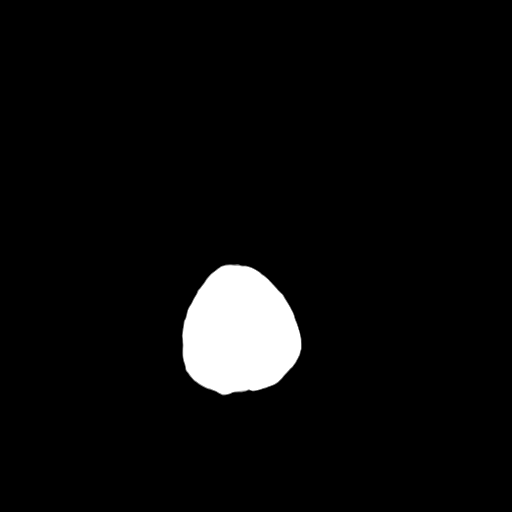

[16 of 30 positions shown; findings below may reference images not displayed]

FINDINGS: There is no intra or extra-axial fluid collection or mass
lesion.  The basilar cisterns and ventricles have a normal
appearance.  There is no CT evidence for acute infarction or
hemorrhage.

Bone windows show no acute finding.  Visualized paranasal sinuses
and mastoid air cells are well-aerated.
IMPRESSION: Negative exam.

## 2013-03-20 ENCOUNTER — Other Ambulatory Visit: Payer: Self-pay

## 2013-03-20 ENCOUNTER — Other Ambulatory Visit: Payer: Self-pay | Admitting: Family Medicine

## 2013-03-20 MED ORDER — HYDROCODONE-ACETAMINOPHEN 5-325 MG PO TABS: 1.0000 | ORAL_TABLET | Freq: Every day | ORAL | Status: DC | PRN

## 2013-03-20 MED ORDER — HYDROCODONE-ACETAMINOPHEN 5-325 MG PO TABS: ORAL_TABLET | ORAL | Status: DC

## 2013-04-03 ENCOUNTER — Other Ambulatory Visit: Payer: Self-pay | Admitting: Family Medicine

## 2013-04-03 DIAGNOSIS — R5381 Other malaise: Secondary | ICD-10-CM | POA: Diagnosis not present

## 2013-04-03 DIAGNOSIS — E785 Hyperlipidemia, unspecified: Secondary | ICD-10-CM | POA: Diagnosis not present

## 2013-04-03 LAB — BASIC METABOLIC PANEL
CO2: 27 mEq/L (ref 19–32)
Calcium: 9.9 mg/dL (ref 8.4–10.5)
Chloride: 105 mEq/L (ref 96–112)
Glucose, Bld: 101 mg/dL — ABNORMAL HIGH (ref 70–99)
Potassium: 4.1 mEq/L (ref 3.5–5.3)
Sodium: 140 mEq/L (ref 135–145)

## 2013-04-03 LAB — TSH: TSH: 1.085 u[IU]/mL (ref 0.350–4.500)

## 2013-04-03 LAB — LIPID PANEL
HDL: 54 mg/dL (ref >39)
Total CHOL/HDL Ratio: 3.4 Ratio
VLDL: 25 mg/dL (ref 0–40)

## 2013-04-10 ENCOUNTER — Ambulatory Visit (INDEPENDENT_AMBULATORY_CARE_PROVIDER_SITE_OTHER): Payer: Medicare Other | Admitting: Family Medicine

## 2013-04-10 ENCOUNTER — Encounter: Payer: Self-pay | Admitting: Family Medicine

## 2013-04-10 VITALS — BP 120/74 | HR 80 | Resp 18 | Ht 65.5 in | Wt 170.0 lb

## 2013-04-10 DIAGNOSIS — F329 Major depressive disorder, single episode, unspecified: Secondary | ICD-10-CM

## 2013-04-10 DIAGNOSIS — I10 Essential (primary) hypertension: Secondary | ICD-10-CM

## 2013-04-10 DIAGNOSIS — J320 Chronic maxillary sinusitis: Secondary | ICD-10-CM | POA: Diagnosis not present

## 2013-04-10 DIAGNOSIS — J42 Unspecified chronic bronchitis: Secondary | ICD-10-CM | POA: Diagnosis not present

## 2013-04-10 DIAGNOSIS — E785 Hyperlipidemia, unspecified: Secondary | ICD-10-CM | POA: Diagnosis not present

## 2013-04-10 DIAGNOSIS — M25559 Pain in unspecified hip: Secondary | ICD-10-CM

## 2013-04-10 DIAGNOSIS — F3289 Other specified depressive episodes: Secondary | ICD-10-CM

## 2013-04-10 DIAGNOSIS — M25551 Pain in right hip: Secondary | ICD-10-CM | POA: Insufficient documentation

## 2013-04-10 MED ORDER — PREDNISONE (PAK) 5 MG PO TABS: 5.0000 mg | ORAL_TABLET | ORAL | Status: DC

## 2013-04-10 MED ORDER — METHYLPREDNISOLONE ACETATE 80 MG/ML IJ SUSP
80.0000 mg | Freq: Once | INTRAMUSCULAR | Status: AC
  Administered 2013-04-10: 80 mg via INTRAMUSCULAR

## 2013-04-10 MED ORDER — NAPROXEN 500 MG PO TABS: 500.0000 mg | ORAL_TABLET | Freq: Two times a day (BID) | ORAL | Status: AC

## 2013-04-10 MED ORDER — BENZONATATE 100 MG PO CAPS: 100.0000 mg | ORAL_CAPSULE | Freq: Four times a day (QID) | ORAL | Status: DC | PRN

## 2013-04-10 MED ORDER — LEVOFLOXACIN 500 MG PO TABS: 500.0000 mg | ORAL_TABLET | Freq: Every day | ORAL | Status: AC

## 2013-04-10 MED ORDER — KETOROLAC TROMETHAMINE 60 MG/2ML IM SOLN
60.0000 mg | Freq: Once | INTRAMUSCULAR | Status: AC
  Administered 2013-04-10: 60 mg via INTRAMUSCULAR

## 2013-04-10 NOTE — Progress Notes (Signed)
   Subjective:    Patient ID: Sara Woods, female    DOB: [birth year], 67 y.o.   MRN: [id number]  HPI  2 month h/o right maxillary pressure, right ear pressure with reduced hearing, and bilateral sores in both nostrils. No fever, has had intermittent chills . Sputum is green and thick and nasal drainage is also green. C/o increased right hip pain, not aggravated by direct trauma, pain is localized, non radiating has had this in the past Denies uncontrolled depression or insomnia, recent stress due to illness in close friends and family, but coping well  Review of Systems See HPI  Denies chest pains, palpitations and leg swelling Denies abdominal pain, nausea, vomiting,diarrhea or constipation.   Denies dysuria, frequency, hesitancy or incontinence.  Denies headaches, seizures, numbness, or tingling. Denies uncontrolled  depression, anxiety or insomnia. Denies skin break down or rash.        Objective:   Physical Exam  Patient alert and oriented and in no cardiopulmonary distress.  HEENT: No facial asymmetry, EOMI, maxillary  sinus tenderness,  oropharynx pink and moist.  Neck supple no adenopathy.Nasal mucosa erythematous and edematous, no visible sores in asal passage  Chest: Adequate aire entry throughout , few crackles  Bilaterally, no whezes  CVS: S1, S2 no murmurs, no S3.  ABD: Soft non tender. Bowel sounds normal.  Ext: No edema  MS: Adequate though reduced  ROM spine, and  hips tender over right SI joint  Skin: Intact, no ulcerations or rash noted.  Psych: Good eye contact, normal affect. Memory intact not anxious or depressed appearing.  CNS: CN 2-12 intact, power, tone and sensation normal throughout.       Assessment & Plan:

## 2013-04-10 NOTE — Patient Instructions (Addendum)
F/u in 3 month, call if you need me before  You are being treated for right maxillary sinusitis with levaquin for 10 days,   You get a sputum container to take sputum to the lab on completion of your antibiotic, if you continue to have green sputum, it is VERy important that you do this.  You need a CXR due to chronic bronchitis, which is also being treated, the order is at the hospital please go no later than tomorrow. Decongestants are also prescribed. The antibiotic prescribed will take care of sinuses, chest congestion and sores in nostril  Please call back if you continue to have ear pain ansd sinus pressure in the next 2 weeks, I will have Dr Suszanne Conners re evaluate you  Toradol 60mg  and depo medrol 80mg  Im in  Office today for right hip pain and prednisone and naproxen sent to your pharmacy for 1 week  If pain persists, call for referral to orthopedics  When sick, please call in for a sooner appointment!

## 2013-04-11 ENCOUNTER — Ambulatory Visit (HOSPITAL_COMMUNITY)
Admission: RE | Admit: 2013-04-11 | Discharge: 2013-04-11 | Disposition: A | Discharge status: Home / Self Care | Payer: Medicare Other | Source: Ambulatory Visit | Attending: Family Medicine | Admitting: Family Medicine

## 2013-04-11 DIAGNOSIS — R059 Cough, unspecified: Secondary | ICD-10-CM | POA: Diagnosis not present

## 2013-04-11 DIAGNOSIS — R05 Cough: Secondary | ICD-10-CM | POA: Insufficient documentation

## 2013-04-11 DIAGNOSIS — I10 Essential (primary) hypertension: Secondary | ICD-10-CM | POA: Insufficient documentation

## 2013-04-11 DIAGNOSIS — E785 Hyperlipidemia, unspecified: Secondary | ICD-10-CM | POA: Diagnosis not present

## 2013-04-11 DIAGNOSIS — J40 Bronchitis, not specified as acute or chronic: Secondary | ICD-10-CM | POA: Diagnosis not present

## 2013-04-11 DIAGNOSIS — J42 Unspecified chronic bronchitis: Secondary | ICD-10-CM

## 2013-04-12 ENCOUNTER — Other Ambulatory Visit: Payer: Self-pay

## 2013-04-12 MED ORDER — FLUTICASONE PROPIONATE 50 MCG/ACT NA SUSP: NASAL | Status: DC

## 2013-04-16 NOTE — Assessment & Plan Note (Signed)
10 day antibiotic course prescribed, pt also to flush daily with saline

## 2013-04-16 NOTE — Assessment & Plan Note (Signed)
Marked improvement, pt applauded, she is to continue low fat diet

## 2013-04-16 NOTE — Assessment & Plan Note (Signed)
Controlled, no change in medication  

## 2013-04-16 NOTE — Assessment & Plan Note (Signed)
Anti inflammatories in office and orally following visit, pt to call if unresolved on completion of treatment

## 2013-04-16 NOTE — Assessment & Plan Note (Signed)
Decongestant , antibiotic and CXR based on symptom duration. Sputum to be sent if not cleared after treatment

## 2013-05-15 ENCOUNTER — Other Ambulatory Visit: Payer: Self-pay | Admitting: Family Medicine

## 2013-05-19 ENCOUNTER — Other Ambulatory Visit: Payer: Self-pay | Admitting: Family Medicine

## 2013-06-19 ENCOUNTER — Other Ambulatory Visit: Payer: Self-pay | Admitting: Family Medicine

## 2013-07-03 ENCOUNTER — Other Ambulatory Visit: Payer: Self-pay

## 2013-07-03 MED ORDER — HYDROCODONE-ACETAMINOPHEN 5-325 MG PO TABS: ORAL_TABLET | ORAL | Status: DC

## 2013-07-05 ENCOUNTER — Other Ambulatory Visit: Payer: Self-pay | Admitting: Family Medicine

## 2013-07-11 ENCOUNTER — Encounter: Payer: Self-pay | Admitting: Family Medicine

## 2013-07-11 ENCOUNTER — Ambulatory Visit (INDEPENDENT_AMBULATORY_CARE_PROVIDER_SITE_OTHER): Payer: Medicare Other | Admitting: Family Medicine

## 2013-07-11 VITALS — BP 140/80 | HR 90 | Resp 18 | Ht 65.5 in | Wt 173.0 lb

## 2013-07-11 DIAGNOSIS — M8580 Other specified disorders of bone density and structure, unspecified site: Secondary | ICD-10-CM

## 2013-07-11 DIAGNOSIS — F3289 Other specified depressive episodes: Secondary | ICD-10-CM

## 2013-07-11 DIAGNOSIS — M899 Disorder of bone, unspecified: Secondary | ICD-10-CM

## 2013-07-11 DIAGNOSIS — I10 Essential (primary) hypertension: Secondary | ICD-10-CM

## 2013-07-11 DIAGNOSIS — M949 Disorder of cartilage, unspecified: Secondary | ICD-10-CM

## 2013-07-11 DIAGNOSIS — E785 Hyperlipidemia, unspecified: Secondary | ICD-10-CM | POA: Diagnosis not present

## 2013-07-11 DIAGNOSIS — D539 Nutritional anemia, unspecified: Secondary | ICD-10-CM

## 2013-07-11 DIAGNOSIS — Z13820 Encounter for screening for osteoporosis: Secondary | ICD-10-CM | POA: Diagnosis not present

## 2013-07-11 DIAGNOSIS — F329 Major depressive disorder, single episode, unspecified: Secondary | ICD-10-CM

## 2013-07-11 DIAGNOSIS — M549 Dorsalgia, unspecified: Secondary | ICD-10-CM | POA: Diagnosis not present

## 2013-07-11 MED ORDER — LISINOPRIL 20 MG PO TABS: ORAL_TABLET | ORAL | Status: DC

## 2013-07-11 MED ORDER — METHYLPREDNISOLONE ACETATE 80 MG/ML IJ SUSP
80.0000 mg | Freq: Once | INTRAMUSCULAR | Status: AC
  Administered 2013-07-11: 80 mg via INTRAMUSCULAR

## 2013-07-11 MED ORDER — TOPIRAMATE 25 MG PO TABS: ORAL_TABLET | ORAL | Status: DC

## 2013-07-11 MED ORDER — PREDNISONE (PAK) 5 MG PO TABS: 5.0000 mg | ORAL_TABLET | ORAL | Status: DC

## 2013-07-11 NOTE — Patient Instructions (Signed)
F/U in 3.5 months, call if you need me before  It is important that you exercise regularly at least 30 minutes 5 times a week. If you develop chest pain, have severe difficulty breathing, or feel very tired, stop exercising immediately and seek medical attention    A healthy diet is rich in fruit, vegetables and whole grains. Poultry fish, nuts and beans are a healthy choice for protein rather then red meat. A low sodium diet and drinking 64 ounces of water daily is generally recommended. Oils and sweet should be limited. Carbohydrates especially for those who are diabetic or overweight, should be limited to 45 to 60 gram per meal. It is important to eat on a regular schedule, at least 3 times daily. Snacks should be primarily fruits, vegetables or nuts.  Adequate rest, generally 6 to 8 hours per night is important for good health.Good sleep hygiene involves setting a regular bedtime, and turning off all sound and light in your sleep environment.Limiting caffeine intake will also help with the ability to rest well  Lab work needs to be done as soon as possible, fasting lipid,and  Cmp,CBC, third week in April if not today Depomedrol today for back pain followed by short course of prednisone  You are referred for a bone density test.  Please call back if you decide to see a therapist, I believe this will be very beneficial  All medications need to be brought to every visit

## 2013-07-19 ENCOUNTER — Ambulatory Visit (HOSPITAL_COMMUNITY)
Admission: RE | Admit: 2013-07-19 | Discharge: 2013-07-19 | Disposition: A | Discharge status: Home / Self Care | Payer: Medicare Other | Source: Ambulatory Visit | Attending: Family Medicine | Admitting: Family Medicine

## 2013-07-19 DIAGNOSIS — Z13820 Encounter for screening for osteoporosis: Secondary | ICD-10-CM | POA: Insufficient documentation

## 2013-07-23 ENCOUNTER — Encounter: Payer: Self-pay | Admitting: Family Medicine

## 2013-07-23 DIAGNOSIS — M8580 Other specified disorders of bone density and structure, unspecified site: Secondary | ICD-10-CM | POA: Insufficient documentation

## 2013-07-26 ENCOUNTER — Other Ambulatory Visit: Payer: Self-pay

## 2013-07-26 MED ORDER — ALENDRONATE SODIUM 70 MG PO TABS: 70.0000 mg | ORAL_TABLET | ORAL | Status: DC

## 2013-08-29 ENCOUNTER — Other Ambulatory Visit: Payer: Self-pay | Admitting: Family Medicine

## 2013-08-29 DIAGNOSIS — D539 Nutritional anemia, unspecified: Secondary | ICD-10-CM | POA: Diagnosis not present

## 2013-08-29 DIAGNOSIS — E785 Hyperlipidemia, unspecified: Secondary | ICD-10-CM | POA: Diagnosis not present

## 2013-08-29 DIAGNOSIS — I10 Essential (primary) hypertension: Secondary | ICD-10-CM | POA: Diagnosis not present

## 2013-08-29 LAB — LIPID PANEL
Cholesterol: 208 mg/dL — ABNORMAL HIGH (ref 0–200)
HDL: 62 mg/dL (ref >39)
LDL CALC: 124 mg/dL — AB (ref 0–99)
Total CHOL/HDL Ratio: 3.4 Ratio
Triglycerides: 110 mg/dL (ref <150)
VLDL: 22 mg/dL (ref 0–40)

## 2013-08-29 LAB — COMPREHENSIVE METABOLIC PANEL
ALBUMIN: 4.1 g/dL (ref 3.5–5.2)
ALT: 15 U/L (ref 0–35)
AST: 17 U/L (ref 0–37)
Alkaline Phosphatase: 60 U/L (ref 39–117)
BUN: 23 mg/dL (ref 6–23)
CHLORIDE: 103 meq/L (ref 96–112)
CO2: 28 mEq/L (ref 19–32)
Calcium: 9.6 mg/dL (ref 8.4–10.5)
Creat: 0.91 mg/dL (ref 0.50–1.10)
Glucose, Bld: 91 mg/dL (ref 70–99)
POTASSIUM: 4.4 meq/L (ref 3.5–5.3)
Sodium: 136 mEq/L (ref 135–145)
Total Bilirubin: 0.4 mg/dL (ref 0.2–1.2)
Total Protein: 6.8 g/dL (ref 6.0–8.3)

## 2013-08-29 LAB — CBC
HEMATOCRIT: 34.8 % — AB (ref 36.0–46.0)
Hemoglobin: 11.4 g/dL — ABNORMAL LOW (ref 12.0–15.0)
MCH: 28.4 pg (ref 26.0–34.0)
MCHC: 32.8 g/dL (ref 30.0–36.0)
MCV: 86.8 fL (ref 78.0–100.0)
PLATELETS: 428 10*3/uL — AB (ref 150–400)
RBC: 4.01 MIL/uL (ref 3.87–5.11)
RDW: 15.2 % (ref 11.5–15.5)
WBC: 5.1 10*3/uL (ref 4.0–10.5)

## 2013-08-30 LAB — FERRITIN: Ferritin: 6 ng/mL — ABNORMAL LOW (ref 10–291)

## 2013-08-30 LAB — IRON: IRON: 57 ug/dL (ref 42–145)

## 2013-08-31 ENCOUNTER — Other Ambulatory Visit: Payer: Self-pay | Admitting: Family Medicine

## 2013-09-01 ENCOUNTER — Other Ambulatory Visit: Payer: Self-pay | Admitting: Family Medicine

## 2013-09-04 ENCOUNTER — Other Ambulatory Visit: Payer: Self-pay

## 2013-09-04 MED ORDER — TRAMADOL HCL 50 MG PO TABS: ORAL_TABLET | ORAL | Status: DC

## 2013-09-10 NOTE — Assessment & Plan Note (Signed)
Updated dexa needed, esp in light of recurrent exposure to steroids for pain Regular weight bearing activity and daily calcium with D encouraged

## 2013-09-10 NOTE — Assessment & Plan Note (Signed)
Deteriorated. Adherence to medication regime as well as reduction in fat intake discussed and encouraged

## 2013-09-10 NOTE — Progress Notes (Signed)
   Subjective:    Patient ID: Sara Woods, female    DOB: 04-22-1946, 68 y.o.   MRN: [id number]  HPI The PT is here for follow up and re-evaluation of chronic medical conditions, medication management and review of any available recent lab and radiology data.  Preventive health is updated, specifically  Cancer screening and Immunization.    The PT denies any adverse reactions to current medications since the last visit.  There are no new concerns.  C/o uncontrolled and increase low back pain radiating down both legs x past 1 week. Denies incontinence of stool or urine , there is no lower extremity weakness or numbness. Pt is aware that her mental health could and should be better, often feels down espescialy she states due to a lot of illness in people she knows, unwilling and not interested in psychotherapy currently , but is open to change    Review of Systems See HPI Denies recent fever or chills. Denies sinus pressure, nasal congestion, ear pain or sore throat. Denies chest congestion, productive cough or wheezing. Denies chest pains, palpitations and leg swelling Denies abdominal pain, nausea, vomiting,diarrhea or constipation.   Denies dysuria, frequency, hesitancy or incontinence.  Denies skin break down or rash.        Objective:   Physical Exam  BP 140/80  Pulse 90  Resp 18  Ht 5' 5.5" (1.664 m)  Wt 173 lb 0.6 oz (78.49 kg)  BMI 28.35 kg/m2  SpO2 95% Patient alert and oriented and in no cardiopulmonary distress.P tin pain  HEENT: No facial asymmetry, EOMI, no sinus tenderness,  oropharynx pink and moist.  Neck supple no adenopathy.  Chest: Clear to auscultation bilaterally.  CVS: S1, S2 no murmurs, no S3.  ABD: Soft non tender. Bowel sounds normal.  Ext: No edema  MS: decreased  ROM lumbar spine,  hips and knees.Adequate in shoulder  Skin: Intact, no ulcerations or rash noted.  Psych: Good eye contact, normal affect. Memory intact not anxious, but at  times tearful and depressed appearing.  CNS: CN 2-12 intact, power, tone and sensation normal throughout.       Assessment & Plan:  HYPERTENSION Controlled, no change in medication DASH diet and commitment to daily physical activity for a minimum of 30 minutes discussed and encouraged, as a part of hypertension management. The importance of attaining a healthy weight is also discussed.   Back pain with radiation Uncontrolled ,recommend imaging study but pt not interested at this time Depo medrol in office followed by ashort course of prednisone. N No change in hydrocodone prescription  HYPERLIPIDEMIA Deteriorated. Adherence to medication regime as well as reduction in fat intake discussed and encouraged  DEPRESSION prozac to be continued. Therapy is recommended , which I know will be very beneficial, however no interest at this time Pt is neither suicidal nor homicidal  Osteopenia Updated dexa needed, esp in light of recurrent exposure to steroids for pain Regular weight bearing activity and daily calcium with D encouraged

## 2013-09-10 NOTE — Assessment & Plan Note (Signed)
Controlled, no change in medication DASH diet and commitment to daily physical activity for a minimum of 30 minutes discussed and encouraged, as a part of hypertension management. The importance of attaining a healthy weight is also discussed.  

## 2013-09-10 NOTE — Assessment & Plan Note (Signed)
Uncontrolled ,recommend imaging study but pt not interested at this time Depo medrol in office followed by ashort course of prednisone. N No change in hydrocodone prescription

## 2013-09-10 NOTE — Assessment & Plan Note (Signed)
prozac to be continued. Therapy is recommended , which I know will be very beneficial, however no interest at this time Pt is neither suicidal nor homicidal

## 2013-09-11 ENCOUNTER — Telehealth: Payer: Self-pay

## 2013-09-11 DIAGNOSIS — Z1211 Encounter for screening for malignant neoplasm of colon: Secondary | ICD-10-CM

## 2013-09-11 LAB — HEMOCCULT GUIAC POC 1CARD (OFFICE)
Card #2 Fecal Occult Blod, POC: NEGATIVE
FECAL OCCULT BLD: NEGATIVE
Fecal Occult Blood, POC: NEGATIVE

## 2013-09-11 NOTE — Telephone Encounter (Signed)
Ok toradol 60mg  and depo medrol 80 mg iM also send in prednisone 5 mg twicew daily to follow injections #10, pls let her know.

## 2013-09-11 NOTE — Telephone Encounter (Signed)
Called and notified husband who will give the message to patient.  She will come in the am 5/5

## 2013-09-12 ENCOUNTER — Ambulatory Visit (INDEPENDENT_AMBULATORY_CARE_PROVIDER_SITE_OTHER): Payer: Medicare Other

## 2013-09-12 ENCOUNTER — Other Ambulatory Visit: Payer: Self-pay

## 2013-09-12 VITALS — BP 108/68 | Wt 173.0 lb

## 2013-09-12 DIAGNOSIS — M25552 Pain in left hip: Secondary | ICD-10-CM

## 2013-09-12 DIAGNOSIS — M25559 Pain in unspecified hip: Secondary | ICD-10-CM

## 2013-09-12 MED ORDER — PREDNISONE 5 MG PO TABS: 5.0000 mg | ORAL_TABLET | Freq: Two times a day (BID) | ORAL | Status: DC

## 2013-09-12 MED ORDER — KETOROLAC TROMETHAMINE 60 MG/2ML IM SOLN
60.0000 mg | Freq: Once | INTRAMUSCULAR | Status: AC
  Administered 2013-09-12: 60 mg via INTRAMUSCULAR

## 2013-09-12 MED ORDER — METHYLPREDNISOLONE ACETATE 80 MG/ML IJ SUSP
80.0000 mg | Freq: Once | INTRAMUSCULAR | Status: AC
  Administered 2013-09-12: 80 mg via INTRAMUSCULAR

## 2013-09-12 NOTE — Progress Notes (Signed)
Patient in for injections for left hip pain.  Pain 8 on scale of 1-10.  Duration 2 weeks.  Prednisone sent to the pharmacy.  Patient aware.  No signs or syptoms of adverse reaction to injections noted.

## 2013-09-12 NOTE — Telephone Encounter (Signed)
Prednisone sent to [facility] in [place]

## 2013-09-18 ENCOUNTER — Other Ambulatory Visit: Payer: Self-pay | Admitting: Family Medicine

## 2013-10-12 ENCOUNTER — Other Ambulatory Visit: Payer: Self-pay | Admitting: Family Medicine

## 2013-10-26 ENCOUNTER — Ambulatory Visit: Payer: Medicare Other | Admitting: Family Medicine

## 2013-11-16 ENCOUNTER — Other Ambulatory Visit: Payer: Self-pay | Admitting: Family Medicine

## 2013-11-23 DIAGNOSIS — D539 Nutritional anemia, unspecified: Secondary | ICD-10-CM | POA: Diagnosis not present

## 2013-11-23 LAB — CBC WITH DIFFERENTIAL/PLATELET
BASOS PCT: 1 % (ref 0–1)
Basophils Absolute: 0.1 10*3/uL (ref 0.0–0.1)
Eosinophils Absolute: 0.1 10*3/uL (ref 0.0–0.7)
Eosinophils Relative: 2 % (ref 0–5)
HEMATOCRIT: 36.4 % (ref 36.0–46.0)
Hemoglobin: 12.2 g/dL (ref 12.0–15.0)
Lymphocytes Relative: 30 % (ref 12–46)
Lymphs Abs: 1.6 10*3/uL (ref 0.7–4.0)
MCH: 30.6 pg (ref 26.0–34.0)
MCHC: 33.5 g/dL (ref 30.0–36.0)
MCV: 91.2 fL (ref 78.0–100.0)
MONO ABS: 0.3 10*3/uL (ref 0.1–1.0)
Monocytes Relative: 6 % (ref 3–12)
NEUTROS ABS: 3.2 10*3/uL (ref 1.7–7.7)
Neutrophils Relative %: 61 % (ref 43–77)
Platelets: 337 10*3/uL (ref 150–400)
RBC: 3.99 MIL/uL (ref 3.87–5.11)
RDW: 14.7 % (ref 11.5–15.5)
WBC: 5.3 10*3/uL (ref 4.0–10.5)

## 2013-11-23 LAB — IRON: IRON: 91 ug/dL (ref 42–145)

## 2013-11-23 LAB — FERRITIN: Ferritin: 24 ng/mL (ref 10–291)

## 2013-11-27 ENCOUNTER — Other Ambulatory Visit: Payer: Self-pay | Admitting: Family Medicine

## 2013-12-06 ENCOUNTER — Ambulatory Visit (INDEPENDENT_AMBULATORY_CARE_PROVIDER_SITE_OTHER): Payer: Medicare Other | Admitting: Family Medicine

## 2013-12-06 ENCOUNTER — Encounter: Payer: Self-pay | Admitting: Family Medicine

## 2013-12-06 VITALS — BP 106/74 | HR 86 | Resp 18 | Ht 65.5 in | Wt 166.1 lb

## 2013-12-06 DIAGNOSIS — J3089 Other allergic rhinitis: Secondary | ICD-10-CM

## 2013-12-06 DIAGNOSIS — M549 Dorsalgia, unspecified: Secondary | ICD-10-CM

## 2013-12-06 DIAGNOSIS — Z23 Encounter for immunization: Secondary | ICD-10-CM | POA: Insufficient documentation

## 2013-12-06 DIAGNOSIS — D649 Anemia, unspecified: Secondary | ICD-10-CM | POA: Diagnosis not present

## 2013-12-06 DIAGNOSIS — F341 Dysthymic disorder: Secondary | ICD-10-CM

## 2013-12-06 DIAGNOSIS — E785 Hyperlipidemia, unspecified: Secondary | ICD-10-CM

## 2013-12-06 DIAGNOSIS — F418 Other specified anxiety disorders: Secondary | ICD-10-CM

## 2013-12-06 DIAGNOSIS — I10 Essential (primary) hypertension: Secondary | ICD-10-CM

## 2013-12-06 MED ORDER — PREDNISONE 5 MG PO TABS: 5.0000 mg | ORAL_TABLET | Freq: Two times a day (BID) | ORAL | Status: AC

## 2013-12-06 MED ORDER — HYDROCODONE-ACETAMINOPHEN 5-325 MG PO TABS: ORAL_TABLET | ORAL | Status: DC

## 2013-12-06 MED ORDER — KETOROLAC TROMETHAMINE 60 MG/2ML IM SOLN
60.0000 mg | Freq: Once | INTRAMUSCULAR | Status: AC
Start: 2013-12-06 — End: 2013-12-06
  Administered 2013-12-06: 60 mg via INTRAMUSCULAR

## 2013-12-06 NOTE — Assessment & Plan Note (Signed)
Uncontrolled Toradol 60mg  Im and prednisone for 5 days

## 2013-12-06 NOTE — Progress Notes (Signed)
   Subjective:    Patient ID: Sara Woods, female    DOB: 08/05/1945, 68 y.o.   MRN: [id number]  HPI The PT is here for follow up and re-evaluation of chronic medical conditions, medication management and review of any available recent lab and radiology data.  Preventive health is updated, specifically  Cancer screening and Immunization. Prevnar today  . The PT denies any adverse reactions to current medications since the last visit.  There are no new concerns.  C/o increased back pian in past 2 weeks, requests medication in the office for this, no specific trauma    Review of Systems See HPI  Denies recent fever or chills. Denies sinus pressure, nasal congestion, ear pain or sore throat. Denies chest congestion, productive cough or wheezing. Denies chest pains, palpitations and leg swelling Denies abdominal pain, nausea, vomiting,diarrhea or constipation.   Denies dysuria, frequency, hesitancy or incontinence. C/o back pain, swelling and limitation in mobility. Denies headaches, seizures, numbness, or tingling. Denies depression, anxiety or insomnia. Denies skin break down or rash.        Objective:   Physical Exam BP 106/74  Pulse 86  Resp 18  Ht 5' 5.5" (1.664 m)  Wt 166 lb 1.9 oz (75.352 kg)  BMI 27.21 kg/m2  SpO2 97% Patient alert and oriented and in no cardiopulmonary distress.  HEENT: No facial asymmetry, EOMI,   oropharynx pink and moist.  Neck supple no JVD, no mass.  Chest: Clear to auscultation bilaterally.  CVS: S1, S2 no murmurs, no S3.Regular rate.  ABD: Soft non tender.   Ext: No edema  MS: decreased  ROM spine,adeuate in  shoulders, hips and knees.  Skin: Intact, no ulcerations or rash noted.  Psych: Good eye contact, normal affect. Memory intact not anxious or depressed appearing.  CNS: CN 2-12 intact, power,  normal throughout.no focal deficits noted.        Assessment & Plan:  Back pain with radiation Uncontrolled Toradol 60mg   Im and prednisone for 5 days  HYPERTENSION Controlled, no change in medication DASH diet and commitment to daily physical activity for a minimum of 30 minutes discussed and encouraged, as a part of hypertension management. The importance of attaining a healthy weight is also discussed.   Need for vaccination with 13-polyvalent pneumococcal conjugate vaccine Vaccine administered  Depression with anxiety Controlled, no change in medication   Hyperlipidemia LDL goal <100 Hyperlipidemia:Low fat diet discussed and encouraged.  Updated lab needed at/ before next visit.   Allergic rhinitis Controlled, no change in medication

## 2013-12-06 NOTE — Assessment & Plan Note (Signed)
Controlled, no change in medication DASH diet and commitment to daily physical activity for a minimum of 30 minutes discussed and encouraged, as a part of hypertension management. The importance of attaining a healthy weight is also discussed.  

## 2013-12-06 NOTE — Assessment & Plan Note (Signed)
Vaccine administered.

## 2013-12-06 NOTE — Patient Instructions (Addendum)
Annual wellness in [place], call if you need me before Toradol today and prednisone sent in for back pain  Blood count is now normal you are no longer anemic, ok to to continue iron one daily intuil you have finished all tha t you have  Fasting lipid, chm 7, HB in November before visit  I hope things will get better soon  Prevnar today

## 2013-12-08 ENCOUNTER — Other Ambulatory Visit: Payer: Self-pay | Admitting: Family Medicine

## 2013-12-11 ENCOUNTER — Other Ambulatory Visit: Payer: Self-pay

## 2013-12-11 MED ORDER — FLUOXETINE HCL 20 MG PO CAPS: ORAL_CAPSULE | ORAL | Status: DC

## 2013-12-11 MED ORDER — DOXEPIN HCL 10 MG PO CAPS: ORAL_CAPSULE | ORAL | Status: DC

## 2013-12-11 MED ORDER — FLUTICASONE PROPIONATE 50 MCG/ACT NA SUSP: NASAL | Status: DC

## 2014-01-29 ENCOUNTER — Other Ambulatory Visit: Payer: Self-pay | Admitting: Family Medicine

## 2014-01-30 ENCOUNTER — Other Ambulatory Visit: Payer: Self-pay | Admitting: Family Medicine

## 2014-02-12 ENCOUNTER — Other Ambulatory Visit: Payer: Self-pay | Admitting: Family Medicine

## 2014-02-15 ENCOUNTER — Other Ambulatory Visit: Payer: Self-pay | Admitting: Family Medicine

## 2014-03-01 ENCOUNTER — Other Ambulatory Visit: Payer: Self-pay | Admitting: Family Medicine

## 2014-03-03 NOTE — Assessment & Plan Note (Signed)
Controlled, no change in medication  

## 2014-03-03 NOTE — Assessment & Plan Note (Signed)
Hyperlipidemia:Low fat diet discussed and encouraged.  Updated lab needed at/ before next visit.  

## 2014-03-20 ENCOUNTER — Other Ambulatory Visit: Payer: Self-pay | Admitting: Family Medicine

## 2014-03-20 ENCOUNTER — Encounter: Payer: Medicare Other | Admitting: Family Medicine

## 2014-03-30 ENCOUNTER — Other Ambulatory Visit: Payer: Self-pay

## 2014-03-30 MED ORDER — HYDROCODONE-ACETAMINOPHEN 5-325 MG PO TABS: ORAL_TABLET | ORAL | Status: DC

## 2014-04-02 DIAGNOSIS — L570 Actinic keratosis: Secondary | ICD-10-CM | POA: Diagnosis not present

## 2014-04-02 DIAGNOSIS — L821 Other seborrheic keratosis: Secondary | ICD-10-CM | POA: Diagnosis not present

## 2014-04-09 ENCOUNTER — Other Ambulatory Visit: Payer: Self-pay | Admitting: Family Medicine

## 2014-04-19 ENCOUNTER — Ambulatory Visit (INDEPENDENT_AMBULATORY_CARE_PROVIDER_SITE_OTHER): Payer: Medicare Other

## 2014-04-19 ENCOUNTER — Ambulatory Visit: Payer: Medicare Other

## 2014-04-19 DIAGNOSIS — Z23 Encounter for immunization: Secondary | ICD-10-CM

## 2014-04-27 ENCOUNTER — Other Ambulatory Visit: Payer: Self-pay | Admitting: Family Medicine

## 2014-05-01 DIAGNOSIS — I10 Essential (primary) hypertension: Secondary | ICD-10-CM | POA: Diagnosis not present

## 2014-05-01 DIAGNOSIS — E785 Hyperlipidemia, unspecified: Secondary | ICD-10-CM | POA: Diagnosis not present

## 2014-05-01 DIAGNOSIS — D649 Anemia, unspecified: Secondary | ICD-10-CM | POA: Diagnosis not present

## 2014-05-02 LAB — LIPID PANEL
Cholesterol: 181 mg/dL (ref 0–200)
HDL: 43 mg/dL (ref >39)
LDL Cholesterol: 100 mg/dL — ABNORMAL HIGH (ref 0–99)
Total CHOL/HDL Ratio: 4.2 Ratio
Triglycerides: 190 mg/dL — ABNORMAL HIGH (ref <150)
VLDL: 38 mg/dL (ref 0–40)

## 2014-05-02 LAB — BASIC METABOLIC PANEL
BUN: 26 mg/dL — ABNORMAL HIGH (ref 6–23)
CO2: 28 meq/L (ref 19–32)
Calcium: 9.5 mg/dL (ref 8.4–10.5)
Chloride: 108 mEq/L (ref 96–112)
Creat: 0.97 mg/dL (ref 0.50–1.10)
Glucose, Bld: 82 mg/dL (ref 70–99)
Potassium: 4.1 mEq/L (ref 3.5–5.3)
Sodium: 142 mEq/L (ref 135–145)

## 2014-05-02 LAB — HEMOGLOBIN: HEMOGLOBIN: 11.6 g/dL — AB (ref 12.0–15.0)

## 2014-05-05 ENCOUNTER — Other Ambulatory Visit: Payer: Self-pay | Admitting: Family Medicine

## 2014-05-09 ENCOUNTER — Other Ambulatory Visit: Payer: Self-pay | Admitting: Family Medicine

## 2014-05-21 ENCOUNTER — Other Ambulatory Visit: Payer: Self-pay | Admitting: Family Medicine

## 2014-05-29 ENCOUNTER — Encounter: Payer: Self-pay | Admitting: Family Medicine

## 2014-05-29 ENCOUNTER — Other Ambulatory Visit (HOSPITAL_COMMUNITY)
Admission: RE | Admit: 2014-05-29 | Discharge: 2014-05-29 | Disposition: A | Discharge status: Home / Self Care | Payer: Medicare Other | Source: Ambulatory Visit | Attending: Family Medicine | Admitting: Family Medicine

## 2014-05-29 ENCOUNTER — Ambulatory Visit (INDEPENDENT_AMBULATORY_CARE_PROVIDER_SITE_OTHER): Payer: Medicare Other | Admitting: Family Medicine

## 2014-05-29 ENCOUNTER — Other Ambulatory Visit: Payer: Self-pay | Admitting: Family Medicine

## 2014-05-29 VITALS — BP 110/72 | HR 70 | Resp 18 | Ht 66.0 in | Wt 169.0 lb

## 2014-05-29 DIAGNOSIS — Z1239 Encounter for other screening for malignant neoplasm of breast: Secondary | ICD-10-CM

## 2014-05-29 DIAGNOSIS — Z124 Encounter for screening for malignant neoplasm of cervix: Secondary | ICD-10-CM | POA: Diagnosis not present

## 2014-05-29 DIAGNOSIS — E785 Hyperlipidemia, unspecified: Secondary | ICD-10-CM | POA: Diagnosis not present

## 2014-05-29 DIAGNOSIS — I10 Essential (primary) hypertension: Secondary | ICD-10-CM | POA: Diagnosis not present

## 2014-05-29 DIAGNOSIS — Z01419 Encounter for gynecological examination (general) (routine) without abnormal findings: Secondary | ICD-10-CM

## 2014-05-29 DIAGNOSIS — G8929 Other chronic pain: Secondary | ICD-10-CM | POA: Insufficient documentation

## 2014-05-29 DIAGNOSIS — Z0000 Encounter for general adult medical examination without abnormal findings: Secondary | ICD-10-CM

## 2014-05-29 DIAGNOSIS — Z1211 Encounter for screening for malignant neoplasm of colon: Secondary | ICD-10-CM

## 2014-05-29 DIAGNOSIS — M549 Dorsalgia, unspecified: Secondary | ICD-10-CM

## 2014-05-29 LAB — HEMOCCULT GUIAC POC 1CARD (OFFICE): Fecal Occult Blood, POC: NEGATIVE

## 2014-05-29 MED ORDER — MELOXICAM 7.5 MG PO TABS: 7.5000 mg | ORAL_TABLET | Freq: Every day | ORAL | Status: DC

## 2014-05-29 MED ORDER — KETOROLAC TROMETHAMINE 60 MG/2ML IM SOLN
60.0000 mg | Freq: Once | INTRAMUSCULAR | Status: AC
  Administered 2014-05-29: 60 mg via INTRAMUSCULAR

## 2014-05-29 MED ORDER — HYDROCODONE-ACETAMINOPHEN 5-325 MG PO TABS: ORAL_TABLET | ORAL | Status: DC

## 2014-05-29 MED ORDER — METHYLPREDNISOLONE ACETATE 80 MG/ML IJ SUSP
80.0000 mg | Freq: Once | INTRAMUSCULAR | Status: AC
  Administered 2014-05-29: 80 mg via INTRAMUSCULAR

## 2014-05-29 NOTE — Progress Notes (Signed)
   Subjective:    Patient ID: Sara Woods, female    DOB: [birth year], 69 y.o.   MRN: [id number]  HPI Patient is in for pelvic and breast exam. C/o increased and uncontrolled back pain, states she is in pain all the time and feels would do better with once dialy pain pill, also needs to change to "safer" anti inflammatory" based on new guidelines  Review of Systems See HPI     Objective:   Physical Exam BP 110/72 mmHg  Pulse 70  Resp 18  Ht 5\' 6"  (1.676 m)  Wt 169 lb (76.658 kg)  BMI 27.29 kg/m2  SpO2 96%  Pleasant well nourished female, alert and oriented x 3, in no cardio-pulmonary distress. Afebrile.Pt in  pain  Breast: No asymetry,no masses or lumps. No tenderness. No nipple discharge or inversion. No axillary or supraclavicular adenopathy    Rectal:  Normal sphincter tone. No mass.No rectal masses.  Guaiac negative stool.  GU: External genitalia normal female genitalia , female distribution of hair. No lesions. Urethral meatus normal in size, no  Prolapse, no lesions visibly  Present. Bladder non tender. Vagina pink and moist , with no visible lesions , discharge present . Adequate pelvic support no  cystocele or rectocele noted Cervix pink and appears healthy, no lesions or ulcerations noted, no discharge noted from os Uterus normal size, no adnexal masses, no cervical motion or adnexal tenderness.   Musculoskeletal exam: Decreased  ROM of lumbar  Spine , knees and  hips , normal in  shoulders . No deformity ,swelling or crepitus noted. No muscle wasting or atrophy.         Assessment & Plan:  Annual physical exam Annual exam as documented. Counseling done  re healthy lifestyle involving commitment to 150 minutes exercise per week, heart healthy diet, and attaining healthy weight.The importance of adequate sleep also discussed. Regular seat belt use and home safety, is also discussed. Changes in health habits are decided on by the patient with goals  and time frames  set for achieving them. Immunization and cancer screening needs are specifically addressed at this visit.    Back pain with radiation Uncontrolled Anti inflammatories administered, and medication changes made   Special screening for malignant neoplasms, colon Rectal exam as documented, heme negative stool

## 2014-05-29 NOTE — Patient Instructions (Addendum)
Annual  in  4  Months, call if you need me before  New dosing of hydrocodone is one daily, STOP tramadol  Stop catflam, instead start meloxicam one daily.  TWO INJECTIONS IN OFFICE TODAY FOR PAIN,   pLEASE SCHEDULE MAMMOGRANM NO LATER THAN JULY  Fasting lipid, cmp, CBC, tsh , , in 4 month  All the best for 2016!

## 2014-05-29 NOTE — Assessment & Plan Note (Signed)

## 2014-05-31 LAB — CYTOLOGY - PAP

## 2014-06-02 DIAGNOSIS — Z1211 Encounter for screening for malignant neoplasm of colon: Secondary | ICD-10-CM | POA: Insufficient documentation

## 2014-06-02 NOTE — Assessment & Plan Note (Signed)
Rectal exam as documented, heme negative stool

## 2014-06-02 NOTE — Assessment & Plan Note (Signed)
Uncontrolled Anti inflammatories administered, and medication changes made

## 2014-06-22 ENCOUNTER — Other Ambulatory Visit: Payer: Self-pay

## 2014-06-22 DIAGNOSIS — M549 Dorsalgia, unspecified: Secondary | ICD-10-CM

## 2014-06-22 MED ORDER — HYDROCODONE-ACETAMINOPHEN 5-325 MG PO TABS: ORAL_TABLET | ORAL | Status: DC

## 2014-07-05 ENCOUNTER — Other Ambulatory Visit: Payer: Self-pay | Admitting: Family Medicine

## 2014-07-11 DIAGNOSIS — H40033 Anatomical narrow angle, bilateral: Secondary | ICD-10-CM | POA: Diagnosis not present

## 2014-07-11 DIAGNOSIS — H43393 Other vitreous opacities, bilateral: Secondary | ICD-10-CM | POA: Diagnosis not present

## 2014-07-16 ENCOUNTER — Other Ambulatory Visit: Payer: Self-pay | Admitting: Family Medicine

## 2014-07-19 DIAGNOSIS — G44219 Episodic tension-type headache, not intractable: Secondary | ICD-10-CM | POA: Diagnosis not present

## 2014-07-27 ENCOUNTER — Other Ambulatory Visit: Payer: Self-pay

## 2014-07-27 DIAGNOSIS — M549 Dorsalgia, unspecified: Secondary | ICD-10-CM

## 2014-07-27 MED ORDER — HYDROCODONE-ACETAMINOPHEN 5-325 MG PO TABS: ORAL_TABLET | ORAL | Status: DC

## 2014-07-30 ENCOUNTER — Other Ambulatory Visit: Payer: Self-pay | Admitting: Family Medicine

## 2014-08-07 ENCOUNTER — Other Ambulatory Visit: Payer: Self-pay | Admitting: Family Medicine

## 2014-08-24 ENCOUNTER — Other Ambulatory Visit: Payer: Self-pay

## 2014-08-24 MED ORDER — PRAVASTATIN SODIUM 80 MG PO TABS: 80.0000 mg | ORAL_TABLET | Freq: Every evening | ORAL | Status: DC

## 2014-08-24 MED ORDER — DOXEPIN HCL 10 MG PO CAPS: ORAL_CAPSULE | ORAL | Status: DC

## 2014-08-28 ENCOUNTER — Other Ambulatory Visit: Payer: Self-pay | Admitting: Family Medicine

## 2014-08-30 ENCOUNTER — Telehealth: Payer: Self-pay | Admitting: *Deleted

## 2014-08-30 NOTE — Telephone Encounter (Signed)
Pt called stating she has had sinuses going on for about 4 or 5 days and her nose has started bleeding very bad, pt is requesting to speak with a nurse. Please advise [phone]

## 2014-08-31 ENCOUNTER — Other Ambulatory Visit: Payer: Self-pay

## 2014-08-31 DIAGNOSIS — M549 Dorsalgia, unspecified: Secondary | ICD-10-CM

## 2014-08-31 MED ORDER — HYDROCODONE-ACETAMINOPHEN 5-325 MG PO TABS: ORAL_TABLET | ORAL | Status: DC

## 2014-08-31 MED ORDER — AZITHROMYCIN 250 MG PO TABS: ORAL_TABLET | ORAL | Status: DC

## 2014-08-31 NOTE — Telephone Encounter (Signed)
pls send z pack x 1.e mucinex twice daily with a lot of water for next  5 days, if worsening call to be seen next week

## 2014-08-31 NOTE — Telephone Encounter (Signed)
Pt aware and med sent  

## 2014-08-31 NOTE — Telephone Encounter (Signed)
X Sunday her preacher came to church sick and after that she started with yellow sinus mucus and coughing up greenish yellow phlegm and also when she blows her nose/sneezes there is some bleeding with some clots. Also has felt some feverish the past few days. Wants to know if something can be called in for her?

## 2014-08-31 NOTE — Addendum Note (Signed)
Addended by: Eual Fines on: 08/31/2014 01:23 PM   Modules accepted: Orders

## 2014-09-27 ENCOUNTER — Other Ambulatory Visit: Payer: Self-pay

## 2014-09-27 DIAGNOSIS — M549 Dorsalgia, unspecified: Secondary | ICD-10-CM

## 2014-09-27 MED ORDER — HYDROCODONE-ACETAMINOPHEN 5-325 MG PO TABS: ORAL_TABLET | ORAL | Status: DC

## 2014-10-05 DIAGNOSIS — I10 Essential (primary) hypertension: Secondary | ICD-10-CM | POA: Diagnosis not present

## 2014-10-05 DIAGNOSIS — E785 Hyperlipidemia, unspecified: Secondary | ICD-10-CM | POA: Diagnosis not present

## 2014-10-05 LAB — CBC
HEMATOCRIT: 36.9 % (ref 36.0–46.0)
HEMOGLOBIN: 11.7 g/dL — AB (ref 12.0–15.0)
MCH: 28.8 pg (ref 26.0–34.0)
MCHC: 31.7 g/dL (ref 30.0–36.0)
MCV: 90.9 fL (ref 78.0–100.0)
MPV: 10.6 fL (ref 8.6–12.4)
Platelets: 369 10*3/uL (ref 150–400)
RBC: 4.06 MIL/uL (ref 3.87–5.11)
RDW: 14.4 % (ref 11.5–15.5)
WBC: 4.7 10*3/uL (ref 4.0–10.5)

## 2014-10-06 LAB — COMPREHENSIVE METABOLIC PANEL
ALBUMIN: 4.2 g/dL (ref 3.5–5.2)
ALT: 12 U/L (ref 0–35)
AST: 18 U/L (ref 0–37)
Alkaline Phosphatase: 53 U/L (ref 39–117)
BILIRUBIN TOTAL: 0.4 mg/dL (ref 0.2–1.2)
BUN: 32 mg/dL — ABNORMAL HIGH (ref 6–23)
CO2: 25 meq/L (ref 19–32)
Calcium: 9.5 mg/dL (ref 8.4–10.5)
Chloride: 107 mEq/L (ref 96–112)
Creat: 0.99 mg/dL (ref 0.50–1.10)
Glucose, Bld: 86 mg/dL (ref 70–99)
POTASSIUM: 4.1 meq/L (ref 3.5–5.3)
SODIUM: 142 meq/L (ref 135–145)
Total Protein: 6.6 g/dL (ref 6.0–8.3)

## 2014-10-06 LAB — LIPID PANEL
CHOL/HDL RATIO: 2.4 ratio
Cholesterol: 173 mg/dL (ref 0–200)
HDL: 71 mg/dL (ref >=46)
LDL CALC: 87 mg/dL (ref 0–99)
Triglycerides: 74 mg/dL (ref <150)
VLDL: 15 mg/dL (ref 0–40)

## 2014-10-06 LAB — TSH: TSH: 1.135 u[IU]/mL (ref 0.350–4.500)

## 2014-10-22 ENCOUNTER — Other Ambulatory Visit: Payer: Self-pay | Admitting: Family Medicine

## 2014-10-24 ENCOUNTER — Encounter: Payer: Self-pay | Admitting: Family Medicine

## 2014-10-24 ENCOUNTER — Ambulatory Visit (INDEPENDENT_AMBULATORY_CARE_PROVIDER_SITE_OTHER): Payer: Medicare Other | Admitting: Family Medicine

## 2014-10-24 VITALS — BP 130/74 | HR 64 | Resp 18 | Ht 66.0 in | Wt 168.1 lb

## 2014-10-24 DIAGNOSIS — H547 Unspecified visual loss: Secondary | ICD-10-CM

## 2014-10-24 DIAGNOSIS — Z0000 Encounter for general adult medical examination without abnormal findings: Secondary | ICD-10-CM

## 2014-10-24 DIAGNOSIS — M549 Dorsalgia, unspecified: Secondary | ICD-10-CM | POA: Diagnosis not present

## 2014-10-24 DIAGNOSIS — G43109 Migraine with aura, not intractable, without status migrainosus: Secondary | ICD-10-CM | POA: Insufficient documentation

## 2014-10-24 MED ORDER — METHYLPREDNISOLONE ACETATE 80 MG/ML IJ SUSP
80.0000 mg | Freq: Once | INTRAMUSCULAR | Status: AC
  Administered 2014-10-24: 80 mg via INTRAMUSCULAR

## 2014-10-24 MED ORDER — TOPIRAMATE 50 MG PO TABS: 50.0000 mg | ORAL_TABLET | Freq: Two times a day (BID) | ORAL | Status: DC

## 2014-10-24 MED ORDER — KETOROLAC TROMETHAMINE 60 MG/2ML IM SOLN
60.0000 mg | Freq: Once | INTRAMUSCULAR | Status: AC
  Administered 2014-10-24: 60 mg via INTRAMUSCULAR

## 2014-10-24 MED ORDER — PREDNISONE 5 MG (21) PO TBPK: 5.0000 mg | ORAL_TABLET | ORAL | Status: DC

## 2014-10-24 MED ORDER — GABAPENTIN 300 MG PO CAPS: 300.0000 mg | ORAL_CAPSULE | Freq: Every day | ORAL | Status: DC

## 2014-10-24 NOTE — Patient Instructions (Addendum)
F/u in 4 month, call if you need me before  Injection in office today for uncontrolled back pain and medication also sent in   Additional med gabapentin one at bedtime for pain  Increase in topamax to 50 mg twice daily to help reduce headaches  Pls see eye Doc and also do mammogram  Be safe  No more falls!  No rugs

## 2014-10-24 NOTE — Assessment & Plan Note (Signed)
Uncontrolled.Toradol and depo medrol administered IM in the office , to be followed by a short course of oral prednisone and NSAIDS.  

## 2014-10-24 NOTE — Progress Notes (Signed)
Subjective:    Patient ID: Sara Woods, female    DOB: 1946-04-15, 69 y.o.   MRN: [id number]  HPI Preventive Screening-Counseling & Management   Patient present here today for a Medicare annual wellness visit. C/o increased back pain, fell yesterday, requests injection for pain relief Uncontrolled headaches, on avg 2 to 3 weekly, will increase topamax for better prophylaxis   Current Problems (verified)   Medications Prior to Visit Allergies (verified)   PAST HISTORY  Family History (updated)  Social History Retired Lawyer,  Married    Risk Factors  Current exercise habits:  Encouraged to exercise 30 minutes 5 days a week  Dietary issues discussed:  Heart healthy low fat diet    Cardiac risk factors: CAD in brothers  Depression Screen  (Note: if answer to either of the following is "Yes", a more complete depression screening is indicated)   Over the past two weeks, have you felt down, depressed or hopeless? No  Over the past two weeks, have you felt little interest or pleasure in doing things? No  Have you lost interest or pleasure in daily life? No  Do you often feel hopeless? No  Do you cry easily over simple problems? No   Activities of Daily Living  In your present state of health, do you have any difficulty performing the following activities?  Driving?: No Managing money?: No Feeding yourself?:No Getting from bed to chair?:No Climbing a flight of stairs?: Yes due to back pain Preparing food and eating?:No Bathing or showering?:No Getting dressed?:No Getting to the toilet?:No Using the toilet?:No Moving around from place to place?: Yes due to back pain  Fall Risk Assessment In the past year have you fallen or had a near fall?: Yes, 2 , tripped on her dog yesterday, and skid on her front door mat, which has been thrown out,  Are you currently taking any medications that make you dizzy?:No   Hearing Difficulties: No Do you often ask  people to speak up or repeat themselves?:No Do you experience ringing or noises in your ears?:No Do you have difficulty understanding soft or whispered voices?:No  Cognitive Testing  Alert? Yes Normal Appearance?Yes  Oriented to person? Yes Place? Yes  Time? Yes  Displays appropriate judgment?Yes  Can read the correct time from a watch face? yes Are you having problems remembering things?No  Advanced Directives have been discussed with the patient?Yes and brochure, full code  List the Names of Other Physician/Practitioners you currently use: care teams updated   Indicate any recent Medical Services you may have received from other than [facility] providers in the past year (date may be approximate).   Assessment:    Annual Wellness Exam   Plan:    Medicare Attestation  I have personally reviewed:  The patient's medical and social history  Their use of alcohol, tobacco or illicit drugs  Their current medications and supplements  The patient's functional ability including ADLs,fall risks, home safety risks, cognitive, and hearing and visual impairment  Diet and physical activities  Evidence for depression or mood disorders  The patient's weight, height, BMI, and visual acuity have been recorded in the chart. I have made referrals, counseling, and provided education to the patient based on review of the above and I have provided the patient with a written personalized care plan for preventive services.      Review of Systems     Objective:   Physical Exam  BP 130/74 mmHg  Pulse 64  Resp 18  Ht 5\' 6"  (1.676 m)  Wt 168 lb 1.3 oz (76.241 kg)  BMI 27.14 kg/m2  SpO2 97%  MS: decreased ROM lumbar spine, normal ROM shoulders, hips and knees. No visible skin breakdown  CNS: CN 2 to 12 intact, reduced vision noted however and eye exam recommended Power normal in all 4 extremities      Assessment & Plan:  Medicare annual wellness visit, subsequent Annual exam as  documented. Counseling done  re healthy lifestyle involving commitment to 150 minutes exercise per week, heart healthy diet, and attaining healthy weight.The importance of adequate sleep also discussed. Regular seat belt use and home safety, is also discussed. Changes in health habits are decided on by the patient with goals and time frames  set for achieving them. Immunization and cancer screening needs are specifically addressed at this visit.   Back pain with radiation Uncontrolled.Toradol and depo medrol administered IM in the office , to be followed by a short course of oral prednisone and NSAIDS.   Migraine with aura Inadequately controlled, pt reports 2 to 3 headaches weekly. Increase dose of topamax with follow up Neurologic exam grossly normal at visit, will refer for imaging study and neurologic eval if persists, hopefully she will agree, has refused in the past

## 2014-10-24 NOTE — Assessment & Plan Note (Signed)

## 2014-10-30 ENCOUNTER — Other Ambulatory Visit: Payer: Self-pay | Admitting: Family Medicine

## 2014-11-09 ENCOUNTER — Other Ambulatory Visit: Payer: Self-pay

## 2014-11-09 DIAGNOSIS — M549 Dorsalgia, unspecified: Secondary | ICD-10-CM

## 2014-11-09 MED ORDER — HYDROCODONE-ACETAMINOPHEN 5-325 MG PO TABS: ORAL_TABLET | ORAL | Status: DC

## 2014-11-11 ENCOUNTER — Encounter: Payer: Self-pay | Admitting: Family Medicine

## 2014-11-11 NOTE — Assessment & Plan Note (Signed)
Inadequately controlled, pt reports 2 to 3 headaches weekly. Increase dose of topamax with follow up Neurologic exam grossly normal at visit, will refer for imaging study and neurologic eval if persists, hopefully she will agree, has refused in the past

## 2014-12-19 ENCOUNTER — Other Ambulatory Visit: Payer: Self-pay | Admitting: Family Medicine

## 2014-12-25 ENCOUNTER — Other Ambulatory Visit: Payer: Self-pay | Admitting: Family Medicine

## 2014-12-28 ENCOUNTER — Other Ambulatory Visit: Payer: Self-pay

## 2014-12-28 DIAGNOSIS — M549 Dorsalgia, unspecified: Secondary | ICD-10-CM

## 2014-12-28 MED ORDER — HYDROCODONE-ACETAMINOPHEN 5-325 MG PO TABS: ORAL_TABLET | ORAL | Status: DC

## 2015-01-25 ENCOUNTER — Other Ambulatory Visit: Payer: Self-pay

## 2015-01-25 MED ORDER — DOXEPIN HCL 10 MG PO CAPS: ORAL_CAPSULE | ORAL | Status: DC

## 2015-01-31 ENCOUNTER — Other Ambulatory Visit: Payer: Self-pay

## 2015-01-31 DIAGNOSIS — M549 Dorsalgia, unspecified: Secondary | ICD-10-CM

## 2015-01-31 MED ORDER — HYDROCODONE-ACETAMINOPHEN 5-325 MG PO TABS: ORAL_TABLET | ORAL | Status: DC

## 2015-02-04 ENCOUNTER — Other Ambulatory Visit: Payer: Self-pay | Admitting: Family Medicine

## 2015-02-14 ENCOUNTER — Other Ambulatory Visit: Payer: Self-pay | Admitting: Family Medicine

## 2015-02-26 ENCOUNTER — Ambulatory Visit (INDEPENDENT_AMBULATORY_CARE_PROVIDER_SITE_OTHER): Payer: Medicare Other | Admitting: Family Medicine

## 2015-02-26 ENCOUNTER — Encounter: Payer: Self-pay | Admitting: Family Medicine

## 2015-02-26 ENCOUNTER — Other Ambulatory Visit: Payer: Self-pay | Admitting: Family Medicine

## 2015-02-26 VITALS — BP 112/76 | HR 84 | Resp 18 | Ht 66.0 in | Wt 171.0 lb

## 2015-02-26 DIAGNOSIS — Z1159 Encounter for screening for other viral diseases: Secondary | ICD-10-CM

## 2015-02-26 DIAGNOSIS — E785 Hyperlipidemia, unspecified: Secondary | ICD-10-CM

## 2015-02-26 DIAGNOSIS — Z1231 Encounter for screening mammogram for malignant neoplasm of breast: Secondary | ICD-10-CM

## 2015-02-26 DIAGNOSIS — Z23 Encounter for immunization: Secondary | ICD-10-CM

## 2015-02-26 DIAGNOSIS — M549 Dorsalgia, unspecified: Secondary | ICD-10-CM

## 2015-02-26 DIAGNOSIS — I10 Essential (primary) hypertension: Secondary | ICD-10-CM | POA: Diagnosis not present

## 2015-02-26 DIAGNOSIS — F418 Other specified anxiety disorders: Secondary | ICD-10-CM

## 2015-02-26 DIAGNOSIS — G43109 Migraine with aura, not intractable, without status migrainosus: Secondary | ICD-10-CM

## 2015-02-26 MED ORDER — KETOROLAC TROMETHAMINE 60 MG/2ML IM SOLN
60.0000 mg | Freq: Once | INTRAMUSCULAR | Status: AC
  Administered 2015-02-26: 60 mg via INTRAMUSCULAR

## 2015-02-26 MED ORDER — DICLOFENAC SODIUM 50 MG PO TBEC: 50.0000 mg | DELAYED_RELEASE_TABLET | Freq: Two times a day (BID) | ORAL | Status: DC

## 2015-02-26 MED ORDER — METHYLPREDNISOLONE ACETATE 80 MG/ML IJ SUSP
80.0000 mg | Freq: Once | INTRAMUSCULAR | Status: AC
  Administered 2015-02-26: 80 mg via INTRAMUSCULAR

## 2015-02-26 MED ORDER — PREDNISONE 5 MG (21) PO TBPK: 5.0000 mg | ORAL_TABLET | ORAL | Status: DC

## 2015-02-26 NOTE — Assessment & Plan Note (Signed)
Uncontrolled.Toradol and depo medrol administered IM in the office , to be followed by a short course of oral prednisone   

## 2015-02-26 NOTE — Patient Instructions (Addendum)
F/u in 4.5 month, call if you need me before   Flu vaccine today  Fasting lipid, cmp ,  Hep C screen within the next 1 week please Injections today for low back pain and 6 day course of prednisone also sent  New is diclofenac for arthritis pain in place of meloxicam since not as effective  Please get mammogram , past due  Very sorry to hear of your Aunt's health, I hope that you will continue to be able to be there for her  Thanks for choosing [facility], we consider it a privelige to serve you.

## 2015-02-26 NOTE — Assessment & Plan Note (Signed)
Controlled, no change in medication DASH diet and commitment to daily physical activity for a minimum of 30 minutes discussed and encouraged, as a part of hypertension management. The importance of attaining a healthy weight is also discussed.  BP/Weight 02/26/2015 10/24/2014 05/29/2014 12/06/2013 09/12/2013 07/11/2013 22/10/3333  Systolic BP 456 256 389 373 428 768 115  Diastolic BP 76 74 72 74 68 80 74  Wt. (Lbs) 171 168.08 169 166.12 173.04 173.04 170  BMI 27.61 27.14 27.29 27.21 28.35 28.35 27.85

## 2015-02-26 NOTE — Progress Notes (Signed)
Subjective:    Patient ID: Sara Woods, female    DOB: [birth year], 70 y.o.   MRN: [id number]  HPI   Sara Woods     MRN: [id number]      DOB: 11-06-1945   HPI Sara Woods is here for follow up and re-evaluation of chronic medical conditions, medication management and review of any available recent lab and radiology data.  Preventive health is updated, specifically  Cancer screening and Immunization.   . The PT denies any adverse reactions to current medications since the last visit.  C/o increased back pain due to recent fall which she feels was due to poor left eye vision, which she needs to have re evaluated C/o recent death in family, though saddened , states she is being strong for other family members  ROS Denies recent fever or chills. Denies sinus pressure, nasal congestion, ear pain or sore throat. Denies chest congestion, productive cough or wheezing. Denies chest pains, palpitations and leg swelling Denies abdominal pain, nausea, vomiting,diarrhea or constipation.   Denies dysuria, frequency, hesitancy or incontinence. Denies uncontrolled  headaches, seizures, numbness, or tingling. Denies uncontrolled  depression, anxiety or insomnia. Denies skin break down or rash.   PE  BP 112/76 mmHg  Pulse 84  Resp 18  Ht 5\' 6"  (1.676 m)  Wt 171 lb (77.565 kg)  BMI 27.61 kg/m2  SpO2 98%  Patient alert and oriented and in no cardiopulmonary distress.Pt in pain  HEENT: No facial asymmetry, EOMI,   oropharynx pink and moist.  Neck supple no JVD, no mass.  Chest: Clear to auscultation bilaterally.  CVS: S1, S2 no murmurs, no S3.Regular rate.  ABD: Soft non tender.   Ext: No edema  MS: decreased  ROM spine, adequate in  shoulders, hips and knees.  Skin: Intact, no ulcerations or rash noted.  Psych: Good eye contact, normal affect. Memory intact not anxious or depressed appearing.  CNS: CN 2-12 intact, power,  normal throughout.no focal deficits  noted.   Assessment & Plan   Essential hypertension Controlled, no change in medication DASH diet and commitment to daily physical activity for a minimum of 30 minutes discussed and encouraged, as a part of hypertension management. The importance of attaining a healthy weight is also discussed.  BP/Weight 02/26/2015 10/24/2014 05/29/2014 12/06/2013 09/12/2013 07/11/2013 89/06/1192  Systolic BP 174 081 448 185 631 497 026  Diastolic BP 76 74 72 74 68 80 74  Wt. (Lbs) 171 168.08 169 166.12 173.04 173.04 170  BMI 27.61 27.14 27.29 27.21 28.35 28.35 27.85        Hyperlipidemia LDL goal <100 Updated lab needed at/ before next visit. Hyperlipidemia:Low fat diet discussed and encouraged.   Lipid Panel  Lab Results  Component Value Date   CHOL 173 10/05/2014   HDL 71 10/05/2014   LDLCALC 87 10/05/2014   TRIG 74 10/05/2014   CHOLHDL 2.4 10/05/2014   Controlled when last checked    Back pain with radiation Uncontrolled.Toradol and depo medrol administered IM in the office , to be followed by a short course of oral prednisone .   Depression with anxiety Pt in again with recent loss i her family. Seems to take these repeated loss "in stride" no decompensation in mental health at visit. Not tearful, suicidal or homicidal, continue current edication  Migraine with aura Less frequent and severe on maintainace topamax and responds to definitve medications , continue same       Review of Systems  Objective:   Physical Exam        Assessment & Plan:

## 2015-02-26 NOTE — Assessment & Plan Note (Signed)
Updated lab needed at/ before next visit. Hyperlipidemia:Low fat diet discussed and encouraged.   Lipid Panel  Lab Results  Component Value Date   CHOL 173 10/05/2014   HDL 71 10/05/2014   LDLCALC 87 10/05/2014   TRIG 74 10/05/2014   CHOLHDL 2.4 10/05/2014   Controlled when last checked

## 2015-03-04 ENCOUNTER — Ambulatory Visit (HOSPITAL_COMMUNITY)
Admission: RE | Admit: 2015-03-04 | Discharge: 2015-03-04 | Disposition: A | Discharge status: Home / Self Care | Payer: Medicare Other | Source: Ambulatory Visit | Attending: Family Medicine | Admitting: Family Medicine

## 2015-03-04 DIAGNOSIS — Z1231 Encounter for screening mammogram for malignant neoplasm of breast: Secondary | ICD-10-CM | POA: Insufficient documentation

## 2015-03-05 ENCOUNTER — Other Ambulatory Visit: Payer: Self-pay | Admitting: Family Medicine

## 2015-03-08 ENCOUNTER — Other Ambulatory Visit: Payer: Self-pay | Admitting: Family Medicine

## 2015-03-08 ENCOUNTER — Other Ambulatory Visit: Payer: Self-pay

## 2015-03-08 DIAGNOSIS — M549 Dorsalgia, unspecified: Secondary | ICD-10-CM

## 2015-03-08 MED ORDER — HYDROCODONE-ACETAMINOPHEN 5-325 MG PO TABS: ORAL_TABLET | ORAL | Status: DC

## 2015-03-13 ENCOUNTER — Other Ambulatory Visit: Payer: Self-pay | Admitting: Family Medicine

## 2015-03-15 DIAGNOSIS — I10 Essential (primary) hypertension: Secondary | ICD-10-CM | POA: Diagnosis not present

## 2015-03-15 DIAGNOSIS — Z1159 Encounter for screening for other viral diseases: Secondary | ICD-10-CM | POA: Diagnosis not present

## 2015-03-15 DIAGNOSIS — E785 Hyperlipidemia, unspecified: Secondary | ICD-10-CM | POA: Diagnosis not present

## 2015-03-16 LAB — COMPREHENSIVE METABOLIC PANEL
ALBUMIN: 3.6 g/dL (ref 3.6–5.1)
ALT: 13 U/L (ref 6–29)
AST: 16 U/L (ref 10–35)
Alkaline Phosphatase: 56 U/L (ref 33–130)
BILIRUBIN TOTAL: 0.3 mg/dL (ref 0.2–1.2)
BUN: 26 mg/dL — ABNORMAL HIGH (ref 7–25)
CALCIUM: 9.9 mg/dL (ref 8.6–10.4)
CHLORIDE: 103 mmol/L (ref 98–110)
CO2: 27 mmol/L (ref 20–31)
CREATININE: 0.95 mg/dL (ref 0.50–0.99)
Glucose, Bld: 84 mg/dL (ref 65–99)
Potassium: 4 mmol/L (ref 3.5–5.3)
SODIUM: 139 mmol/L (ref 135–146)
TOTAL PROTEIN: 6.2 g/dL (ref 6.1–8.1)

## 2015-03-16 LAB — LIPID PANEL
CHOL/HDL RATIO: 3.4 ratio (ref <=5.0)
CHOLESTEROL: 177 mg/dL (ref 125–200)
HDL: 52 mg/dL (ref >=46)
LDL Cholesterol: 101 mg/dL (ref <130)
Triglycerides: 119 mg/dL (ref <150)
VLDL: 24 mg/dL (ref <30)

## 2015-03-16 LAB — HEPATITIS C ANTIBODY: HCV AB: NEGATIVE

## 2015-03-23 ENCOUNTER — Other Ambulatory Visit: Payer: Self-pay | Admitting: Family Medicine

## 2015-03-25 DIAGNOSIS — L570 Actinic keratosis: Secondary | ICD-10-CM | POA: Diagnosis not present

## 2015-03-25 DIAGNOSIS — L708 Other acne: Secondary | ICD-10-CM | POA: Diagnosis not present

## 2015-03-25 DIAGNOSIS — D485 Neoplasm of uncertain behavior of skin: Secondary | ICD-10-CM | POA: Diagnosis not present

## 2015-03-25 DIAGNOSIS — L821 Other seborrheic keratosis: Secondary | ICD-10-CM | POA: Diagnosis not present

## 2015-03-30 NOTE — Assessment & Plan Note (Signed)
Pt in again with recent loss i her family. Seems to take these repeated loss "in stride" no decompensation in mental health at visit. Not tearful, suicidal or homicidal, continue current edication

## 2015-03-30 NOTE — Assessment & Plan Note (Signed)
Less frequent and severe on maintainace topamax and responds to definitve medications , continue same

## 2015-04-01 DIAGNOSIS — Z4802 Encounter for removal of sutures: Secondary | ICD-10-CM | POA: Diagnosis not present

## 2015-04-02 ENCOUNTER — Other Ambulatory Visit: Payer: Self-pay | Admitting: Family Medicine

## 2015-04-08 ENCOUNTER — Other Ambulatory Visit: Payer: Self-pay | Admitting: Family Medicine

## 2015-04-12 ENCOUNTER — Other Ambulatory Visit: Payer: Self-pay

## 2015-04-12 DIAGNOSIS — M549 Dorsalgia, unspecified: Secondary | ICD-10-CM

## 2015-04-12 MED ORDER — HYDROCODONE-ACETAMINOPHEN 5-325 MG PO TABS: ORAL_TABLET | ORAL | Status: DC

## 2015-04-29 ENCOUNTER — Other Ambulatory Visit: Payer: Self-pay | Admitting: Family Medicine

## 2015-05-11 ENCOUNTER — Other Ambulatory Visit: Payer: Self-pay | Admitting: Family Medicine

## 2015-05-17 ENCOUNTER — Other Ambulatory Visit: Payer: Self-pay

## 2015-05-17 DIAGNOSIS — M549 Dorsalgia, unspecified: Secondary | ICD-10-CM

## 2015-05-17 MED ORDER — HYDROCODONE-ACETAMINOPHEN 5-325 MG PO TABS: ORAL_TABLET | ORAL | Status: DC

## 2015-06-07 ENCOUNTER — Other Ambulatory Visit: Payer: Self-pay

## 2015-06-07 DIAGNOSIS — M549 Dorsalgia, unspecified: Secondary | ICD-10-CM

## 2015-06-07 MED ORDER — DICLOFENAC SODIUM 50 MG PO TBEC: 50.0000 mg | DELAYED_RELEASE_TABLET | Freq: Two times a day (BID) | ORAL | Status: DC

## 2015-06-18 ENCOUNTER — Other Ambulatory Visit: Payer: Self-pay

## 2015-06-18 DIAGNOSIS — M549 Dorsalgia, unspecified: Secondary | ICD-10-CM

## 2015-06-18 MED ORDER — HYDROCODONE-ACETAMINOPHEN 5-325 MG PO TABS: ORAL_TABLET | ORAL | Status: DC

## 2015-06-26 ENCOUNTER — Other Ambulatory Visit: Payer: Self-pay | Admitting: Family Medicine

## 2015-07-01 ENCOUNTER — Ambulatory Visit: Payer: Medicare Other | Admitting: Family Medicine

## 2015-07-10 ENCOUNTER — Other Ambulatory Visit: Payer: Self-pay | Admitting: Family Medicine

## 2015-07-18 ENCOUNTER — Ambulatory Visit (INDEPENDENT_AMBULATORY_CARE_PROVIDER_SITE_OTHER): Payer: Medicare Other | Admitting: Family Medicine

## 2015-07-18 ENCOUNTER — Encounter: Payer: Self-pay | Admitting: Family Medicine

## 2015-07-18 VITALS — BP 104/66 | HR 66 | Resp 18 | Ht 66.0 in | Wt 167.0 lb

## 2015-07-18 DIAGNOSIS — R05 Cough: Secondary | ICD-10-CM | POA: Diagnosis not present

## 2015-07-18 DIAGNOSIS — R058 Other specified cough: Secondary | ICD-10-CM | POA: Insufficient documentation

## 2015-07-18 DIAGNOSIS — E785 Hyperlipidemia, unspecified: Secondary | ICD-10-CM | POA: Diagnosis not present

## 2015-07-18 DIAGNOSIS — M549 Dorsalgia, unspecified: Secondary | ICD-10-CM | POA: Diagnosis not present

## 2015-07-18 DIAGNOSIS — I10 Essential (primary) hypertension: Secondary | ICD-10-CM

## 2015-07-18 DIAGNOSIS — J3089 Other allergic rhinitis: Secondary | ICD-10-CM

## 2015-07-18 DIAGNOSIS — F418 Other specified anxiety disorders: Secondary | ICD-10-CM

## 2015-07-18 DIAGNOSIS — G43109 Migraine with aura, not intractable, without status migrainosus: Secondary | ICD-10-CM

## 2015-07-18 MED ORDER — HYDROCHLOROTHIAZIDE 12.5 MG PO CAPS: 12.5000 mg | ORAL_CAPSULE | Freq: Every day | ORAL | Status: DC

## 2015-07-18 MED ORDER — METHYLPREDNISOLONE ACETATE 80 MG/ML IJ SUSP
80.0000 mg | Freq: Once | INTRAMUSCULAR | Status: AC
  Administered 2015-07-18: 80 mg via INTRAMUSCULAR

## 2015-07-18 MED ORDER — KETOROLAC TROMETHAMINE 60 MG/2ML IM SOLN
60.0000 mg | Freq: Once | INTRAMUSCULAR | Status: AC
  Administered 2015-07-18: 60 mg via INTRAMUSCULAR

## 2015-07-18 MED ORDER — PREDNISONE 5 MG (21) PO TBPK: 5.0000 mg | ORAL_TABLET | ORAL | Status: DC

## 2015-07-18 MED ORDER — LISINOPRIL 20 MG PO TABS: ORAL_TABLET | ORAL | Status: DC

## 2015-07-18 MED ORDER — FLUOXETINE HCL 20 MG PO CAPS: 20.0000 mg | ORAL_CAPSULE | Freq: Every day | ORAL | Status: DC

## 2015-07-18 MED ORDER — BENZONATATE 100 MG PO CAPS: 100.0000 mg | ORAL_CAPSULE | Freq: Two times a day (BID) | ORAL | Status: DC

## 2015-07-18 NOTE — Patient Instructions (Addendum)
Annual wellness in 4.5 month, call if you need me before  Nurse BP check in 6 weeks  Blood pressure is low, LOWER DOSE hCTZ , break 25 mg tablet take half daily till done New dose is HCTZ 12.5 mg capsule one daily   Injections today for uncontrolled back pain, also 6 day course of prednisone sent For cough and congestion 1 week course of decongestants, you NEED a CXR if you continue to have cough and congestion, call for this since you report illness for weeks  Hope you feel better soon.  Fasting labs1 week before next visit please

## 2015-07-18 NOTE — Assessment & Plan Note (Signed)
Uncontrolled.Toradol and depo medrol administered IM in the office , to be followed by a short course of oral prednisone and NSAIDS.  

## 2015-07-18 NOTE — Progress Notes (Signed)
   Subjective:    Patient ID: Sara Woods, female    DOB: 1946-04-04, 70 y.o.   MRN: [id number]  HPI 2 weeks h/o cough and chest,congestion, had fever and chills up to 1 week ago, sinuses have been responding to daytime mucinex, no excessive pressure in face, no green drainage from nsotrils. Denies any current ear pain or sore throat. Had been sick for 3 weeks Stool was hard when she was sick but this has resolved C/o increased back pain , wants shots    Review of Systems See HPI re throat. Denies chest congestion, productive cough or wheezing. Denies chest pains, palpitations and leg swelling Denies abdominal pain, nausea, vomiting,diarrhea or constipation.   Denies dysuria, frequency, hesitancy or incontinence.  Denies uncontrolled  headaches, seizures, numbness, or tingling. Denies uncontrolled  depression, anxiety or insomnia. Denies skin break down or rash.         Objective:   Physical Exam BP 104/66 mmHg  Pulse 66  Resp 18  Ht 5\' 6"  (1.676 m)  Wt 167 lb (75.751 kg)  BMI 26.97 kg/m2  SpO2 98% Patient alert and oriented and in no cardiopulmonary distress.  HEENT: No facial asymmetry, EOMI,   oropharynx pink and moist.  Neck supple no JVD, no mass.  Chest: Clear to auscultation bilaterally.  CVS: S1, S2 no murmurs, no S3.Regular rate.  ABD: Soft non tender.   Ext: No edema  MS: decreased  ROM spine, adequate in  shoulders, hips and knees.  Skin: Intact, no ulcerations or rash noted.  Psych: Good eye contact, normal affect. Memory intact not anxious or depressed appearing.  CNS: CN 2-12 intact, power,  normal throughout.no focal deficits noted.        Assessment & Plan:  Essential hypertension Over corrected Reduce hCTYZ to 12.5 mg daily (break 25 mg tab in half till done Nurse re eval in 6 to 8 weeks DASH diet and commitment to daily physical activity for a minimum of 30 minutes discussed and encouraged, as a part of hypertension  management. The importance of attaining a healthy weight is also discussed.  BP/Weight 07/18/2015 02/26/2015 10/24/2014 05/29/2014 12/06/2013 [id number] 0000000  Systolic BP 123456 [id number] A999333 A999333 [id number]  Diastolic BP 66 76 74 72 74 68 80  Wt. (Lbs) 167 171 168.08 169 166.12 173.04 173.04  BMI 26.97 27.61 27.14 27.29 27.21 28.35 28.35        Back pain with radiation Uncontrolled.Toradol and depo medrol administered IM in the office , to be followed by a short course of oral prednisone and NSAIDS.   Migraine with aura Controlled, no change in medication  No recent flare of headaches, topamax effective  Allergic rhinitis Increased symptoms with season change , commit to r  Daily medication for improvement Depo medrol and short course of oral steroids  at visit  Allergic cough Uncontrolled , tessalon perles prescribed for as needed use  Depression with anxiety Controlled, no change in medication   Hyperlipidemia LDL goal <100 Hyperlipidemia:Low fat diet discussed and encouraged.   Lipid Panel  Lab Results  Component Value Date   CHOL 177 03/15/2015   HDL 52 03/15/2015   LDLCALC 101 03/15/2015   TRIG 119 03/15/2015   CHOLHDL 3.4 03/15/2015   Updated lab needed at/ before next visit.

## 2015-07-18 NOTE — Assessment & Plan Note (Signed)
Over corrected Reduce hCTYZ to 12.5 mg daily (break 25 mg tab in half till done Nurse re eval in 6 to 8 weeks DASH diet and commitment to daily physical activity for a minimum of 30 minutes discussed and encouraged, as a part of hypertension management. The importance of attaining a healthy weight is also discussed.  BP/Weight 07/18/2015 02/26/2015 10/24/2014 05/29/2014 12/06/2013 [id number] 0000000  Systolic BP 123456 [id number] A999333 A999333 [id number]  Diastolic BP 66 76 74 72 74 68 80  Wt. (Lbs) 167 171 168.08 169 166.12 173.04 173.04  BMI 26.97 27.61 27.14 27.29 27.21 28.35 28.35

## 2015-08-04 ENCOUNTER — Encounter: Payer: Self-pay | Admitting: Family Medicine

## 2015-08-04 NOTE — Assessment & Plan Note (Signed)
Controlled, no change in medication  

## 2015-08-04 NOTE — Assessment & Plan Note (Signed)
Controlled, no change in medication  No recent flare of headaches, topamax effective

## 2015-08-04 NOTE — Assessment & Plan Note (Signed)
Hyperlipidemia:Low fat diet discussed and encouraged.   Lipid Panel  Lab Results  Component Value Date   CHOL 177 03/15/2015   HDL 52 03/15/2015   LDLCALC 101 03/15/2015   TRIG 119 03/15/2015   CHOLHDL 3.4 03/15/2015   Updated lab needed at/ before next visit.

## 2015-08-04 NOTE — Assessment & Plan Note (Signed)
Increased symptoms with season change , commit to r  Daily medication for improvement Depo medrol and short course of oral steroids  at visit

## 2015-08-04 NOTE — Assessment & Plan Note (Signed)
Uncontrolled , tessalon perles prescribed for as needed use

## 2015-08-10 ENCOUNTER — Other Ambulatory Visit: Payer: Self-pay | Admitting: Family Medicine

## 2015-09-02 ENCOUNTER — Other Ambulatory Visit: Payer: Self-pay

## 2015-09-06 ENCOUNTER — Other Ambulatory Visit: Payer: Self-pay | Admitting: Family Medicine

## 2015-09-20 ENCOUNTER — Other Ambulatory Visit: Payer: Self-pay

## 2015-09-20 DIAGNOSIS — M549 Dorsalgia, unspecified: Secondary | ICD-10-CM

## 2015-09-20 MED ORDER — HYDROCODONE-ACETAMINOPHEN 5-325 MG PO TABS: ORAL_TABLET | ORAL | Status: DC

## 2015-10-08 ENCOUNTER — Other Ambulatory Visit: Payer: Self-pay | Admitting: Family Medicine

## 2015-10-25 ENCOUNTER — Other Ambulatory Visit: Payer: Self-pay

## 2015-10-25 DIAGNOSIS — M549 Dorsalgia, unspecified: Secondary | ICD-10-CM

## 2015-10-25 MED ORDER — HYDROCODONE-ACETAMINOPHEN 5-325 MG PO TABS: ORAL_TABLET | ORAL | Status: DC

## 2015-11-08 ENCOUNTER — Other Ambulatory Visit: Payer: Self-pay | Admitting: Family Medicine

## 2015-11-26 ENCOUNTER — Ambulatory Visit: Payer: Medicare Other | Admitting: Family Medicine

## 2015-11-30 ENCOUNTER — Other Ambulatory Visit: Payer: Self-pay | Admitting: Family Medicine

## 2015-12-09 ENCOUNTER — Other Ambulatory Visit: Payer: Self-pay | Admitting: Family Medicine

## 2015-12-13 DIAGNOSIS — E785 Hyperlipidemia, unspecified: Secondary | ICD-10-CM | POA: Diagnosis not present

## 2015-12-13 DIAGNOSIS — I10 Essential (primary) hypertension: Secondary | ICD-10-CM | POA: Diagnosis not present

## 2015-12-13 LAB — COMPREHENSIVE METABOLIC PANEL
ALK PHOS: 56 U/L (ref 33–130)
ALT: 14 U/L (ref 6–29)
AST: 17 U/L (ref 10–35)
Albumin: 4 g/dL (ref 3.6–5.1)
BUN: 38 mg/dL — ABNORMAL HIGH (ref 7–25)
CALCIUM: 9.4 mg/dL (ref 8.6–10.4)
CO2: 27 mmol/L (ref 20–31)
Chloride: 107 mmol/L (ref 98–110)
Creat: 1.4 mg/dL — ABNORMAL HIGH (ref 0.60–0.93)
Glucose, Bld: 84 mg/dL (ref 65–99)
POTASSIUM: 4.1 mmol/L (ref 3.5–5.3)
Sodium: 142 mmol/L (ref 135–146)
TOTAL PROTEIN: 6.4 g/dL (ref 6.1–8.1)
Total Bilirubin: 0.3 mg/dL (ref 0.2–1.2)

## 2015-12-13 LAB — LIPID PANEL
CHOLESTEROL: 172 mg/dL (ref 125–200)
HDL: 56 mg/dL (ref >=46)
LDL Cholesterol: 92 mg/dL (ref <130)
TRIGLYCERIDES: 120 mg/dL (ref <150)
Total CHOL/HDL Ratio: 3.1 Ratio (ref <=5.0)
VLDL: 24 mg/dL (ref <30)

## 2015-12-13 LAB — CBC
HEMATOCRIT: 37.4 % (ref 35.0–45.0)
Hemoglobin: 11.7 g/dL (ref 11.7–15.5)
MCH: 29.9 pg (ref 27.0–33.0)
MCHC: 31.3 g/dL — ABNORMAL LOW (ref 32.0–36.0)
MCV: 95.7 fL (ref 80.0–100.0)
MPV: 10.8 fL (ref 7.5–12.5)
Platelets: 309 10*3/uL (ref 140–400)
RBC: 3.91 MIL/uL (ref 3.80–5.10)
RDW: 13.6 % (ref 11.0–15.0)
WBC: 5.2 10*3/uL (ref 3.8–10.8)

## 2015-12-13 LAB — TSH: TSH: 1.73 mIU/L

## 2015-12-19 ENCOUNTER — Ambulatory Visit (INDEPENDENT_AMBULATORY_CARE_PROVIDER_SITE_OTHER): Payer: Medicare Other | Admitting: Family Medicine

## 2015-12-19 ENCOUNTER — Encounter: Payer: Self-pay | Admitting: Family Medicine

## 2015-12-19 VITALS — BP 130/72 | HR 76 | Resp 18 | Ht 66.0 in | Wt 173.0 lb

## 2015-12-19 DIAGNOSIS — M549 Dorsalgia, unspecified: Secondary | ICD-10-CM | POA: Diagnosis not present

## 2015-12-19 DIAGNOSIS — Z0000 Encounter for general adult medical examination without abnormal findings: Secondary | ICD-10-CM

## 2015-12-19 MED ORDER — KETOROLAC TROMETHAMINE 60 MG/2ML IM SOLN
60.0000 mg | Freq: Once | INTRAMUSCULAR | Status: AC
  Administered 2015-12-19: 60 mg via INTRAMUSCULAR

## 2015-12-19 MED ORDER — METHYLPREDNISOLONE ACETATE 80 MG/ML IJ SUSP
80.0000 mg | Freq: Once | INTRAMUSCULAR | Status: AC
  Administered 2015-12-19: 80 mg via INTRAMUSCULAR

## 2015-12-19 NOTE — Progress Notes (Signed)
Preventive Screening-Counseling & Management   Patient present here today for a Medicare annual wellness visit. 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 No  Over the past two weeks, have you felt little interest or pleasure in doing things? No  Have you lost interest or pleasure in daily life? No  Do you often feel hopeless? No  Do you cry easily over simple problems? No   Activities of Daily Living  In your present state of health, do you have any difficulty performing the following activities?  Driving?: No Managing money?: No Feeding yourself?:No Getting from bed to chair?:No Climbing a flight of stairs?:yes at times due to pain Preparing food and eating?:No Bathing or showering?:No Getting dressed?:No Getting to the toilet?:No Using the toilet?:No Moving around from place to place?: No  Fall Risk Assessment In the past year have you fallen or had a near fall?:No Are you currently taking any medications that make you dizziness?:No   Hearing Difficulties: No Do you often ask people to speak up or repeat themselves?:No Do you experience ringing or noises in your ears?:No Do you have difficulty understanding soft or whispered voices?:No  Cognitive Testing  Alert? Yes Normal Appearance?Yes   Oriented to person? Yes Place? Yes  Time? Yes  Displays appropriate judgment?Yes  Can read the correct time from a watch face? yes Are you having problems remembering things?No  Advanced Directives have been discussed with the patient?Yes and brochure/forms provided    List the Names of Other Physician/Practitioners you currently use: careteams up to date    Indicate any recent Medical Services you may have received from other than [facility] providers in the past year (date may be approximate).     Medicare Attestation  I have personally reviewed:  The patient's medical and social history  Their use of alcohol, tobacco or illicit drugs  Their current medications and supplements  The patient's functional ability including ADLs,fall risks, home safety risks, cognitive, and hearing and visual impairment  Diet and physical activities  Evidence for depression or mood disorders  The patient's weight, height, BMI, and visual acuity have been recorded in the chart. I have made referrals, counseling, and provided education to the patient based on review of the above and I have provided the patient with a written personalized care plan for preventive services.    Physical Exam BP 130/72   Pulse 76   Resp 18   Ht 5\' 6"  (1.676 m)   Wt 173 lb (78.5 kg)   SpO2 99%   BMI 27.92 kg/m   MS: Decreased ROM lumbar spine  Assessment & Plan:  Back pain with radiation Uncontrolled.Toradol and depo medrol administered IM in the office .  Medicare annual wellness visit, subsequent Annual exam as documented. Counseling done  re healthy lifestyle involving commitment to 150 minutes exercise per week, heart healthy diet, and attaining healthy weight.The importance of adequate sleep also discussed. Regular seat belt use and home safety, is also discussed. Changes in health habits are decided on by the patient with goals and time frames  set for achieving them. Immunization and cancer screening needs  are specifically addressed at this visit.

## 2015-12-19 NOTE — Patient Instructions (Signed)
F/u with rectal in 4 month, call if you need me sooner  Injections today for back pain  Excellent labs, bUT dRINK 64 ounces water every day please even when getting blood work  Thank you  for choosing [facility]. We consider it a privelige to serve you.  Delivering excellent health care in a caring and  compassionate way is our goal.  Partnering with you,  so that together we can achieve this goal is our strategy.

## 2015-12-22 NOTE — Assessment & Plan Note (Addendum)
Uncontrolled.Toradol and depo medrol administered IM in the office . 

## 2015-12-22 NOTE — Assessment & Plan Note (Signed)

## 2015-12-25 ENCOUNTER — Other Ambulatory Visit: Payer: Self-pay | Admitting: Family Medicine

## 2016-01-08 ENCOUNTER — Other Ambulatory Visit: Payer: Self-pay | Admitting: Family Medicine

## 2016-01-10 ENCOUNTER — Other Ambulatory Visit: Payer: Self-pay

## 2016-01-10 DIAGNOSIS — M549 Dorsalgia, unspecified: Secondary | ICD-10-CM

## 2016-01-10 MED ORDER — HYDROCODONE-ACETAMINOPHEN 5-325 MG PO TABS: ORAL_TABLET | ORAL | 0 refills | Status: DC

## 2016-01-15 ENCOUNTER — Other Ambulatory Visit: Payer: Self-pay | Admitting: Family Medicine

## 2016-02-21 ENCOUNTER — Other Ambulatory Visit: Payer: Self-pay

## 2016-02-21 DIAGNOSIS — M549 Dorsalgia, unspecified: Secondary | ICD-10-CM

## 2016-02-21 MED ORDER — HYDROCODONE-ACETAMINOPHEN 5-325 MG PO TABS: ORAL_TABLET | ORAL | 0 refills | Status: DC

## 2016-03-20 ENCOUNTER — Other Ambulatory Visit: Payer: Self-pay

## 2016-03-20 DIAGNOSIS — M549 Dorsalgia, unspecified: Secondary | ICD-10-CM

## 2016-03-20 MED ORDER — HYDROCODONE-ACETAMINOPHEN 5-325 MG PO TABS: ORAL_TABLET | ORAL | 0 refills | Status: DC

## 2016-03-23 ENCOUNTER — Ambulatory Visit (INDEPENDENT_AMBULATORY_CARE_PROVIDER_SITE_OTHER): Payer: Medicare Other

## 2016-03-23 DIAGNOSIS — Z23 Encounter for immunization: Secondary | ICD-10-CM

## 2016-03-24 DIAGNOSIS — L821 Other seborrheic keratosis: Secondary | ICD-10-CM | POA: Diagnosis not present

## 2016-03-24 DIAGNOSIS — L570 Actinic keratosis: Secondary | ICD-10-CM | POA: Diagnosis not present

## 2016-03-24 DIAGNOSIS — D1800 Hemangioma unspecified site: Secondary | ICD-10-CM | POA: Diagnosis not present

## 2016-03-30 ENCOUNTER — Other Ambulatory Visit: Payer: Self-pay | Admitting: Family Medicine

## 2016-04-06 ENCOUNTER — Ambulatory Visit (INDEPENDENT_AMBULATORY_CARE_PROVIDER_SITE_OTHER): Payer: Medicare Other

## 2016-04-06 ENCOUNTER — Telehealth: Payer: Self-pay

## 2016-04-06 DIAGNOSIS — M549 Dorsalgia, unspecified: Secondary | ICD-10-CM

## 2016-04-06 MED ORDER — KETOROLAC TROMETHAMINE 60 MG/2ML IM SOLN
60.0000 mg | Freq: Once | INTRAMUSCULAR | Status: AC
  Administered 2016-04-06: 60 mg via INTRAMUSCULAR

## 2016-04-06 MED ORDER — METHYLPREDNISOLONE ACETATE 80 MG/ML IJ SUSP
80.0000 mg | Freq: Once | INTRAMUSCULAR | Status: AC
  Administered 2016-04-06: 80 mg via INTRAMUSCULAR

## 2016-04-06 NOTE — Telephone Encounter (Signed)
oK toradol 60 mg and depo medrol 80 mg iM for back pain

## 2016-04-06 NOTE — Progress Notes (Signed)
Patient in for injections due to lower back pain.  Pain due to prolonged standing.  Injections given as ordered.  Will call office if she continues to have pain.

## 2016-04-06 NOTE — Telephone Encounter (Signed)
Patient aware and will come in between 1:30-4

## 2016-04-08 ENCOUNTER — Other Ambulatory Visit: Payer: Self-pay | Admitting: Family Medicine

## 2016-04-09 ENCOUNTER — Other Ambulatory Visit: Payer: Self-pay | Admitting: Family Medicine

## 2016-04-16 ENCOUNTER — Other Ambulatory Visit: Payer: Self-pay

## 2016-04-16 DIAGNOSIS — M549 Dorsalgia, unspecified: Secondary | ICD-10-CM

## 2016-04-16 MED ORDER — HYDROCODONE-ACETAMINOPHEN 5-325 MG PO TABS: ORAL_TABLET | ORAL | 0 refills | Status: DC

## 2016-04-21 ENCOUNTER — Ambulatory Visit: Payer: Medicare Other | Admitting: Family Medicine

## 2016-05-02 ENCOUNTER — Other Ambulatory Visit: Payer: Self-pay | Admitting: Family Medicine

## 2016-05-09 ENCOUNTER — Other Ambulatory Visit: Payer: Self-pay | Admitting: Family Medicine

## 2016-06-02 ENCOUNTER — Ambulatory Visit: Payer: Medicare Other | Admitting: Family Medicine

## 2016-06-09 ENCOUNTER — Other Ambulatory Visit: Payer: Self-pay | Admitting: Family Medicine

## 2016-06-22 ENCOUNTER — Ambulatory Visit (INDEPENDENT_AMBULATORY_CARE_PROVIDER_SITE_OTHER): Payer: Medicare Other | Admitting: Family Medicine

## 2016-06-22 ENCOUNTER — Encounter: Payer: Self-pay | Admitting: Family Medicine

## 2016-06-22 VITALS — BP 128/84 | HR 84 | Resp 15 | Ht 66.0 in | Wt 178.0 lb

## 2016-06-22 DIAGNOSIS — M8580 Other specified disorders of bone density and structure, unspecified site: Secondary | ICD-10-CM

## 2016-06-22 DIAGNOSIS — G8929 Other chronic pain: Secondary | ICD-10-CM

## 2016-06-22 DIAGNOSIS — M859 Disorder of bone density and structure, unspecified: Secondary | ICD-10-CM | POA: Diagnosis not present

## 2016-06-22 DIAGNOSIS — I10 Essential (primary) hypertension: Secondary | ICD-10-CM | POA: Diagnosis not present

## 2016-06-22 DIAGNOSIS — B9689 Other specified bacterial agents as the cause of diseases classified elsewhere: Secondary | ICD-10-CM

## 2016-06-22 DIAGNOSIS — M549 Dorsalgia, unspecified: Secondary | ICD-10-CM | POA: Diagnosis not present

## 2016-06-22 DIAGNOSIS — J019 Acute sinusitis, unspecified: Secondary | ICD-10-CM

## 2016-06-22 DIAGNOSIS — J208 Acute bronchitis due to other specified organisms: Secondary | ICD-10-CM

## 2016-06-22 MED ORDER — METHYLPREDNISOLONE ACETATE 80 MG/ML IJ SUSP
80.0000 mg | Freq: Once | INTRAMUSCULAR | Status: AC
  Administered 2016-06-22: 80 mg via INTRAMUSCULAR

## 2016-06-22 MED ORDER — HYDROCODONE-ACETAMINOPHEN 5-325 MG PO TABS: ORAL_TABLET | ORAL | 0 refills | Status: DC

## 2016-06-22 MED ORDER — PREDNISONE 5 MG (21) PO TBPK: 5.0000 mg | ORAL_TABLET | ORAL | 0 refills | Status: DC

## 2016-06-22 MED ORDER — LEVOFLOXACIN 500 MG PO TABS: 500.0000 mg | ORAL_TABLET | Freq: Every day | ORAL | 0 refills | Status: DC

## 2016-06-22 MED ORDER — BENZONATATE 100 MG PO CAPS: 100.0000 mg | ORAL_CAPSULE | Freq: Two times a day (BID) | ORAL | 0 refills | Status: DC | PRN

## 2016-06-22 MED ORDER — KETOROLAC TROMETHAMINE 60 MG/2ML IM SOLN
60.0000 mg | Freq: Once | INTRAMUSCULAR | Status: AC
  Administered 2016-06-22: 60 mg via INTRAMUSCULAR

## 2016-06-22 NOTE — Patient Instructions (Addendum)
F/u in 3 month, call if you need me before  Please start tylenol ES 500 mg tablet one at bedtime  Hydrocodone tablets  limited to 4 per week  Iinjeections today for back pain and prednisone dose pack sent in  You are treated for sinusitis and bronchitis , medication is prescribed  Fasting lipid, cmp and EGFR vit D level this week please  Thank you  for choosing [facility]. We consider it a privelige to serve you.  Delivering excellent health care in a caring and  compassionate way is our goal.  Partnering with you,  so that together we can achieve this goal is our strategy.

## 2016-06-24 ENCOUNTER — Encounter: Payer: Self-pay | Admitting: Family Medicine

## 2016-06-24 NOTE — Assessment & Plan Note (Signed)
Levaquin and saline flushes

## 2016-06-24 NOTE — Assessment & Plan Note (Signed)
Controlled, no change in medication DASH diet and commitment to daily physical activity for a minimum of 30 minutes discussed and encouraged, as a part of hypertension management. The importance of attaining a healthy weight is also discussed.  BP/Weight 06/22/2016 12/19/2015 07/18/2015 02/26/2015 10/24/2014 05/29/2014 [id number]  Systolic BP 0000000 [id number] 123456 [id number] [id number] [id number]  Diastolic BP 84 72 66 76 74 72 74  Wt. (Lbs) 178 173 167 171 168.08 169 166.12  BMI 28.73 27.92 26.97 27.61 27.14 27.29 27.21

## 2016-06-24 NOTE — Assessment & Plan Note (Signed)
Uncontrolled.Toradol and depo medrol administered IM in the office , to be followed by a short course of oral prednisone   

## 2016-06-24 NOTE — Progress Notes (Signed)
   Sara Woods     MRN: [id number]      DOB: 04/28/1946   HPI Ms. Sitzes is here for follow up and re-evaluation of chronic medical conditions, medication management and review of any available recent lab and radiology data.  Preventive health is updated, specifically  Cancer screening and Immunization.   Chronic pain management is reviewed in terms of adequacy, and pain policy reviewed and signed. C/o increased lower back and lower extremity pain rated at an 8 ove past week, states  Benefits form injections in office and requests same. Denies lower extremity weakness or sensory loss. Denies incontinence of stool or urine C/o 2 week h/o head and chest congestion, thick yellow green nasal drainage and sputum with facil pressure and intermittent chills    ROS  Denies chest pains, palpitations and leg swelling Denies abdominal pain, nausea, vomiting,diarrhea or constipation.   Denies dysuria, frequency, hesitancy or incontinence.  Denies headaches, seizures, numbness, or tingling. Denies depression, anxiety or insomnia. Denies skin break down or rash.   PE  BP 128/84   Pulse 84   Resp 15   Ht 5\' 6"  (1.676 m)   Wt 178 lb (80.7 kg)   BMI 28.73 kg/m   Patient alert and oriented and in no cardiopulmonary distress.  HEENT: No facial asymmetry, EOMI,   oropharynx pink and moist.  Neck supple no JVD, no mass.Maxillary sinus tenderness and frontal sinus tenderness. TM clear  Chest: decreased though adequate air entry, scattered crackles , no wheezes  CVS: S1, S2 no murmurs, no S3.Regular rate.  ABD: Soft non tender.   Ext: No edema  MS: decreased  ROM lumbar  spine, normal in shoulders, hips and knees.  Skin: Intact, no ulcerations or rash noted.  Psych: Good eye contact, normal affect. Memory intact not anxious or depressed appearing.  CNS: CN 2-12 intact, power,  normal throughout.no focal deficits noted.   Assessment & Plan  Back pain with  radiation Uncontrolled.Toradol and depo medrol administered IM in the office , to be followed by a short course of oral prednisone   Acute bronchitis Levaquin prescribed  Acute bacterial sinusitis Levaquin and saline flushes  Encounter for chronic pain management Pain management reviewed, established need for 4 hydrocodone weekly, addition of daily tylenol to be started, and more regular stretching exercise Pain contract reviewed and signed Prescription provided to last for 12 weeks  Essential hypertension Controlled, no change in medication DASH diet and commitment to daily physical activity for a minimum of 30 minutes discussed and encouraged, as a part of hypertension management. The importance of attaining a healthy weight is also discussed.  BP/Weight 06/22/2016 12/19/2015 07/18/2015 02/26/2015 10/24/2014 05/29/2014 [id number]  Systolic BP 0000000 [id number] 123456 [id number] [id number] [id number]  Diastolic BP 84 72 66 76 74 72 74  Wt. (Lbs) 178 173 167 171 168.08 169 166.12  BMI 28.73 27.92 26.97 27.61 27.14 27.29 27.21

## 2016-06-24 NOTE — Assessment & Plan Note (Signed)
Pain management reviewed, established need for 4 hydrocodone weekly, addition of daily tylenol to be started, and more regular stretching exercise Pain contract reviewed and signed Prescription provided to last for 12 weeks

## 2016-06-24 NOTE — Assessment & Plan Note (Signed)
Levaquin prescribed 

## 2016-07-02 DIAGNOSIS — I10 Essential (primary) hypertension: Secondary | ICD-10-CM | POA: Diagnosis not present

## 2016-07-02 DIAGNOSIS — M859 Disorder of bone density and structure, unspecified: Secondary | ICD-10-CM | POA: Diagnosis not present

## 2016-07-02 DIAGNOSIS — M8580 Other specified disorders of bone density and structure, unspecified site: Secondary | ICD-10-CM | POA: Diagnosis not present

## 2016-07-02 LAB — COMPLETE METABOLIC PANEL WITH GFR
ALBUMIN: 4 g/dL (ref 3.6–5.1)
ALK PHOS: 61 U/L (ref 33–130)
ALT: 15 U/L (ref 6–29)
AST: 17 U/L (ref 10–35)
BUN: 19 mg/dL (ref 7–25)
CO2: 25 mmol/L (ref 20–31)
Calcium: 9.6 mg/dL (ref 8.6–10.4)
Chloride: 111 mmol/L — ABNORMAL HIGH (ref 98–110)
Creat: 0.87 mg/dL (ref 0.60–0.93)
GFR, Est African American: 78 mL/min (ref >=60)
GFR, Est Non African American: 67 mL/min (ref >=60)
GLUCOSE: 83 mg/dL (ref 65–99)
POTASSIUM: 3.7 mmol/L (ref 3.5–5.3)
SODIUM: 145 mmol/L (ref 135–146)
TOTAL PROTEIN: 6.5 g/dL (ref 6.1–8.1)
Total Bilirubin: 0.5 mg/dL (ref 0.2–1.2)

## 2016-07-02 LAB — LIPID PANEL
Cholesterol: 164 mg/dL (ref <200)
HDL: 62 mg/dL (ref >50)
LDL Cholesterol: 74 mg/dL (ref <100)
Total CHOL/HDL Ratio: 2.6 Ratio (ref <5.0)
Triglycerides: 142 mg/dL (ref <150)
VLDL: 28 mg/dL (ref <30)

## 2016-07-03 ENCOUNTER — Encounter: Payer: Self-pay | Admitting: Family Medicine

## 2016-07-03 LAB — VITAMIN D 25 HYDROXY (VIT D DEFICIENCY, FRACTURES): Vit D, 25-Hydroxy: 27 ng/mL — ABNORMAL LOW (ref 30–100)

## 2016-07-07 ENCOUNTER — Ambulatory Visit: Payer: Medicare Other | Admitting: Family Medicine

## 2016-09-10 ENCOUNTER — Telehealth: Payer: Self-pay | Admitting: Family Medicine

## 2016-09-10 ENCOUNTER — Ambulatory Visit (INDEPENDENT_AMBULATORY_CARE_PROVIDER_SITE_OTHER): Payer: Medicare Other | Admitting: Family Medicine

## 2016-09-10 ENCOUNTER — Other Ambulatory Visit: Payer: Self-pay

## 2016-09-10 ENCOUNTER — Encounter: Payer: Self-pay | Admitting: Family Medicine

## 2016-09-10 VITALS — BP 128/84 | HR 84 | Resp 14 | Ht 66.0 in | Wt 176.0 lb

## 2016-09-10 DIAGNOSIS — I10 Essential (primary) hypertension: Secondary | ICD-10-CM

## 2016-09-10 DIAGNOSIS — E785 Hyperlipidemia, unspecified: Secondary | ICD-10-CM | POA: Diagnosis not present

## 2016-09-10 DIAGNOSIS — R42 Dizziness and giddiness: Secondary | ICD-10-CM | POA: Diagnosis not present

## 2016-09-10 DIAGNOSIS — G43109 Migraine with aura, not intractable, without status migrainosus: Secondary | ICD-10-CM

## 2016-09-10 DIAGNOSIS — M549 Dorsalgia, unspecified: Secondary | ICD-10-CM | POA: Diagnosis not present

## 2016-09-10 DIAGNOSIS — G8929 Other chronic pain: Secondary | ICD-10-CM | POA: Diagnosis not present

## 2016-09-10 MED ORDER — KETOROLAC TROMETHAMINE 60 MG/2ML IM SOLN
60.0000 mg | Freq: Once | INTRAMUSCULAR | Status: AC
  Administered 2016-09-10: 60 mg via INTRAMUSCULAR

## 2016-09-10 MED ORDER — PREDNISONE 5 MG PO TABS: 5.0000 mg | ORAL_TABLET | Freq: Two times a day (BID) | ORAL | 0 refills | Status: AC

## 2016-09-10 MED ORDER — RANITIDINE HCL 300 MG PO TABS: 300.0000 mg | ORAL_TABLET | Freq: Every day | ORAL | 4 refills | Status: DC

## 2016-09-10 MED ORDER — GABAPENTIN 300 MG PO CAPS: 300.0000 mg | ORAL_CAPSULE | Freq: Three times a day (TID) | ORAL | 3 refills | Status: DC

## 2016-09-10 MED ORDER — HYDROCODONE-ACETAMINOPHEN 5-325 MG PO TABS: ORAL_TABLET | ORAL | 0 refills | Status: DC

## 2016-09-10 MED ORDER — METHYLPREDNISOLONE ACETATE 80 MG/ML IJ SUSP
80.0000 mg | Freq: Once | INTRAMUSCULAR | Status: AC
  Administered 2016-09-10: 80 mg via INTRAMUSCULAR

## 2016-09-10 MED ORDER — HYDROCODONE-ACETAMINOPHEN 5-325 MG PO TABS
ORAL_TABLET | ORAL | 0 refills | Status: DC
Start: 2016-09-10 — End: 2016-12-28

## 2016-09-10 MED ORDER — GABAPENTIN 300 MG PO CAPS: ORAL_CAPSULE | ORAL | 4 refills | Status: DC

## 2016-09-10 NOTE — Assessment & Plan Note (Signed)
Controlled, no change in medication  

## 2016-09-10 NOTE — Telephone Encounter (Signed)
Pharmacy calling bc they've received instructions for gabapentin. One thru the fax and one thru escribe. Please advise which to follow.

## 2016-09-10 NOTE — Assessment & Plan Note (Signed)
Chronic pain management reviewed. Reported that she was "doing well'.Then did state she has been on the road a lot as her mother in law has been [place] and has heart valve replacement surgery anticipated in the next 12 days Narcotic registry review clearly shows use of on avg 3 tablets per week. When script was given pt became upset and demanded that I review her pain management as she was convinced she was prescribed daily hydrocodone. I explained his is not so. She is to continue use of at most 3 tablets per week. Increase in gabapentin 300 mg to up to 2 at bedtime, and add daily zantac 300 mg, as sh relies on diclofenac. States she has been taking excess BC powder, I have advised against this due to high risk of stomach ulcer. Stated she "did not mean to cause trouble" just was 'confused" about her medication and wanted to understand

## 2016-09-10 NOTE — Assessment & Plan Note (Signed)
Controlled, no change in medication DASH diet and commitment to daily physical activity for a minimum of 30 minutes discussed and encouraged, as a part of hypertension management. The importance of attaining a healthy weight is also discussed.  BP/Weight 09/10/2016 06/22/2016 12/19/2015 07/18/2015 02/26/2015 10/24/2014 0/72/2575  Systolic BP 051 833 582 518 984 210 312  Diastolic BP 84 84 72 66 76 74 72  Wt. (Lbs) 176 178 173 167 171 168.08 169  BMI 28.41 28.73 27.92 26.97 27.61 27.14 27.29

## 2016-09-10 NOTE — Assessment & Plan Note (Signed)
Uncontrolled.Toradol and depo medrol administered IM in the office , to be followed by a short course of oral prednisone. Zantac started on daily basis as she takes diclofenac daily

## 2016-09-10 NOTE — Progress Notes (Signed)
Sara Woods     MRN: [id number]      DOB: [birth year]   HPI Sara Woods is here for follow up and re-evaluation of chronic medical conditions, medication management and review of any available recent lab and radiology data.  Preventive health is updated, specifically  Cancer screening and Immunization.   The PT denies any adverse reactions to current medications since the last visit.  Requests injections for back pain as she has been up and down the road more in past several weeks taking her mother in law to the hospital and clarification and increase in gabapentin dose as well as addition of zantac daily needed in chronic management initiated ROS Denies recent fever or chills. Denies sinus pressure, nasal congestion, ear pain or sore throat. Denies chest congestion, productive cough or wheezing. Denies chest pains, palpitations and leg swelling Denies abdominal pain, nausea, vomiting,diarrhea or constipation.   Denies dysuria, frequency, hesitancy or incontinence. . Denies headaches, seizures, numbness, or tingling. Denies depression, anxiety or insomnia. Denies skin break down or rash.   PE  BP 128/84   Pulse 84   Resp 14   Ht 5\' 6"  (1.676 m)   Wt 176 lb (79.8 kg)   SpO2 97%   BMI 28.41 kg/m   Patient alert and oriented and in no cardiopulmonary distress.  HEENT: No facial asymmetry, EOMI,   oropharynx pink and moist.  Neck supple no JVD, no mass.  Chest: Clear to auscultation bilaterally.  CVS: S1, S2 no murmurs, no S3.Regular rate.  ABD: Soft non tender.   Ext: No edema  MS: Decreased  ROM spine, normal in shoulders, hips and knees.  Skin: Intact, no ulcerations or rash noted.  Psych: Good eye contact, normal affect. Memory intact not anxious or depressed appearing.  CNS: CN 2-12 intact, power,  normal throughout.no focal deficits noted.   Assessment & Plan  Essential hypertension Controlled, no change in medication DASH diet and commitment to daily  physical activity for a minimum of 30 minutes discussed and encouraged, as a part of hypertension management. The importance of attaining a healthy weight is also discussed.  BP/Weight 09/10/2016 06/22/2016 12/19/2015 07/18/2015 02/26/2015 10/24/2014 11/08/1599  Systolic BP 093 235 573 220 254 270 623  Diastolic BP 84 84 72 66 76 74 72  Wt. (Lbs) 176 178 173 167 171 168.08 169  BMI 28.41 28.73 27.92 26.97 27.61 27.14 27.29       Back pain with radiation Uncontrolled.Toradol and depo medrol administered IM in the office , to be followed by a short course of oral prednisone. Zantac started on daily basis as she takes diclofenac daily   Encounter for chronic pain management Chronic pain management reviewed. Reported that she was "doing well'.Then did state she has been on the road a lot as her mother in law has been il and has heart valve replacement surgery anticipated in the next 12 days Narcotic registry review clearly shows use of on avg 3 tablets per week. When script was given pt became upset and demanded that I review her pain management as she was convinced she was prescribed daily hydrocodone. I explained his is not so. She is to continue use of at most 3 tablets per week. Increase in gabapentin 300 mg to up to 2 at bedtime, and add daily zantac 300 mg, as sh relies on diclofenac. States she has been taking excess BC powder, I have advised against this due to high risk of stomach ulcer. Stated she "  did not mean to cause trouble" just was 'confused" about her medication and wanted to understand  Migraine with aura Controlled, no change in medication topamax dose varies from one to two daily tends to be at higher dose in Spring   Vertigo Controlled, no change in medication   Hyperlipidemia LDL goal <100 Hyperlipidemia:Low fat diet discussed and encouraged.   Lipid Panel  Lab Results  Component Value Date   CHOL 164 07/02/2016   HDL 62 07/02/2016   LDLCALC 74 07/02/2016   TRIG  142 07/02/2016   CHOLHDL 2.6 07/02/2016  Controlled, no change in medication     ]

## 2016-09-10 NOTE — Assessment & Plan Note (Signed)
Controlled, no change in medication topamax dose varies from one to two daily tends to be at higher dose in Spring

## 2016-09-10 NOTE — Assessment & Plan Note (Signed)
Hyperlipidemia:Low fat diet discussed and encouraged.   Lipid Panel  Lab Results  Component Value Date   CHOL 164 07/02/2016   HDL 62 07/02/2016   LDLCALC 74 07/02/2016   TRIG 142 07/02/2016   CHOLHDL 2.6 07/02/2016  Controlled, no change in medication    

## 2016-09-10 NOTE — Telephone Encounter (Signed)
Called and verified correct script with [facility]

## 2016-09-10 NOTE — Patient Instructions (Addendum)
f/u with rectal week of August 20, call if you need me sooner  Medications as reviewed, you are prescribed medication to last for 4 months, use no more than  3 pain tablets per week  Five day course of prednisone is sent to help with current pain flare.  Gabapentin dose is increased so that you can take up to two capsules on the nights that you have increased pain.  PLEASE do not use BC powder for pain as this can cause stomach ulcers, you are already taking an anti inflammtory pain medication regularly I am also sending in daily zantac to protect your stomach Injections today for pain  Prayers for your Mother in Law with upcoming heart valve surgery  Thank you  for choosing [facility]. We consider it a privelige to serve you.  Delivering excellent health care in a caring and  compassionate way is our goal.  Partnering with you,  so that together we can achieve this goal is our strategy.

## 2016-09-23 ENCOUNTER — Ambulatory Visit: Payer: Medicare Other | Admitting: Family Medicine

## 2016-11-03 ENCOUNTER — Other Ambulatory Visit: Payer: Self-pay | Admitting: Family Medicine

## 2016-11-25 ENCOUNTER — Other Ambulatory Visit: Payer: Self-pay | Admitting: Family Medicine

## 2016-11-27 ENCOUNTER — Other Ambulatory Visit: Payer: Self-pay | Admitting: Family Medicine

## 2016-12-07 ENCOUNTER — Other Ambulatory Visit: Payer: Self-pay | Admitting: Family Medicine

## 2016-12-23 ENCOUNTER — Ambulatory Visit: Payer: Medicare Other

## 2016-12-28 ENCOUNTER — Ambulatory Visit (INDEPENDENT_AMBULATORY_CARE_PROVIDER_SITE_OTHER): Payer: Medicare Other | Admitting: Family Medicine

## 2016-12-28 ENCOUNTER — Encounter: Payer: Self-pay | Admitting: Family Medicine

## 2016-12-28 VITALS — BP 126/82 | HR 92 | Temp 97.0°F | Resp 16 | Ht 66.0 in | Wt 172.2 lb

## 2016-12-28 DIAGNOSIS — G43109 Migraine with aura, not intractable, without status migrainosus: Secondary | ICD-10-CM | POA: Diagnosis not present

## 2016-12-28 DIAGNOSIS — M549 Dorsalgia, unspecified: Secondary | ICD-10-CM | POA: Diagnosis not present

## 2016-12-28 DIAGNOSIS — Z1211 Encounter for screening for malignant neoplasm of colon: Secondary | ICD-10-CM | POA: Diagnosis not present

## 2016-12-28 DIAGNOSIS — E785 Hyperlipidemia, unspecified: Secondary | ICD-10-CM

## 2016-12-28 DIAGNOSIS — G8929 Other chronic pain: Secondary | ICD-10-CM | POA: Diagnosis not present

## 2016-12-28 DIAGNOSIS — I10 Essential (primary) hypertension: Secondary | ICD-10-CM | POA: Diagnosis not present

## 2016-12-28 LAB — POC HEMOCCULT BLD/STL (OFFICE/1-CARD/DIAGNOSTIC): FECAL OCCULT BLD: NEGATIVE

## 2016-12-28 MED ORDER — KETOROLAC TROMETHAMINE 60 MG/2ML IM SOLN
60.0000 mg | Freq: Once | INTRAMUSCULAR | Status: AC
  Administered 2016-12-28: 60 mg via INTRAMUSCULAR

## 2016-12-28 MED ORDER — HYDROCODONE-ACETAMINOPHEN 5-325 MG PO TABS: ORAL_TABLET | ORAL | 0 refills | Status: DC

## 2016-12-28 MED ORDER — PREDNISONE 5 MG PO TABS: 5.0000 mg | ORAL_TABLET | Freq: Two times a day (BID) | ORAL | 0 refills | Status: AC

## 2016-12-28 MED ORDER — METHYLPREDNISOLONE ACETATE 80 MG/ML IJ SUSP
80.0000 mg | Freq: Once | INTRAMUSCULAR | Status: AC
  Administered 2016-12-28: 80 mg via INTRAMUSCULAR

## 2016-12-28 NOTE — Patient Instructions (Addendum)
MD follow up in 4 months, call if you need me sooner  Keep wellness appointment with nurse as before on 01/04/2017  Fasting labs needed, CBC, lipid,cmp and TSH, please get these labs first week in October    For scitaic pain , toradol and depo medrol in office today and 5 day course of prednisone is prescribed. Pan medication continues as before  Sorry to hear of recent passing of your sister  Thank you  for choosing [facility]. We consider it a privelige to serve you.  Delivering excellent health care in a caring and  compassionate way is our goal.  Partnering with you,  so that together we can achieve this goal is our strategy.

## 2016-12-30 ENCOUNTER — Telehealth: Payer: Self-pay

## 2016-12-30 NOTE — Telephone Encounter (Signed)
Left message for pt to call and reschedule awv appt only due to change in provider schedule. -nr

## 2017-01-02 ENCOUNTER — Encounter: Payer: Self-pay | Admitting: Family Medicine

## 2017-01-02 NOTE — Assessment & Plan Note (Signed)
Hyperlipidemia:Low fat diet discussed and encouraged.   Lipid Panel  Lab Results  Component Value Date   CHOL 164 07/02/2016   HDL 62 07/02/2016   LDLCALC 74 07/02/2016   TRIG 142 07/02/2016   CHOLHDL 2.6 07/02/2016  Controlled, no change in medication

## 2017-01-02 NOTE — Assessment & Plan Note (Signed)
Uncontrolled.Toradol and depo medrol administered IM in the office , to be followed by a short course of oral prednisone   

## 2017-01-02 NOTE — Assessment & Plan Note (Signed)
The patient's Controlled Substance registry is reviewed and compliance confirmed. Adequacy of  Pain control and level of function is assessed. Medication dosing is adjusted as deemed appropriate. Twelve weeks of medication is prescribed , patient signs for the script and is provided with a follow up appointment between 16 weeks .  

## 2017-01-02 NOTE — Assessment & Plan Note (Signed)
Controlled, no change in medication DASH diet and commitment to daily physical activity for a minimum of 30 minutes discussed and encouraged, as a part of hypertension management. The importance of attaining a healthy weight is also discussed.  BP/Weight 12/28/2016 09/10/2016 06/22/2016 12/19/2015 07/18/2015 02/26/2015 1/77/1165  Systolic BP 790 383 338 329 191 660 600  Diastolic BP 82 84 84 72 66 76 74  Wt. (Lbs) 172.2 176 178 173 167 171 168.08  BMI 27.79 28.41 28.73 27.92 26.97 27.61 27.14

## 2017-01-02 NOTE — Assessment & Plan Note (Signed)
Controlled, no change in medication  

## 2017-01-02 NOTE — Progress Notes (Signed)
Sara Woods     MRN: [id number]      DOB: Feb 03, 1946   HPI [name] is here for follow up and re-evaluation of chronic medical conditions, medication management and review of any available recent lab and radiology data.  Preventive health is updated, specifically  Cancer screening and Immunization.  Rectal exam is due and is done Questions or concerns regarding consultations or procedures which the PT has had in the interim are  addressed. The PT denies any adverse reactions to current medications since the last visit.  C/o increased sciatic pain and requests injections in office for same and short course of prednisone , also needs to continue  Chronic pajn management The [place] registry is reviewed and she is compliant  ROS Denies recent fever or chills. Denies sinus pressure, nasal congestion, ear pain or sore throat. Denies chest congestion, productive cough or wheezing. Denies chest pains, palpitations and leg swelling Denies abdominal pain, nausea, vomiting,diarrhea or constipation.   Denies dysuria, frequency, hesitancy or incontinence. . Denies headaches, seizures, numbness, or tingling. Denies depression, anxiety or insomnia. Denies skin break down or rash.   PE  BP 126/82 (BP Location: Left Arm, Patient Position: Sitting, Cuff Size: Normal)   Pulse 92   Temp (!) 97 F (36.1 C) (Temporal)   Resp 16   Ht 5\' 6"  (1.676 m)   Wt 172 lb 3.2 oz (78.1 kg)   SpO2 98%   BMI 27.79 kg/m   Patient alert and oriented and in no cardiopulmonary distress.  HEENT: No facial asymmetry, EOMI,   oropharynx pink and moist.  Neck supple no JVD, no mass.  Chest: Clear to auscultation bilaterally.  CVS: S1, S2 no murmurs, no S3.Regular rate.  ABD: Soft non tender.  Rectal: no mass, heme negative stool Ext: No edema  MS: Adequate ROM spine, shoulders, hips and knees.  Skin: Intact, no ulcerations or rash noted.  Psych: Good eye contact, normal affect. Memory intact not anxious  or depressed appearing.  CNS: CN 2-12 intact, power,  normal throughout.no focal deficits noted.   Assessment & Plan  Encounter for chronic pain management The patient's Controlled Substance registry is reviewed and compliance confirmed. Adequacy of  Pain control and level of function is assessed. Medication dosing is adjusted as deemed appropriate. Twelve weeks of medication is prescribed , patient signs for the script and is provided with a follow up appointment between 16 weeks .   Back pain with radiation Uncontrolled.Toradol and depo medrol administered IM in the office , to be followed by a short course of oral prednisone .   Migraine with aura Controlled, no change in medication   Essential hypertension Controlled, no change in medication DASH diet and commitment to daily physical activity for a minimum of 30 minutes discussed and encouraged, as a part of hypertension management. The importance of attaining a healthy weight is also discussed.  BP/Weight 12/28/2016 09/10/2016 06/22/2016 12/19/2015 07/18/2015 02/26/2015 0/02/2724  Systolic BP 366 440 347 425 956 387 564  Diastolic BP 82 84 84 72 66 76 74  Wt. (Lbs) 172.2 176 178 173 167 171 168.08  BMI 27.79 28.41 28.73 27.92 26.97 27.61 27.14       Hyperlipidemia LDL goal <100 Hyperlipidemia:Low fat diet discussed and encouraged.   Lipid Panel  Lab Results  Component Value Date   CHOL 164 07/02/2016   HDL 62 07/02/2016   LDLCALC 74 07/02/2016   TRIG 142 07/02/2016   CHOLHDL 2.6 07/02/2016  Controlled,  no change in medication

## 2017-01-04 ENCOUNTER — Ambulatory Visit: Payer: Medicare Other

## 2017-01-06 ENCOUNTER — Ambulatory Visit (INDEPENDENT_AMBULATORY_CARE_PROVIDER_SITE_OTHER): Payer: Medicare Other

## 2017-01-06 VITALS — BP 132/78 | HR 60 | Temp 98.2°F | Ht 66.0 in | Wt 173.0 lb

## 2017-01-06 DIAGNOSIS — Z0000 Encounter for general adult medical examination without abnormal findings: Secondary | ICD-10-CM | POA: Diagnosis not present

## 2017-01-06 DIAGNOSIS — Z1231 Encounter for screening mammogram for malignant neoplasm of breast: Secondary | ICD-10-CM

## 2017-01-06 DIAGNOSIS — Z1239 Encounter for other screening for malignant neoplasm of breast: Secondary | ICD-10-CM

## 2017-01-06 DIAGNOSIS — Z23 Encounter for immunization: Secondary | ICD-10-CM | POA: Diagnosis not present

## 2017-01-06 NOTE — Progress Notes (Signed)
Subjective:   TASHYRA ADDUCI is a 71 y.o. female who presents for Medicare Annual (Subsequent) preventive examination.  Review of Systems:  Cardiac Risk Factors include: advanced age (>95men, >75 women);dyslipidemia;hypertension;sedentary lifestyle     Objective:     Vitals: BP 132/78   Pulse 60   Temp 98.2 F (36.8 C) (Oral)   Ht 5\' 6"  (1.676 m)   Wt 173 lb (78.5 kg)   BMI 27.92 kg/m   Body mass index is 27.92 kg/m.   Tobacco History  Smoking Status  . Never Smoker  Smokeless Tobacco  . Never Used     Counseling given: Not Answered   Past Medical History:  Diagnosis Date  . Allergic rhinitis   . Anxiety   . Arthritis   . Breast cancer ([place]) 1990 approx  . Depression approx 1983  . History of migraines   . Hyperlipidemia approx 2006  . Hypertension approx 2000  . MRSA infection    Recurrent abcesses  . Recurrent miscarriages due to luteal phase defect, not pregnant    5 miscarriages with 1 living adult son  . Skin cancer (melanoma) ([facility]) 2001   Dr Tarri Glenn in [place]   Past Surgical History:  Procedure Laterality Date  . SKIN CANCER DESTRUCTION     Back of left leg, right shoulder  . TONSILLECTOMY     Childhood  . TUBAL LIGATION    . TUMOR EXCISION     Tongue   Family History  Problem Relation Age of Onset  . Prostate cancer Father        metastic, 04/2006  . Obesity Sister   . Fibromyalgia Sister   . Osteoporosis Mother   . Cerebrovascular Disease Mother   . Brain cancer Mother   . Cerebrovascular Disease Brother   . Stroke Son        x3, Vertebral artery dissection, January 2009  . Hypertension Son    History  Sexual Activity  . Sexual activity: No    Outpatient Encounter Prescriptions as of 01/06/2017  Medication Sig  . alendronate (FOSAMAX) 70 MG tablet TAKE ONE TABLET ONCE A WEEK IN THE MORNING WITH A FULL GLASS OF WATER ON AN EMPTY STOMACH. DO NOT LAY DOWN FOR 30 MINUTES.  . Calcium-Vitamin D (CALTRATE 600 PLUS-VIT D PO) Take 2  tablets by mouth daily.   . diclofenac (VOLTAREN) 50 MG EC tablet TAKE ONE TABLET BY MOUTH TWICE DAILY - REPLACES MELOXICAM  . doxepin (SINEQUAN) 10 MG capsule TAKE TWO (2) CAPSULES BY MOUTH AT BEDTIME  . fluticasone (FLONASE) 50 MCG/ACT nasal spray USE ONE SPRAY IN EACH NOSTRIL ONCE DAILY. SHAKE GENTLY BEFORE USING. (Patient taking differently: USE ONE SPRAY IN EACH NOSTRIL ONCE DAILY AS NEEDED. SHAKE GENTLY BEFORE USING.)  . gabapentin (NEURONTIN) 300 MG capsule TAKE ONE CAPSULE BY MOUTH AT BEDTIME  . HYDROcodone-acetaminophen (NORCO/VICODIN) 5-325 MG tablet One tablet once daily as  Needed, for uncontrolled back pain  . KRILL OIL 1000 MG CAPS Take 1 capsule by mouth daily.   Marland Kitchen lisinopril (PRINIVIL,ZESTRIL) 20 MG tablet TAKE 1 AND 1/2 TABLETS BY MOUTH ONCE DAILY.  . meclizine (ANTIVERT) 25 MG tablet TAKE ONE TABLET BY MOUTH THREE TIMES DAILY AS NEEDED  . multivitamin (THERAGRAN) per tablet Take 1 tablet by mouth daily.   . pravastatin (PRAVACHOL) 80 MG tablet TAKE ONE TABLET BY MOUTH EVERY EVENING.  . ranitidine (ZANTAC) 300 MG tablet Take 1 tablet (300 mg total) by mouth at bedtime.  . topiramate (TOPAMAX)  50 MG tablet TAKE ONE TABLET BY MOUTH TWICE DAILY  . [DISCONTINUED] diclofenac (VOLTAREN) 50 MG EC tablet TAKE ONE TABLET BY MOUTH TWICE DAILY - REPLACES MELOXICAM   No facility-administered encounter medications on file as of 01/06/2017.     Activities of Daily Living In your present state of health, do you have any difficulty performing the following activities: 01/06/2017 12/28/2016  Hearing? N N  Vision? Y N  Comment has follow up appointment at [place] -  Difficulty concentrating or making decisions? N N  Walking or climbing stairs? N N  Dressing or bathing? N N  Doing errands, shopping? N N  Preparing Food and eating ? N -  Using the Toilet? N -  In the past six months, have you accidently leaked urine? N -  Do you have problems with loss of bowel control? N -  Managing  your Medications? N -  Managing your Finances? N -  Housekeeping or managing your Housekeeping? N -  Some recent data might be hidden    Patient Care Team: Fayrene Helper, MD as PCP - Danella Sensing, MD as Referring Physician (Unknown Physician Specialty)    Assessment:    Exercise Activities and Dietary recommendations Current Exercise Habits: The patient does not participate in regular exercise at present, Exercise limited by: None identified  Goals    . Exercise 3x per week (30 min per time)          Recommend starting a routine exercise program at least 3 days a week for 30-45 minutes at a time as tolerated.        Fall Risk Fall Risk  01/06/2017 12/28/2016 09/10/2016 07/18/2015 07/18/2015  Falls in the past year? No No No No No  Number falls in past yr: - - - - -  Risk Factor Category  - - - - -  Risk for fall due to : - - - - History of fall(s)   Depression Screen PHQ 2/9 Scores 01/06/2017 12/28/2016 09/10/2016 12/19/2015  PHQ - 2 Score 0 0 0 0  PHQ- 9 Score 0 - - -     Cognitive Function: Normal   6CIT Screen 01/06/2017  What Year? 0 points  What month? 0 points  What time? 0 points  Count back from 20 0 points  Months in reverse 0 points  Repeat phrase 0 points  Total Score 0    Immunization History  Administered Date(s) Administered  . H1N1 04/19/2008  . Influenza Split 01/30/2011, 02/24/2012  . Influenza Whole 02/23/2007, 02/23/2008  . Influenza,inj,Quad PF,6+ Mos 02/27/2013, 04/19/2014, 02/26/2015, 03/23/2016, 01/06/2017  . Pneumococcal Conjugate-13 12/06/2013  . Pneumococcal Polysaccharide-23 12/19/2010  . Td 02/24/2010  . Zoster 12/19/2010   Screening Tests Health Maintenance  Topic Date Due  . MAMMOGRAM  03/03/2017  . TETANUS/TDAP  02/25/2020  . COLONOSCOPY  08/04/2020  . INFLUENZA VACCINE  Completed  . DEXA SCAN  Completed  . Hepatitis C Screening  Completed  . PNA vac Low Risk Adult  Completed      Plan:   I have personally  reviewed and noted the following in the patient's chart:   . Medical and social history . Use of alcohol, tobacco or illicit drugs  . Current medications and supplements . Functional ability and status . Nutritional status . Physical activity . Advanced directives . List of other physicians . Hospitalizations, surgeries, and ER visits in previous 12 months . Vitals . Screenings to include cognitive, depression, and  falls . Referrals and appointments  In addition, I have reviewed and discussed with patient certain preventive protocols, quality metrics, and best practice recommendations. A written personalized care plan for preventive services as well as general preventive health recommendations were provided to patient.     Stormy Fabian, LPN  6/70/1100

## 2017-01-06 NOTE — Patient Instructions (Addendum)
Sara Woods , Thank you for taking time to come for your Medicare Wellness Visit. I appreciate your ongoing commitment to your health goals. Please review the following plan we discussed and let me know if I can assist you in the future.   Screening recommendations/referrals: Colonoscopy: Up to date, next due 07/2020 Mammogram: Due, ordered today please call [facility] and schedule this as soon as you are able Bone Density: Up to date Recommended yearly ophthalmology/optometry visit for glaucoma screening and checkup Recommended yearly dental visit for hygiene and checkup  Vaccinations: Influenza vaccine: Administered today Pneumococcal vaccine: Up to date Tdap vaccine: Up to date, next due 02/2020 Shingles vaccine: Done    Advanced directives: Advance directive discussed with you today. Once this is complete please bring a copy in to our office so we can scan it into your chart.  Conditions/risks identified: Pre-obese, recommend starting a routine exercise program at least 3 days a week for 30-45 minutes at a time as tolerated.   Next appointment: Follow up with Dr. Moshe Cipro on 04/29/2017 at 1:20 pm. Follow up in 1 year for your annual wellness visit.   Preventive Care 71 Years and Older, Female Preventive care refers to lifestyle choices and visits with your health care provider that can promote health and wellness. What does preventive care include?  A yearly physical exam. This is also called an annual well check.  Dental exams once or twice a year.  Routine eye exams. Ask your health care provider how often you should have your eyes checked.  Personal lifestyle choices, including:  Daily care of your teeth and gums.  Regular physical activity.  Eating a healthy diet.  Avoiding tobacco and drug use.  Limiting alcohol use.  Practicing safe sex.  Taking low-dose aspirin every day.  Taking vitamin and mineral supplements as recommended by your health care provider. What  happens during an annual well check? The services and screenings done by your health care provider during your annual well check will depend on your age, overall health, lifestyle risk factors, and family history of disease. Counseling  Your health care provider may ask you questions about your:  Alcohol use.  Tobacco use.  Drug use.  Emotional well-being.  Home and relationship well-being.  Sexual activity.  Eating habits.  History of falls.  Memory and ability to understand (cognition).  Work and work Statistician.  Reproductive health. Screening  You may have the following tests or measurements:  Height, weight, and BMI.  Blood pressure.  Lipid and cholesterol levels. These may be checked every 5 years, or more frequently if you are over 26 years old.  Skin check.  Lung cancer screening. You may have this screening every year starting at age 68 if you have a 30-pack-year history of smoking and currently smoke or have quit within the past 15 years.  Fecal occult blood test (FOBT) of the stool. You may have this test every year starting at age 34.  Flexible sigmoidoscopy or colonoscopy. You may have a sigmoidoscopy every 5 years or a colonoscopy every 10 years starting at age 70.  Hepatitis C blood test.  Hepatitis B blood test.  Sexually transmitted disease (STD) testing.  Diabetes screening. This is done by checking your blood sugar (glucose) after you have not eaten for a while (fasting). You may have this done every 1-3 years.  Bone density scan. This is done to screen for osteoporosis. You may have this done starting at age 46.  Mammogram. This  may be done every 1-2 years. Talk to your health care provider about how often you should have regular mammograms. Talk with your health care provider about your test results, treatment options, and if necessary, the need for more tests. Vaccines  Your health care provider may recommend certain vaccines, such  as:  Influenza vaccine. This is recommended every year.  Tetanus, diphtheria, and acellular pertussis (Tdap, Td) vaccine. You may need a Td booster every 10 years.  Zoster vaccine. You may need this after age 18.  Pneumococcal 13-valent conjugate (PCV13) vaccine. One dose is recommended after age 40.  Pneumococcal polysaccharide (PPSV23) vaccine. One dose is recommended after age 75. Talk to your health care provider about which screenings and vaccines you need and how often you need them. This information is not intended to replace advice given to you by your health care provider. Make sure you discuss any questions you have with your health care provider. Document Released: 05/24/2015 Document Revised: 01/15/2016 Document Reviewed: 02/26/2015 Elsevier Interactive Patient Education  2017 [place] Prevention in the Home Falls can cause injuries. They can happen to people of all ages. There are many things you can do to make your home safe and to help prevent falls. What can I do on the outside of my home?  Regularly fix the edges of walkways and driveways and fix any cracks.  Remove anything that might make you trip as you walk through a door, such as a raised step or threshold.  Trim any bushes or trees on the path to your home.  Use bright outdoor lighting.  Clear any walking paths of anything that might make someone trip, such as rocks or tools.  Regularly check to see if handrails are loose or broken. Make sure that both sides of any steps have handrails.  Any raised decks and porches should have guardrails on the edges.  Have any leaves, snow, or ice cleared regularly.  Use sand or salt on walking paths during winter.  Clean up any spills in your garage right away. This includes oil or grease spills. What can I do in the bathroom?  Use night lights.  Install grab bars by the toilet and in the tub and shower. Do not use towel bars as grab bars.  Use  non-skid mats or decals in the tub or shower.  If you need to sit down in the shower, use a plastic, non-slip stool.  Keep the floor dry. Clean up any water that spills on the floor as soon as it happens.  Remove soap buildup in the tub or shower regularly.  Attach bath mats securely with double-sided non-slip rug tape.  Do not have throw rugs and other things on the floor that can make you trip. What can I do in the bedroom?  Use night lights.  Make sure that you have a light by your bed that is easy to reach.  Do not use any sheets or blankets that are too big for your bed. They should not hang down onto the floor.  Have a firm chair that has side arms. You can use this for support while you get dressed.  Do not have throw rugs and other things on the floor that can make you trip. What can I do in the kitchen?  Clean up any spills right away.  Avoid walking on wet floors.  Keep items that you use a lot in easy-to-reach places.  If you need to reach something above  you, use a strong step stool that has a grab bar.  Keep electrical cords out of the way.  Do not use floor polish or wax that makes floors slippery. If you must use wax, use non-skid floor wax.  Do not have throw rugs and other things on the floor that can make you trip. What can I do with my stairs?  Do not leave any items on the stairs.  Make sure that there are handrails on both sides of the stairs and use them. Fix handrails that are broken or loose. Make sure that handrails are as long as the stairways.  Check any carpeting to make sure that it is firmly attached to the stairs. Fix any carpet that is loose or worn.  Avoid having throw rugs at the top or bottom of the stairs. If you do have throw rugs, attach them to the floor with carpet tape.  Make sure that you have a light switch at the top of the stairs and the bottom of the stairs. If you do not have them, ask someone to add them for you. What  else can I do to help prevent falls?  Wear shoes that:  Do not have high heels.  Have rubber bottoms.  Are comfortable and fit you well.  Are closed at the toe. Do not wear sandals.  If you use a stepladder:  Make sure that it is fully opened. Do not climb a closed stepladder.  Make sure that both sides of the stepladder are locked into place.  Ask someone to hold it for you, if possible.  Clearly mark and make sure that you can see:  Any grab bars or handrails.  First and last steps.  Where the edge of each step is.  Use tools that help you move around (mobility aids) if they are needed. These include:  Canes.  Walkers.  Scooters.  Crutches.  Turn on the lights when you go into a dark area. Replace any light bulbs as soon as they burn out.  Set up your furniture so you have a clear path. Avoid moving your furniture around.  If any of your floors are uneven, fix them.  If there are any pets around you, be aware of where they are.  Review your medicines with your doctor. Some medicines can make you feel dizzy. This can increase your chance of falling. Ask your doctor what other things that you can do to help prevent falls. This information is not intended to replace advice given to you by your health care provider. Make sure you discuss any questions you have with your health care provider. Document Released: 02/21/2009 Document Revised: 10/03/2015 Document Reviewed: 06/01/2014 Elsevier Interactive Patient Education  2017 [employer].

## 2017-02-08 ENCOUNTER — Other Ambulatory Visit: Payer: Self-pay | Admitting: Family Medicine

## 2017-03-02 ENCOUNTER — Other Ambulatory Visit: Payer: Self-pay | Admitting: Family Medicine

## 2017-03-02 NOTE — Telephone Encounter (Signed)
Seen 8 20 18 

## 2017-03-08 ENCOUNTER — Other Ambulatory Visit: Payer: Self-pay | Admitting: Family Medicine

## 2017-03-09 ENCOUNTER — Telehealth: Payer: Self-pay | Admitting: Family Medicine

## 2017-03-09 DIAGNOSIS — E785 Hyperlipidemia, unspecified: Secondary | ICD-10-CM

## 2017-03-09 DIAGNOSIS — I10 Essential (primary) hypertension: Secondary | ICD-10-CM

## 2017-03-09 LAB — COMPLETE METABOLIC PANEL WITH GFR
AG RATIO: 1.6 (calc) (ref 1.0–2.5)
ALKALINE PHOSPHATASE (APISO): 73 U/L (ref 33–130)
ALT: 16 U/L (ref 6–29)
AST: 18 U/L (ref 10–35)
Albumin: 4.2 g/dL (ref 3.6–5.1)
BUN: 22 mg/dL (ref 7–25)
CALCIUM: 9.9 mg/dL (ref 8.6–10.4)
CHLORIDE: 110 mmol/L (ref 98–110)
CO2: 27 mmol/L (ref 20–32)
Creat: 0.88 mg/dL (ref 0.60–0.93)
GFR, EST NON AFRICAN AMERICAN: 66 mL/min/{1.73_m2} (ref >=60)
GFR, Est African American: 77 mL/min/{1.73_m2} (ref >=60)
Globulin: 2.6 g/dL (calc) (ref 1.9–3.7)
Glucose, Bld: 91 mg/dL (ref 65–139)
POTASSIUM: 4.2 mmol/L (ref 3.5–5.3)
Sodium: 144 mmol/L (ref 135–146)
Total Bilirubin: 0.6 mg/dL (ref 0.2–1.2)
Total Protein: 6.8 g/dL (ref 6.1–8.1)

## 2017-03-09 LAB — LIPID PANEL
CHOL/HDL RATIO: 2.9 (calc) (ref <5.0)
Cholesterol: 174 mg/dL (ref <200)
HDL: 60 mg/dL (ref >50)
LDL Cholesterol (Calc): 88 mg/dL (calc)
NON-HDL CHOLESTEROL (CALC): 114 mg/dL (ref <130)
Triglycerides: 164 mg/dL — ABNORMAL HIGH (ref <150)

## 2017-03-09 LAB — CBC
HCT: 42 % (ref 35.0–45.0)
HEMOGLOBIN: 13.8 g/dL (ref 11.7–15.5)
MCH: 30.8 pg (ref 27.0–33.0)
MCHC: 32.9 g/dL (ref 32.0–36.0)
MCV: 93.8 fL (ref 80.0–100.0)
MPV: 11.5 fL (ref 7.5–12.5)
Platelets: 333 10*3/uL (ref 140–400)
RBC: 4.48 10*6/uL (ref 3.80–5.10)
RDW: 12.1 % (ref 11.0–15.0)
WBC: 6.5 10*3/uL (ref 3.8–10.8)

## 2017-03-09 LAB — TSH: TSH: 1.24 mIU/L (ref 0.40–4.50)

## 2017-03-10 ENCOUNTER — Encounter: Payer: Self-pay | Admitting: Family Medicine

## 2017-03-12 ENCOUNTER — Other Ambulatory Visit: Payer: Self-pay | Admitting: Family Medicine

## 2017-04-16 ENCOUNTER — Other Ambulatory Visit: Payer: Self-pay

## 2017-04-16 MED ORDER — DICLOFENAC SODIUM 50 MG PO TBEC: 50.0000 mg | DELAYED_RELEASE_TABLET | Freq: Two times a day (BID) | ORAL | 5 refills | Status: DC

## 2017-04-16 MED ORDER — TOPIRAMATE 50 MG PO TABS: 50.0000 mg | ORAL_TABLET | Freq: Two times a day (BID) | ORAL | 5 refills | Status: DC

## 2017-04-16 MED ORDER — PRAVASTATIN SODIUM 80 MG PO TABS: 80.0000 mg | ORAL_TABLET | Freq: Every evening | ORAL | 1 refills | Status: DC

## 2017-04-16 MED ORDER — LISINOPRIL 20 MG PO TABS: 30.0000 mg | ORAL_TABLET | Freq: Every day | ORAL | 1 refills | Status: DC

## 2017-04-16 MED ORDER — RANITIDINE HCL 300 MG PO TABS: 300.0000 mg | ORAL_TABLET | Freq: Every day | ORAL | 4 refills | Status: DC

## 2017-04-16 MED ORDER — GABAPENTIN 300 MG PO CAPS: 300.0000 mg | ORAL_CAPSULE | Freq: Every day | ORAL | 1 refills | Status: DC

## 2017-04-20 ENCOUNTER — Ambulatory Visit: Payer: Medicare Other | Admitting: Family Medicine

## 2017-04-23 ENCOUNTER — Other Ambulatory Visit: Payer: Self-pay | Admitting: Family Medicine

## 2017-04-23 MED ORDER — MECLIZINE HCL 25 MG PO TABS: 25.0000 mg | ORAL_TABLET | Freq: Three times a day (TID) | ORAL | 3 refills | Status: DC | PRN

## 2017-04-26 ENCOUNTER — Ambulatory Visit (INDEPENDENT_AMBULATORY_CARE_PROVIDER_SITE_OTHER): Payer: Medicare Other | Admitting: Family Medicine

## 2017-04-26 ENCOUNTER — Encounter: Payer: Self-pay | Admitting: Family Medicine

## 2017-04-26 VITALS — BP 140/90 | HR 96 | Resp 16 | Ht 66.0 in | Wt 169.0 lb

## 2017-04-26 DIAGNOSIS — I10 Essential (primary) hypertension: Secondary | ICD-10-CM | POA: Diagnosis not present

## 2017-04-26 DIAGNOSIS — E785 Hyperlipidemia, unspecified: Secondary | ICD-10-CM | POA: Diagnosis not present

## 2017-04-26 DIAGNOSIS — R42 Dizziness and giddiness: Secondary | ICD-10-CM | POA: Diagnosis not present

## 2017-04-26 DIAGNOSIS — G8929 Other chronic pain: Secondary | ICD-10-CM

## 2017-04-26 DIAGNOSIS — M549 Dorsalgia, unspecified: Secondary | ICD-10-CM | POA: Diagnosis not present

## 2017-04-26 DIAGNOSIS — G43109 Migraine with aura, not intractable, without status migrainosus: Secondary | ICD-10-CM

## 2017-04-26 MED ORDER — METHYLPREDNISOLONE ACETATE 80 MG/ML IJ SUSP
80.0000 mg | Freq: Once | INTRAMUSCULAR | Status: AC
  Administered 2017-04-26: 80 mg via INTRAMUSCULAR

## 2017-04-26 MED ORDER — LISINOPRIL 30 MG PO TABS: 30.0000 mg | ORAL_TABLET | Freq: Every day | ORAL | 3 refills | Status: DC

## 2017-04-26 MED ORDER — HYDROCODONE-ACETAMINOPHEN 5-325 MG PO TABS: ORAL_TABLET | ORAL | 0 refills | Status: DC

## 2017-04-26 MED ORDER — KETOROLAC TROMETHAMINE 60 MG/2ML IM SOLN
60.0000 mg | Freq: Once | INTRAMUSCULAR | Status: AC
  Administered 2017-04-26: 60 mg via INTRAMUSCULAR

## 2017-04-26 NOTE — Patient Instructions (Addendum)
F/U week of April 15, call if you need me sooner  Your blood pressure is slightly elevated today, because of the stress you are under, please cut back on salty foods and canned foods Increase vegetable and fruit intake  All the best with your husband's health, better days ahead   Please schedule your mammogram as soon as you can, it is past due   Please cut back on cheese, your triglycerides are high  Thank you  for choosing  Primary Care. We consider it a privelige to serve you.  Delivering excellent health care in a caring and  compassionate way is our goal.  Partnering with you,  so that together we can achieve this goal is our strategy.

## 2017-04-29 ENCOUNTER — Ambulatory Visit: Payer: Medicare Other | Admitting: Family Medicine

## 2017-05-03 ENCOUNTER — Encounter: Payer: Self-pay | Admitting: Family Medicine

## 2017-05-03 NOTE — Assessment & Plan Note (Signed)
Controlled, no change in medication  

## 2017-05-03 NOTE — Assessment & Plan Note (Addendum)
Troiglycerides elevated , otherwise good Hyperlipidemia:Low fat diet discussed and encouraged. No med change, needs to reduce cheese   Lipid Panel  Lab Results  Component Value Date   CHOL 174 03/09/2017   HDL 60 03/09/2017   LDLCALC 74 07/02/2016   TRIG 164 (H) 03/09/2017   CHOLHDL 2.9 03/09/2017

## 2017-05-03 NOTE — Assessment & Plan Note (Signed)
Controlled , no recent flare, uses antivert , as needed only

## 2017-05-03 NOTE — Assessment & Plan Note (Signed)
Elevated due to stress and poor eating, has been overeating salt DASH diet and commitment to daily physical activity for a minimum of 30 minutes discussed and encouraged, as a part of hypertension management. The importance of attaining a healthy weight is also discussed.  BP/Weight 04/26/2017 01/06/2017 12/28/2016 09/10/2016 06/22/2016 6/70/1410 3/0/1314  Systolic BP 388 875 797 282 060 156 153  Diastolic BP 90 78 82 84 84 72 66  Wt. (Lbs) 169 173 172.2 176 178 173 167  BMI 27.28 27.92 27.79 28.41 28.73 27.92 26.97      No med change

## 2017-05-03 NOTE — Assessment & Plan Note (Signed)
Uncontrolled.Toradol and depo medrol administered IM in the office , to be followed by a short course of oral prednisone and NSAIDS.  

## 2017-05-03 NOTE — Assessment & Plan Note (Signed)
The patient's Controlled Substance registry is reviewed and compliance confirmed. Adequacy of  Pain control and level of function is assessed. Medication dosing is adjusted as deemed appropriate. Twelve weeks of medication is prescribed , patient signs for the script and is provided with a follow up appointment in 16 weeks

## 2017-05-03 NOTE — Progress Notes (Signed)
Sara Woods     MRN: [id number]      DOB: 15-Mar-1946   HPI Sara Woods is here for follow up and re-evaluation of chronic medical conditions, medication management and review of any available recent lab and radiology data.  Preventive health is updated, specifically  Cancer screening and Immunization.  Needs mammogram but has this on hold, pending decision re her husband's back surgery, currently very anxious and stressed out about this  The PT denies any adverse reactions to current medications since the last visit.  There are no new concerns.  C/o increase back pain and requests injection for this at visit today, also here for chronic pain management   ROS Denies recent fever or chills. Denies sinus pressure, nasal congestion, ear pain or sore throat. Denies chest congestion, productive cough or wheezing. Denies chest pains, palpitations and leg swelling Denies abdominal pain, nausea, vomiting,diarrhea or constipation.   Denies dysuria, frequency, hesitancy or incontinence. . Denies headaches, seizures, numbness, or tingling. . Denies skin break down or rash.   PE  BP 140/90   Pulse 96   Resp 16   Ht 5\' 6"  (1.676 m)   Wt 169 lb (76.7 kg)   SpO2 97%   BMI 27.28 kg/m   Patient alert and oriented and in no cardiopulmonary distress.  HEENT: No facial asymmetry, EOMI,   oropharynx pink and moist.  Neck supple no JVD, no mass.  Chest: Clear to auscultation bilaterally.  CVS: S1, S2 no murmurs, no S3.Regular rate.  ABD: Soft non tender.   Ext: No edema  MS: Decreased  ROM spine, adequate in  shoulders, hips and knees.  Skin: Intact, no ulcerations or rash noted.  Psych: Good eye contact, normal affect. Memory intact mildly  anxious not  depressed appearing.  CNS: CN 2-12 intact, power,  normal throughout.no focal deficits noted.   Assessment & Plan Essential hypertension Elevated due to stress and poor eating, has been overeating salt DASH diet and commitment  to daily physical activity for a minimum of 30 minutes discussed and encouraged, as a part of hypertension management. The importance of attaining a healthy weight is also discussed.  BP/Weight 04/26/2017 01/06/2017 12/28/2016 09/10/2016 06/22/2016 08/06/9240 10/16/3417  Systolic BP 622 297 989 211 941 740 814  Diastolic BP 90 78 82 84 84 72 66  Wt. (Lbs) 169 173 172.2 176 178 173 167  BMI 27.28 27.92 27.79 28.41 28.73 27.92 26.97      No med change  Back pain with radiation Uncontrolled.Toradol and depo medrol administered IM in the office , to be followed by a short course of oral prednisone and NSAIDS.   Encounter for chronic pain management The patient's Controlled Substance registry is reviewed and compliance confirmed. Adequacy of  Pain control and level of function is assessed. Medication dosing is adjusted as deemed appropriate. Twelve weeks of medication is prescribed , patient signs for the script and is provided with a follow up appointment in 16 weeks  Hyperlipidemia LDL goal <100 Troiglycerides elevated , otherwise good Hyperlipidemia:Low fat diet discussed and encouraged. No med change, needs to reduce cheese   Lipid Panel  Lab Results  Component Value Date   CHOL 174 03/09/2017   HDL 60 03/09/2017   LDLCALC 74 07/02/2016   TRIG 164 (H) 03/09/2017   CHOLHDL 2.9 03/09/2017       Migraine with aura Controlled, no change in medication   Vertigo Controlled , no recent flare, uses antivert , as needed  only

## 2017-06-07 DIAGNOSIS — L570 Actinic keratosis: Secondary | ICD-10-CM | POA: Diagnosis not present

## 2017-06-07 DIAGNOSIS — L821 Other seborrheic keratosis: Secondary | ICD-10-CM | POA: Diagnosis not present

## 2017-06-16 ENCOUNTER — Other Ambulatory Visit: Payer: Self-pay

## 2017-06-16 ENCOUNTER — Ambulatory Visit (HOSPITAL_COMMUNITY)
Admission: RE | Admit: 2017-06-16 | Discharge: 2017-06-16 | Disposition: A | Discharge status: Home / Self Care | Payer: Medicare Other | Source: Ambulatory Visit | Attending: Family Medicine | Admitting: Family Medicine

## 2017-06-16 ENCOUNTER — Other Ambulatory Visit: Payer: Self-pay | Admitting: Family Medicine

## 2017-06-16 ENCOUNTER — Ambulatory Visit (HOSPITAL_COMMUNITY): Payer: Medicare Other

## 2017-06-16 ENCOUNTER — Encounter (HOSPITAL_COMMUNITY): Payer: Self-pay

## 2017-06-16 DIAGNOSIS — Z1231 Encounter for screening mammogram for malignant neoplasm of breast: Secondary | ICD-10-CM | POA: Diagnosis not present

## 2017-08-23 ENCOUNTER — Encounter: Payer: Self-pay | Admitting: Family Medicine

## 2017-08-23 ENCOUNTER — Ambulatory Visit: Payer: Medicare Other | Admitting: Family Medicine

## 2017-08-23 ENCOUNTER — Ambulatory Visit (INDEPENDENT_AMBULATORY_CARE_PROVIDER_SITE_OTHER): Payer: Medicare Other | Admitting: Family Medicine

## 2017-08-23 VITALS — BP 138/82 | HR 74 | Temp 98.3°F | Resp 16 | Ht 66.0 in | Wt 169.0 lb

## 2017-08-23 DIAGNOSIS — I10 Essential (primary) hypertension: Secondary | ICD-10-CM | POA: Diagnosis not present

## 2017-08-23 DIAGNOSIS — M549 Dorsalgia, unspecified: Secondary | ICD-10-CM

## 2017-08-23 DIAGNOSIS — G8929 Other chronic pain: Secondary | ICD-10-CM | POA: Diagnosis not present

## 2017-08-23 DIAGNOSIS — J40 Bronchitis, not specified as acute or chronic: Secondary | ICD-10-CM

## 2017-08-23 DIAGNOSIS — E785 Hyperlipidemia, unspecified: Secondary | ICD-10-CM | POA: Diagnosis not present

## 2017-08-23 MED ORDER — PREDNISONE 5 MG PO TABS: ORAL_TABLET | ORAL | 0 refills | Status: DC

## 2017-08-23 MED ORDER — DOXYCYCLINE HYCLATE 100 MG PO TABS: 100.0000 mg | ORAL_TABLET | Freq: Two times a day (BID) | ORAL | 0 refills | Status: DC

## 2017-08-23 MED ORDER — METHYLPREDNISOLONE ACETATE 80 MG/ML IJ SUSP
80.0000 mg | Freq: Once | INTRAMUSCULAR | Status: AC
  Administered 2017-08-23: 80 mg via INTRAMUSCULAR

## 2017-08-23 MED ORDER — KETOROLAC TROMETHAMINE 60 MG/2ML IM SOLN
60.0000 mg | Freq: Once | INTRAMUSCULAR | Status: AC
  Administered 2017-08-23: 60 mg via INTRAMUSCULAR

## 2017-08-23 MED ORDER — BENZONATATE 100 MG PO CAPS: 100.0000 mg | ORAL_CAPSULE | Freq: Two times a day (BID) | ORAL | 0 refills | Status: DC | PRN

## 2017-08-23 MED ORDER — HYDROCODONE-ACETAMINOPHEN 5-325 MG PO TABS: ORAL_TABLET | ORAL | 0 refills | Status: DC

## 2017-08-23 MED ORDER — PROMETHAZINE-DM 6.25-15 MG/5ML PO SYRP
ORAL_SOLUTION | ORAL | 0 refills | Status: DC
Start: 2017-08-23 — End: 2018-04-11

## 2017-08-23 NOTE — Progress Notes (Signed)
Sara Woods     MRN: [id number]      DOB: 08/08/1945   HPI Sara Woods is here for follow up and re-evaluation of chronic medical conditions, medication management ,in particular chronic pain management ,and review of any available recent lab and radiology data.  Preventive health is updated, specifically  Cancer screening and Immunization.    2.5 week h/o chills , cough, sinus pressure, yellow green drainage and sputum. Temp was up to 102 as recently as 6 days ago  Right Ear pain and sore throat. C/o increased low back pain with cough and generalized illness, requests injrections at visit for back  Pain and to continue pain management as before, her registry is reviewed  ROS Denies chest pains, palpitations and leg swelling Denies abdominal pain, nausea, vomiting,diarrhea or constipation.   Denies dysuria, frequency, hesitancy or incontinence.  Denies headaches, seizures, numbness, or tingling. Denies uncontrolled  Depression or  anxiety however continues to experience significant stress as far as poor health of her spouse is concerned. Denies skin break down or rash.   PE  BP 138/82   Pulse 74   Temp 98.3 F (36.8 C) (Oral)   Resp 16   Ht 5\' 6"  (1.676 m)   Wt 169 lb (76.7 kg)   SpO2 98%   BMI 27.28 kg/m   Patient alert and oriented and in no cardiopulmonary distress.ill appearing and in pain  HEENT: No facial asymmetry, EOMI,   oropharynx pink and moist.  Neck adequate ROM right anterior cervical adenitis right maxillary sinus tenderness no JVD, no mass.both TM clear with good light reflex  Chest: Adequate air entry bilaterally though slightly reduced, bibasilar crackles and few wheezes .  CVS: S1, S2 no murmurs, no S3.Regular rate.  ABD: Soft non tender.   Ext: No edema  MS: decreased  ROM lumbar  Spine, normal in shoulders, hips and knees.  Skin: Intact, no ulcerations or rash noted.  Psych: Good eye contact, normal affect. Memory intact not anxious or  depressed appearing.  CNS: CN 2-12 intact, power,  normal throughout.no focal deficits noted.   Assessment & Plan  Bronchitis Decongestant, antibiotic and cough suppressant prescribed, pt advised to have CXR, as she has presented in the past with similar symptoms and has not had a CXR for several years, but declined. I have advised her that should her symptoms not completely resolve in the next 2 weeks , she absolutely needs to have a CXR  Back pain with radiation Uncontrolled.Toradol and depo medrol administered IM in the office , to be followed by a short course of oral prednisone and NSAIDS.   Encounter for chronic pain management The patient's Controlled Substance registry is reviewed and compliance confirmed. Adequacy of  Pain control and level of function is assessed. Medication dosing is adjusted as deemed appropriate. Twelve weeks of medication is prescribed , patient signs for the script and is provided with a follow up appointment between 11 to 12 weeks .   Essential hypertension Sub optimal control , elevated at visit but patient in pain, no med change DASH diet and commitment to daily physical activity for a minimum of 30 minutes discussed and encouraged, as a part of hypertension management. The importance of attaining a healthy weight is also discussed.  BP/Weight 08/23/2017 04/26/2017 01/06/2017 12/28/2016 09/10/2016 06/22/2016 9/48/5462  Systolic BP 703 500 938 182 993 716 967  Diastolic BP 82 90 78 82 84 84 72  Wt. (Lbs) 169 169 173 172.2 176  178 173  BMI 27.28 27.28 27.92 27.79 28.41 28.73 27.92       Hyperlipidemia LDL goal <100 Hyperlipidemia:Low fat diet discussed and encouraged.   Lipid Panel  Lab Results  Component Value Date   CHOL 174 03/09/2017   HDL 60 03/09/2017   LDLCALC 88 03/09/2017   TRIG 164 (H) 03/09/2017   CHOLHDL 2.9 03/09/2017    Updated lab needed at/ before next visit.

## 2017-08-23 NOTE — Patient Instructions (Addendum)
F/u in 4 months, call if you need me  Before  Wellness visit due 8/30 or after with nurse please also schedule  Decongestant and antibiotic are prescribed, also cough suppressant  I recommend CXR and if you continue to cough, you need to get the CXR  Fasting lipid, cmp and EGFR due, please get in the next 1 to 2 weeks  Pain medication as before  Toradol 60 mg and depo medrol 80 mg in office and short course of prednisone sent in for back pain  Hope you  feel better soon  Thank you  for choosing [facility]. We consider it a privelige to serve you.  Delivering excellent health care in a caring and  compassionate way is our goal.  Partnering with you,  so that together we can achieve this goal is our strategy.

## 2017-08-30 ENCOUNTER — Other Ambulatory Visit: Payer: Self-pay | Admitting: Family Medicine

## 2017-08-30 ENCOUNTER — Ambulatory Visit: Payer: Medicare Other | Admitting: Family Medicine

## 2017-09-05 ENCOUNTER — Encounter: Payer: Self-pay | Admitting: Family Medicine

## 2017-09-05 NOTE — Assessment & Plan Note (Signed)
The patient's Controlled Substance registry is reviewed and compliance confirmed. Adequacy of  Pain control and level of function is assessed. Medication dosing is adjusted as deemed appropriate. Twelve weeks of medication is prescribed , patient signs for the script and is provided with a follow up appointment between 11 to 12 weeks .  

## 2017-09-05 NOTE — Assessment & Plan Note (Signed)
Sub optimal control , elevated at visit but patient in pain, no med change DASH diet and commitment to daily physical activity for a minimum of 30 minutes discussed and encouraged, as a part of hypertension management. The importance of attaining a healthy weight is also discussed.  BP/Weight 08/23/2017 04/26/2017 01/06/2017 12/28/2016 09/10/2016 06/22/2016 0/40/4591  Systolic BP 368 599 234 144 360 165 800  Diastolic BP 82 90 78 82 84 84 72  Wt. (Lbs) 169 169 173 172.2 176 178 173  BMI 27.28 27.28 27.92 27.79 28.41 28.73 27.92

## 2017-09-05 NOTE — Assessment & Plan Note (Signed)
Hyperlipidemia:Low fat diet discussed and encouraged.   Lipid Panel  Lab Results  Component Value Date   CHOL 174 03/09/2017   HDL 60 03/09/2017   LDLCALC 88 03/09/2017   TRIG 164 (H) 03/09/2017   CHOLHDL 2.9 03/09/2017    Updated lab needed at/ before next visit.

## 2017-09-05 NOTE — Assessment & Plan Note (Signed)
Uncontrolled.Toradol and depo medrol administered IM in the office , to be followed by a short course of oral prednisone and NSAIDS.  

## 2017-09-05 NOTE — Assessment & Plan Note (Signed)
Decongestant, antibiotic and cough suppressant prescribed, pt advised to have CXR, as she has presented in the past with similar symptoms and has not had a CXR for several years, but declined. I have advised her that should her symptoms not completely resolve in the next 2 weeks , she absolutely needs to have a CXR

## 2017-09-09 ENCOUNTER — Ambulatory Visit: Payer: Medicare Other | Admitting: Family Medicine

## 2017-09-11 ENCOUNTER — Other Ambulatory Visit: Payer: Self-pay | Admitting: Family Medicine

## 2017-09-13 ENCOUNTER — Telehealth: Payer: Self-pay

## 2017-09-13 ENCOUNTER — Other Ambulatory Visit: Payer: Self-pay | Admitting: Family Medicine

## 2017-09-13 MED ORDER — DOXYCYCLINE HYCLATE 100 MG PO TABS: 100.0000 mg | ORAL_TABLET | Freq: Two times a day (BID) | ORAL | 0 refills | Status: DC

## 2017-09-13 NOTE — Telephone Encounter (Signed)
1 week supply of doxycycline prescribed , pls let her know and safe and enjoyable trip!

## 2017-09-13 NOTE — Telephone Encounter (Signed)
Patient states she is needing a refill on her antibiotic. Said the infection has gotten a little better but still there and they are going out of town in a few days and really doesn't want the trip messed up due to this. Please advise

## 2017-09-14 NOTE — Telephone Encounter (Signed)
Patient aware.

## 2017-09-30 DIAGNOSIS — I10 Essential (primary) hypertension: Secondary | ICD-10-CM | POA: Diagnosis not present

## 2017-09-30 DIAGNOSIS — E785 Hyperlipidemia, unspecified: Secondary | ICD-10-CM | POA: Diagnosis not present

## 2017-09-30 LAB — COMPLETE METABOLIC PANEL WITH GFR
AG Ratio: 1.5 (calc) (ref 1.0–2.5)
ALKALINE PHOSPHATASE (APISO): 94 U/L (ref 33–130)
ALT: 17 U/L (ref 6–29)
AST: 18 U/L (ref 10–35)
Albumin: 4 g/dL (ref 3.6–5.1)
BILIRUBIN TOTAL: 0.4 mg/dL (ref 0.2–1.2)
BUN / CREAT RATIO: 32 (calc) — AB (ref 6–22)
BUN: 27 mg/dL — AB (ref 7–25)
CHLORIDE: 109 mmol/L (ref 98–110)
CO2: 28 mmol/L (ref 20–32)
CREATININE: 0.85 mg/dL (ref 0.60–0.93)
Calcium: 10.2 mg/dL (ref 8.6–10.4)
GFR, Est African American: 79 mL/min/{1.73_m2} (ref >=60)
GFR, Est Non African American: 68 mL/min/{1.73_m2} (ref >=60)
Globulin: 2.7 g/dL (calc) (ref 1.9–3.7)
Glucose, Bld: 85 mg/dL (ref 65–99)
Potassium: 4 mmol/L (ref 3.5–5.3)
Sodium: 142 mmol/L (ref 135–146)
Total Protein: 6.7 g/dL (ref 6.1–8.1)

## 2017-09-30 LAB — LIPID PANEL
CHOLESTEROL: 214 mg/dL — AB (ref <200)
HDL: 52 mg/dL (ref >50)
LDL CHOLESTEROL (CALC): 130 mg/dL — AB
Non-HDL Cholesterol (Calc): 162 mg/dL (calc) — ABNORMAL HIGH (ref <130)
TRIGLYCERIDES: 188 mg/dL — AB (ref <150)
Total CHOL/HDL Ratio: 4.1 (calc) (ref <5.0)

## 2017-10-01 ENCOUNTER — Other Ambulatory Visit: Payer: Self-pay | Admitting: Family Medicine

## 2017-10-15 ENCOUNTER — Telehealth: Payer: Self-pay

## 2017-10-15 ENCOUNTER — Other Ambulatory Visit: Payer: Self-pay | Admitting: Family Medicine

## 2017-10-15 DIAGNOSIS — J40 Bronchitis, not specified as acute or chronic: Secondary | ICD-10-CM

## 2017-10-15 MED ORDER — ROSUVASTATIN CALCIUM 40 MG PO TABS: 40.0000 mg | ORAL_TABLET | Freq: Every day | ORAL | 5 refills | Status: DC

## 2017-10-15 NOTE — Telephone Encounter (Signed)
Spoke with patient and advised her Dr.Simpson wants her to have a chest xray done before Monday. She stated she can't have it done before Monday. I told her she could have the xray done over the weekend. I also told her Dr. Moshe Cipro wanted her to have a sputum c/s done to see what was growing. I asked her if she could go have this done today and she said no. I told her I would let Dr.Simpson know, but the orders were in. They had to take her mother in law to [facility] on Monday and all of this had already been set up and couldn't be missed.  She asked about the medication discussed during our previous call (Crestor). I told her again that the Pravastatin wasn't working and Dr.Simpson wanted her to switch to Crestor 40mg  qd. She stated she would have to look into that and call the pharmacy about prices and asked if I had sent it in for a 30 day supply and I told her yes, as was her request.

## 2017-10-15 NOTE — Telephone Encounter (Signed)
She needs a cXR and to submit sputum for culture to see what organism she is growing . I have ordered the cXR and request she get it by [facility], she may go today. Pls order sputum c/s,  And needs to get this either thios pm ornext week at [facility] where [facility], pls explain

## 2017-10-15 NOTE — Telephone Encounter (Signed)
Lab ordered.

## 2017-10-15 NOTE — Telephone Encounter (Signed)
Gave patient lab results. She stated she still has bronchitis even after taking 2 rounds of antibiotics. I told her I would forward Dr.Simpson a message letting her know.

## 2017-10-15 NOTE — Telephone Encounter (Signed)
Left message to call back  

## 2017-10-15 NOTE — Telephone Encounter (Signed)
Thank you, will hear more from her as time goes on

## 2017-10-15 NOTE — Progress Notes (Signed)
Dg cxr  

## 2017-10-20 ENCOUNTER — Ambulatory Visit (HOSPITAL_COMMUNITY)
Admission: RE | Admit: 2017-10-20 | Discharge: 2017-10-20 | Disposition: A | Discharge status: Home / Self Care | Payer: Medicare Other | Source: Ambulatory Visit | Attending: Family Medicine | Admitting: Family Medicine

## 2017-10-20 ENCOUNTER — Other Ambulatory Visit: Payer: Self-pay | Admitting: Family Medicine

## 2017-10-20 ENCOUNTER — Telehealth: Payer: Self-pay

## 2017-10-20 DIAGNOSIS — R05 Cough: Secondary | ICD-10-CM | POA: Diagnosis not present

## 2017-10-20 DIAGNOSIS — J40 Bronchitis, not specified as acute or chronic: Secondary | ICD-10-CM | POA: Insufficient documentation

## 2017-10-20 DIAGNOSIS — I700 Atherosclerosis of aorta: Secondary | ICD-10-CM | POA: Insufficient documentation

## 2017-10-20 MED ORDER — LISINOPRIL 30 MG PO TABS: 30.0000 mg | ORAL_TABLET | Freq: Every day | ORAL | 3 refills | Status: DC

## 2017-10-20 NOTE — Telephone Encounter (Signed)
Pt is calling regarding her Lisinoprol-----[facility] ---   The RX is written per the bottle for 1 a day   However Dr Moshe Cipro advised patient to take  1 1/2 a day ---so the pt has run out of her RX--and needs this filled today -

## 2017-10-20 NOTE — Telephone Encounter (Signed)
I don't see anywhere in the chart where she was advised to take 1 1/2 tab. She has been and is currently out of the medication. Wants it sent in for the new dose. Please advise

## 2017-10-21 NOTE — Telephone Encounter (Signed)
I spoke with the patient this is sorted out

## 2017-10-22 ENCOUNTER — Other Ambulatory Visit: Payer: Self-pay | Admitting: Family Medicine

## 2017-10-22 ENCOUNTER — Other Ambulatory Visit: Payer: Self-pay

## 2017-10-22 DIAGNOSIS — J329 Chronic sinusitis, unspecified: Secondary | ICD-10-CM

## 2017-10-22 LAB — RESPIRATORY CULTURE OR RESPIRATORY AND SPUTUM CULTURE
MICRO NUMBER:: 90704944
RESULT: NORMAL
SPECIMEN QUALITY:: ADEQUATE

## 2017-10-22 MED ORDER — FLUTICASONE PROPIONATE 50 MCG/ACT NA SUSP: 1.0000 | Freq: Every day | NASAL | 6 refills | Status: DC

## 2017-10-26 ENCOUNTER — Ambulatory Visit (HOSPITAL_COMMUNITY)
Admission: RE | Admit: 2017-10-26 | Discharge: 2017-10-26 | Disposition: A | Discharge status: Home / Self Care | Payer: Medicare Other | Source: Ambulatory Visit | Attending: Family Medicine | Admitting: Family Medicine

## 2017-10-26 ENCOUNTER — Other Ambulatory Visit: Payer: Self-pay | Admitting: Family Medicine

## 2017-10-26 DIAGNOSIS — J329 Chronic sinusitis, unspecified: Secondary | ICD-10-CM | POA: Insufficient documentation

## 2017-10-26 MED ORDER — ERYTHROMYCIN BASE 500 MG PO TABS: 500.0000 mg | ORAL_TABLET | Freq: Three times a day (TID) | ORAL | 0 refills | Status: DC

## 2017-10-27 ENCOUNTER — Other Ambulatory Visit: Payer: Self-pay | Admitting: Family Medicine

## 2017-10-27 ENCOUNTER — Telehealth: Payer: Self-pay | Admitting: Family Medicine

## 2017-10-27 MED ORDER — CLINDAMYCIN HCL 300 MG PO CAPS: 300.0000 mg | ORAL_CAPSULE | Freq: Three times a day (TID) | ORAL | 0 refills | Status: DC

## 2017-10-27 NOTE — Telephone Encounter (Signed)
Caryl Pina from [place] left message that patients erythromycin prescription is $300 she is requesting to switch to something else. Cb#: 336/ [phone]

## 2017-10-27 NOTE — Telephone Encounter (Signed)
Pls call pt , tell her I have prescribed cleocin instead, I hope this is more affordable

## 2017-10-27 NOTE — Telephone Encounter (Signed)
Patient made aware of new prescription called in.

## 2017-10-27 NOTE — Telephone Encounter (Signed)
Pt is calling cant afford the medicine--please call her in something else

## 2017-11-02 ENCOUNTER — Other Ambulatory Visit: Payer: Self-pay | Admitting: Family Medicine

## 2017-11-23 ENCOUNTER — Telehealth: Payer: Self-pay | Admitting: Family Medicine

## 2017-11-23 NOTE — Telephone Encounter (Signed)
Forwarding to Dr.Simpson

## 2017-11-23 NOTE — Telephone Encounter (Signed)
Patient is requesting refill for erythromycin base (E-MYCIN) 500 MG tablet [[id number]]    [facility] is her pharmacy.

## 2017-11-24 ENCOUNTER — Other Ambulatory Visit: Payer: Self-pay | Admitting: Family Medicine

## 2017-11-24 DIAGNOSIS — J320 Chronic maxillary sinusitis: Secondary | ICD-10-CM

## 2017-11-24 MED ORDER — CLINDAMYCIN HCL 300 MG PO CAPS: 300.0000 mg | ORAL_CAPSULE | Freq: Three times a day (TID) | ORAL | 0 refills | Status: DC

## 2017-11-24 NOTE — Telephone Encounter (Signed)
Pt still c/o sinus drainage though improving, still green states she feels sick all the time since March when she came in contact with someone who was just recovering from pneumonia I have sent an additional 2 weeks of cleocin, she is to have rept sinus X ray the last week of August and is aware of both of these plans

## 2017-11-24 NOTE — Progress Notes (Signed)
cleocin

## 2017-11-30 ENCOUNTER — Other Ambulatory Visit: Payer: Self-pay | Admitting: Family Medicine

## 2017-12-17 ENCOUNTER — Other Ambulatory Visit: Payer: Self-pay | Admitting: Family Medicine

## 2017-12-23 ENCOUNTER — Encounter: Payer: Self-pay | Admitting: Family Medicine

## 2017-12-23 ENCOUNTER — Ambulatory Visit (INDEPENDENT_AMBULATORY_CARE_PROVIDER_SITE_OTHER): Payer: Medicare Other | Admitting: Family Medicine

## 2017-12-23 VITALS — BP 134/86 | HR 80 | Resp 16 | Ht 66.0 in | Wt 165.0 lb

## 2017-12-23 DIAGNOSIS — I10 Essential (primary) hypertension: Secondary | ICD-10-CM | POA: Diagnosis not present

## 2017-12-23 DIAGNOSIS — E559 Vitamin D deficiency, unspecified: Secondary | ICD-10-CM

## 2017-12-23 DIAGNOSIS — Z23 Encounter for immunization: Secondary | ICD-10-CM

## 2017-12-23 DIAGNOSIS — G43109 Migraine with aura, not intractable, without status migrainosus: Secondary | ICD-10-CM | POA: Diagnosis not present

## 2017-12-23 DIAGNOSIS — G8929 Other chronic pain: Secondary | ICD-10-CM

## 2017-12-23 DIAGNOSIS — M549 Dorsalgia, unspecified: Secondary | ICD-10-CM

## 2017-12-23 DIAGNOSIS — E785 Hyperlipidemia, unspecified: Secondary | ICD-10-CM

## 2017-12-23 MED ORDER — METHYLPREDNISOLONE ACETATE 80 MG/ML IJ SUSP
80.0000 mg | Freq: Once | INTRAMUSCULAR | Status: AC
  Administered 2017-12-23: 80 mg via INTRAMUSCULAR

## 2017-12-23 MED ORDER — KETOROLAC TROMETHAMINE 60 MG/2ML IM SOLN
60.0000 mg | Freq: Once | INTRAMUSCULAR | Status: AC
  Administered 2017-12-23: 60 mg via INTRAMUSCULAR

## 2017-12-23 MED ORDER — PREDNISONE 5 MG (21) PO TBPK: 5.0000 mg | ORAL_TABLET | ORAL | 0 refills | Status: DC

## 2017-12-23 MED ORDER — HYDROCODONE-ACETAMINOPHEN 5-325 MG PO TABS: ORAL_TABLET | ORAL | 0 refills | Status: AC

## 2017-12-23 NOTE — Progress Notes (Signed)
Fill Date ID Written Drug Qty Days Prescriber Rx # Pharmacy Refill Daily Dose * Pymt Type PMP  08/23/2017  2  08/23/2017  Hydrocodone-Acetamin 5-325 Mg  40.00 40 Ma Sim  [id number]  [place] ([id number])  1/1 5.00 MME Comm Ins  [place]  04/28/2017  2  04/26/2017  Hydrocodone-Acetamin 5-325 Mg  40.00 40 Ma Sim  [id number]  [place] ([id number])  1/1 5.00 MME Comm Ins  [place]  12/30/2016  1  12/28/2016  Hydrocodone-Acetamin 5-325 Mg  40.00 90 Ma Sim  [id number]  [place] ([id number])  1/1 2.22 MME Comm Ins  [place]  09/17/2016  1  09/10/2016  Hydrocodone-Acetamin 5-325 Mg  40.00 40 Ma Sim  [id number]  [place] ([id number])  1/1 5.00 MME Comm Ins  [place]  06/23/2016  1  06/22/2016  Hydrocodone-Acetamin 5-325 Mg  30.00 30 Ma Sim  [id number]  [place] ([id number])  1/1 5.00 MME Comm Ins  [place]  03/31/2016  1  03/20/2016  Hydrocodone-Acetamin 5-325 Mg  30.00 30 Ma Sim  [id number]  [place] ([id number])  1/1 5.00 MME Comm Ins  [place]  02/29/2016  1  02/21/2016  Hydrocodone-Acetamin 5-325 Mg  30.00 30 Ma Sim  [id number]  [place] ([id number])  1/1 5.00 MME Comm Ins  [place]  01/30/2016  1  01/10/2016  Hydrocodone-Acetamin 5-325 Mg  30.00 [place]  [id number]  [facility] ([id number])  1/1

## 2017-12-23 NOTE — Patient Instructions (Addendum)
Medicare wellness with nurse, past due please schedule.May schedule  same day as next MD visit in 11 to  12 weeks for pain management  And pap  Toradol  And depo medrol IM to day of back pain and 6 day course of pred nisone is prescribed also  Flu  Vaccine today  CBC, fasting lipid cmp and EGFR, CBC, Tsh and vit D Nov 1 or after, please collect before leaving   Keep active, and a lot of freuit and vegetable , also water is he drink

## 2017-12-23 NOTE — Progress Notes (Signed)
   Sara Woods     MRN: [id number]      DOB: 1945-12-09   HPI Sara Woods is here for follow up and re-evaluation of chronic medical conditions, medication management  Esp chronic pain managment and review of any available recent lab and radiology data.  Preventive health is updated, specifically  Cancer screening and Immunization.   Questions or concerns regarding consultations or procedures which the PT has had in the interim are  addressed. The PT denies any adverse reactions to current medications since the last visit.  C/o increased back pain requests anti inflammatory injection followed by short course of anti inflammatory, reports this to be useful Recently lost her nephew but is coping fairly well ROS Denies recent fever or chills. Denies sinus pressure, nasal congestion, ear pain or sore throat. Denies chest congestion, productive cough or wheezing. Denies chest pains, palpitations and leg swelling Denies abdominal pain, nausea, vomiting,diarrhea or constipation.   Denies dysuria, frequency, hesitancy or incontinence. Denies headaches, seizures, numbness, or tingling. Denies depression, anxiety or insomnia. Denies skin break down or rash.   PE  BP 134/86   Pulse 80   Resp 16   Ht 5\' 6"  (1.676 m)   Wt 165 lb (74.8 kg)   SpO2 97%   BMI 26.63 kg/m   Patient alert and oriented and in no cardiopulmonary distress.  HEENT: No facial asymmetry, EOMI,   oropharynx pink and moist.  Neck supple no JVD, no mass.  Chest: Clear to auscultation bilaterally.  CVS: S1, S2 no murmurs, no S3.Regular rate.  ABD: Soft non tender.   Ext: No edema  MS: Decreased though adequate Adequate ROM spine, normal in shoulders, hips and knees.  Skin: Intact, no ulcerations or rash noted.  Psych: Good eye contact, normal affect. Memory intact not anxious or depressed appearing.  CNS: CN 2-12 intact, power,  normal throughout.no focal deficits noted.   Assessment & Plan  Back pain with  radiation Uncontrolled.Toradol and depo medrol administered IM in the office , to be followed by a short course of oral prednisone and NSAIDS.   Encounter for chronic pain management The patient's Controlled Substance registry is reviewed and compliance confirmed. Adequacy of  Pain control and level of function is assessed. Medication dosing is adjusted as deemed appropriate. Twelve weeks of medication is prescribed , patient signs for the script and is provided with a follow up appointment between 11 to 12 weeks .   Essential hypertension Controlled, no change in medication DASH diet and commitment to daily physical activity for a minimum of 30 minutes discussed and encouraged, as a part of hypertension management. The importance of attaining a healthy weight is also discussed.  BP/Weight 12/23/2017 08/23/2017 04/26/2017 01/06/2017 12/28/2016 09/10/2016 4/69/6295  Systolic BP 284 132 440 102 725 366 440  Diastolic BP 86 82 90 78 82 84 84  Wt. (Lbs) 165 169 169 173 172.2 176 178  BMI 26.63 27.28 27.28 27.92 27.79 28.41 28.73       Hyperlipidemia LDL goal <100 Hyperlipidemia:Low fat diet discussed and encouraged.   Lipid Panel  Lab Results  Component Value Date   CHOL 214 (H) 09/30/2017   HDL 52 09/30/2017   LDLCALC 130 (H) 09/30/2017   TRIG 188 (H) 09/30/2017   CHOLHDL 4.1 09/30/2017   uncontrolled , modification of diet and med adherence recommended    Migraine with aura Controlled, no change in medication

## 2017-12-23 NOTE — Assessment & Plan Note (Signed)
Uncontrolled.Toradol and depo medrol administered IM in the office , to be followed by a short course of oral prednisone and NSAIDS.  

## 2017-12-27 ENCOUNTER — Encounter: Payer: Self-pay | Admitting: Family Medicine

## 2017-12-27 NOTE — Assessment & Plan Note (Signed)
Controlled, no change in medication  

## 2017-12-27 NOTE — Assessment & Plan Note (Signed)
The patient's Controlled Substance registry is reviewed and compliance confirmed. Adequacy of  Pain control and level of function is assessed. Medication dosing is adjusted as deemed appropriate. Twelve weeks of medication is prescribed , patient signs for the script and is provided with a follow up appointment between 11 to 12 weeks .  

## 2017-12-27 NOTE — Assessment & Plan Note (Signed)
Hyperlipidemia:Low fat diet discussed and encouraged.   Lipid Panel  Lab Results  Component Value Date   CHOL 214 (H) 09/30/2017   HDL 52 09/30/2017   LDLCALC 130 (H) 09/30/2017   TRIG 188 (H) 09/30/2017   CHOLHDL 4.1 09/30/2017   uncontrolled , modification of diet and med adherence recommended

## 2017-12-27 NOTE — Assessment & Plan Note (Signed)
Controlled, no change in medication DASH diet and commitment to daily physical activity for a minimum of 30 minutes discussed and encouraged, as a part of hypertension management. The importance of attaining a healthy weight is also discussed.  BP/Weight 12/23/2017 08/23/2017 04/26/2017 01/06/2017 12/28/2016 09/10/2016 4/78/4128  Systolic BP 208 138 871 959 747 185 501  Diastolic BP 86 82 90 78 82 84 84  Wt. (Lbs) 165 169 169 173 172.2 176 178  BMI 26.63 27.28 27.28 27.92 27.79 28.41 28.73

## 2018-01-01 ENCOUNTER — Other Ambulatory Visit: Payer: Self-pay | Admitting: Family Medicine

## 2018-01-28 DIAGNOSIS — E785 Hyperlipidemia, unspecified: Secondary | ICD-10-CM | POA: Diagnosis not present

## 2018-01-28 DIAGNOSIS — E559 Vitamin D deficiency, unspecified: Secondary | ICD-10-CM | POA: Diagnosis not present

## 2018-01-28 DIAGNOSIS — I10 Essential (primary) hypertension: Secondary | ICD-10-CM | POA: Diagnosis not present

## 2018-01-29 ENCOUNTER — Encounter: Payer: Self-pay | Admitting: Family Medicine

## 2018-01-29 LAB — CBC
HEMATOCRIT: 40.6 % (ref 35.0–45.0)
HEMOGLOBIN: 13.4 g/dL (ref 11.7–15.5)
MCH: 30.7 pg (ref 27.0–33.0)
MCHC: 33 g/dL (ref 32.0–36.0)
MCV: 92.9 fL (ref 80.0–100.0)
MPV: 11.2 fL (ref 7.5–12.5)
Platelets: 376 10*3/uL (ref 140–400)
RBC: 4.37 10*6/uL (ref 3.80–5.10)
RDW: 12.1 % (ref 11.0–15.0)
WBC: 7.8 10*3/uL (ref 3.8–10.8)

## 2018-01-29 LAB — COMPLETE METABOLIC PANEL WITH GFR
AG Ratio: 1.8 (calc) (ref 1.0–2.5)
ALBUMIN MSPROF: 4.2 g/dL (ref 3.6–5.1)
ALT: 28 U/L (ref 6–29)
AST: 24 U/L (ref 10–35)
Alkaline phosphatase (APISO): 79 U/L (ref 33–130)
BUN / CREAT RATIO: 24 (calc) — AB (ref 6–22)
BUN: 23 mg/dL (ref 7–25)
CO2: 28 mmol/L (ref 20–32)
CREATININE: 0.95 mg/dL — AB (ref 0.60–0.93)
Calcium: 10 mg/dL (ref 8.6–10.4)
Chloride: 111 mmol/L — ABNORMAL HIGH (ref 98–110)
GFR, EST AFRICAN AMERICAN: 69 mL/min/{1.73_m2} (ref >=60)
GFR, EST NON AFRICAN AMERICAN: 60 mL/min/{1.73_m2} (ref >=60)
GLOBULIN: 2.4 g/dL (ref 1.9–3.7)
GLUCOSE: 77 mg/dL (ref 65–99)
Potassium: 4.1 mmol/L (ref 3.5–5.3)
Sodium: 143 mmol/L (ref 135–146)
TOTAL PROTEIN: 6.6 g/dL (ref 6.1–8.1)
Total Bilirubin: 0.4 mg/dL (ref 0.2–1.2)

## 2018-01-29 LAB — LIPID PANEL
CHOLESTEROL: 140 mg/dL (ref <200)
HDL: 52 mg/dL (ref >50)
LDL Cholesterol (Calc): 66 mg/dL (calc)
Non-HDL Cholesterol (Calc): 88 mg/dL (calc) (ref <130)
Total CHOL/HDL Ratio: 2.7 (calc) (ref <5.0)
Triglycerides: 135 mg/dL (ref <150)

## 2018-01-29 LAB — TSH: TSH: 1.77 m[IU]/L (ref 0.40–4.50)

## 2018-01-29 LAB — VITAMIN D 25 HYDROXY (VIT D DEFICIENCY, FRACTURES): VIT D 25 HYDROXY: 31 ng/mL (ref 30–100)

## 2018-02-01 ENCOUNTER — Other Ambulatory Visit: Payer: Self-pay | Admitting: Family Medicine

## 2018-03-02 ENCOUNTER — Other Ambulatory Visit: Payer: Self-pay | Admitting: Family Medicine

## 2018-03-17 ENCOUNTER — Telehealth: Payer: Self-pay | Admitting: *Deleted

## 2018-03-17 NOTE — Telephone Encounter (Signed)
Called patient and she was very rude to me when I asked her about the injection she was talking about. Told me that I should "just ask Velna Hatchet about it!" , so forwarding this one to you.

## 2018-03-17 NOTE — Telephone Encounter (Signed)
Pt is coming in on 11-12 at 1pm Wants to know if she can have back injections at the same appt?

## 2018-03-18 NOTE — Telephone Encounter (Signed)
Wanted to confirm if she could before calling because I did not want to tell her wrong

## 2018-03-18 NOTE — Telephone Encounter (Signed)
Yews she may

## 2018-03-18 NOTE — Telephone Encounter (Signed)
Patient aware.

## 2018-03-22 ENCOUNTER — Ambulatory Visit (INDEPENDENT_AMBULATORY_CARE_PROVIDER_SITE_OTHER): Payer: Medicare Other

## 2018-03-22 ENCOUNTER — Other Ambulatory Visit: Payer: Self-pay | Admitting: Family Medicine

## 2018-03-22 VITALS — BP 149/85 | HR 77 | Resp 12 | Ht 65.0 in | Wt 165.0 lb

## 2018-03-22 DIAGNOSIS — M549 Dorsalgia, unspecified: Secondary | ICD-10-CM

## 2018-03-22 DIAGNOSIS — Z0000 Encounter for general adult medical examination without abnormal findings: Secondary | ICD-10-CM

## 2018-03-22 MED ORDER — DOXYCYCLINE HYCLATE 100 MG PO TABS: 100.0000 mg | ORAL_TABLET | Freq: Two times a day (BID) | ORAL | 0 refills | Status: DC

## 2018-03-22 MED ORDER — METHYLPREDNISOLONE ACETATE 80 MG/ML IJ SUSP
80.0000 mg | Freq: Once | INTRAMUSCULAR | Status: AC
  Administered 2018-03-22: 80 mg via INTRAMUSCULAR

## 2018-03-22 MED ORDER — KETOROLAC TROMETHAMINE 60 MG/2ML IM SOLN
60.0000 mg | Freq: Once | INTRAMUSCULAR | Status: AC
  Administered 2018-03-22: 60 mg via INTRAMUSCULAR

## 2018-03-22 NOTE — Patient Instructions (Signed)
Sara Woods , Thank you for taking time to come for your Medicare Wellness Visit. I appreciate your ongoing commitment to your health goals. Please review the following plan we discussed and let me know if I can assist you in the future.   Screening recommendations/referrals: Colonoscopy: up to date  Mammogram: up to date  Bone Density: up to date  Recommended yearly ophthalmology/optometry visit for glaucoma screening and checkup Recommended yearly dental visit for hygiene and checkup  Vaccinations: Influenza vaccine: up to date  Pneumococcal vaccine: up to date  Tdap vaccine: up to date  Shingles vaccine: up to date     Advanced directives: information provided   Conditions/risks identified: hypertension, arthritis   Next appointment: wellness visit in one year    Preventive Care 72 Years and Older, Female Preventive care refers to lifestyle choices and visits with your health care provider that can promote health and wellness. What does preventive care include?  A yearly physical exam. This is also called an annual well check.  Dental exams once or twice a year.  Routine eye exams. Ask your health care provider how often you should have your eyes checked.  Personal lifestyle choices, including:  Daily care of your teeth and gums.  Regular physical activity.  Eating a healthy diet.  Avoiding tobacco and drug use.  Limiting alcohol use.  Practicing safe sex.  Taking low-dose aspirin every day.  Taking vitamin and mineral supplements as recommended by your health care provider. What happens during an annual well check? The services and screenings done by your health care provider during your annual well check will depend on your age, overall health, lifestyle risk factors, and family history of disease. Counseling  Your health care provider may ask you questions about your:  Alcohol use.  Tobacco use.  Drug use.  Emotional well-being.  Home and  relationship well-being.  Sexual activity.  Eating habits.  History of falls.  Memory and ability to understand (cognition).  Work and work Statistician.  Reproductive health. Screening  You may have the following tests or measurements:  Height, weight, and BMI.  Blood pressure.  Lipid and cholesterol levels. These may be checked every 5 years, or more frequently if you are over 72 years old.  Skin check.  Lung cancer screening. You may have this screening every year starting at age 23 if you have a 30-pack-year history of smoking and currently smoke or have quit within the past 15 years.  Fecal occult blood test (FOBT) of the stool. You may have this test every year starting at age 11.  Flexible sigmoidoscopy or colonoscopy. You may have a sigmoidoscopy every 5 years or a colonoscopy every 10 years starting at age 59.  Hepatitis C blood test.  Hepatitis B blood test.  Sexually transmitted disease (STD) testing.  Diabetes screening. This is done by checking your blood sugar (glucose) after you have not eaten for a while (fasting). You may have this done every 1-3 years.  Bone density scan. This is done to screen for osteoporosis. You may have this done starting at age 18.  Mammogram. This may be done every 1-2 years. Talk to your health care provider about how often you should have regular mammograms. Talk with your health care provider about your test results, treatment options, and if necessary, the need for more tests. Vaccines  Your health care provider may recommend certain vaccines, such as:  Influenza vaccine. This is recommended every year.  Tetanus, diphtheria, and acellular  pertussis (Tdap, Td) vaccine. You may need a Td booster every 10 years.  Zoster vaccine. You may need this after age 7.  Pneumococcal 13-valent conjugate (PCV13) vaccine. One dose is recommended after age 62.  Pneumococcal polysaccharide (PPSV23) vaccine. One dose is recommended after  age 69. Talk to your health care provider about which screenings and vaccines you need and how often you need them. This information is not intended to replace advice given to you by your health care provider. Make sure you discuss any questions you have with your health care provider. Document Released: 05/24/2015 Document Revised: 01/15/2016 Document Reviewed: 02/26/2015 Elsevier Interactive Patient Education  2017 [place] Prevention in the Home Falls can cause injuries. They can happen to people of all ages. There are many things you can do to make your home safe and to help prevent falls. What can I do on the outside of my home?  Regularly fix the edges of walkways and driveways and fix any cracks.  Remove anything that might make you trip as you walk through a door, such as a raised step or threshold.  Trim any bushes or trees on the path to your home.  Use bright outdoor lighting.  Clear any walking paths of anything that might make someone trip, such as rocks or tools.  Regularly check to see if handrails are loose or broken. Make sure that both sides of any steps have handrails.  Any raised decks and porches should have guardrails on the edges.  Have any leaves, snow, or ice cleared regularly.  Use sand or salt on walking paths during winter.  Clean up any spills in your garage right away. This includes oil or grease spills. What can I do in the bathroom?  Use night lights.  Install grab bars by the toilet and in the tub and shower. Do not use towel bars as grab bars.  Use non-skid mats or decals in the tub or shower.  If you need to sit down in the shower, use a plastic, non-slip stool.  Keep the floor dry. Clean up any water that spills on the floor as soon as it happens.  Remove soap buildup in the tub or shower regularly.  Attach bath mats securely with double-sided non-slip rug tape.  Do not have throw rugs and other things on the floor that can  make you trip. What can I do in the bedroom?  Use night lights.  Make sure that you have a light by your bed that is easy to reach.  Do not use any sheets or blankets that are too big for your bed. They should not hang down onto the floor.  Have a firm chair that has side arms. You can use this for support while you get dressed.  Do not have throw rugs and other things on the floor that can make you trip. What can I do in the kitchen?  Clean up any spills right away.  Avoid walking on wet floors.  Keep items that you use a lot in easy-to-reach places.  If you need to reach something above you, use a strong step stool that has a grab bar.  Keep electrical cords out of the way.  Do not use floor polish or wax that makes floors slippery. If you must use wax, use non-skid floor wax.  Do not have throw rugs and other things on the floor that can make you trip. What can I do with my stairs?  Do not leave any items on the stairs.  Make sure that there are handrails on both sides of the stairs and use them. Fix handrails that are broken or loose. Make sure that handrails are as long as the stairways.  Check any carpeting to make sure that it is firmly attached to the stairs. Fix any carpet that is loose or worn.  Avoid having throw rugs at the top or bottom of the stairs. If you do have throw rugs, attach them to the floor with carpet tape.  Make sure that you have a light switch at the top of the stairs and the bottom of the stairs. If you do not have them, ask someone to add them for you. What else can I do to help prevent falls?  Wear shoes that:  Do not have high heels.  Have rubber bottoms.  Are comfortable and fit you well.  Are closed at the toe. Do not wear sandals.  If you use a stepladder:  Make sure that it is fully opened. Do not climb a closed stepladder.  Make sure that both sides of the stepladder are locked into place.  Ask someone to hold it for you,  if possible.  Clearly mark and make sure that you can see:  Any grab bars or handrails.  First and last steps.  Where the edge of each step is.  Use tools that help you move around (mobility aids) if they are needed. These include:  Canes.  Walkers.  Scooters.  Crutches.  Turn on the lights when you go into a dark area. Replace any light bulbs as soon as they burn out.  Set up your furniture so you have a clear path. Avoid moving your furniture around.  If any of your floors are uneven, fix them.  If there are any pets around you, be aware of where they are.  Review your medicines with your doctor. Some medicines can make you feel dizzy. This can increase your chance of falling. Ask your doctor what other things that you can do to help prevent falls. This information is not intended to replace advice given to you by your health care provider. Make sure you discuss any questions you have with your health care provider. Document Released: 02/21/2009 Document Revised: 10/03/2015 Document Reviewed: 06/01/2014 Elsevier Interactive Patient Education  2017 [employer].

## 2018-03-22 NOTE — Telephone Encounter (Signed)
toradol 60 and depo medrol 80 given at wellness per MD for flare of back pain with the weather change and also c/o green sinus drainage x 3 weeks and pressure. States its the same sinus infection she gets every year and she can't get rid of it. Please advise

## 2018-03-22 NOTE — Telephone Encounter (Signed)
Patient aware.

## 2018-03-22 NOTE — Progress Notes (Signed)
toradol and depo medrol injections given per MD for back pain flare x 1 week

## 2018-03-22 NOTE — Progress Notes (Signed)
Doxycycline prescribed for sinus infectio

## 2018-03-22 NOTE — Telephone Encounter (Signed)
I have prescribed a 10 day course of doxycycline and this is sent to [facility], pls let her kn ow, and if her symptoms do not improve, and her infection clear up, she will need to schedule an office visit with me

## 2018-03-22 NOTE — Progress Notes (Signed)
Subjective:   Sara Woods is a 72 y.o. female who presents for Medicare Annual (Subsequent) preventive examination.  Review of Systems:   Cardiac Risk Factors include: hypertension;dyslipidemia;advanced age (>47men, >79 women)     Objective:     Vitals: BP (!) 149/85   Pulse 77   Resp 12   Wt 165 lb (74.8 kg)   SpO2 98%   BMI 26.63 kg/m   Body mass index is 26.63 kg/m.  Advanced Directives 03/22/2018 01/06/2017  Does Patient Have a Medical Advance Directive? Yes;No No  Does patient want to make changes to medical advance directive? No - Patient declined -  Would patient like information on creating a medical advance directive? Yes (ED - Information included in AVS) No - Patient declined    Tobacco Social History   Tobacco Use  Smoking Status Never Smoker  Smokeless Tobacco Never Used     Counseling given: Not Answered   Clinical Intake:  Pre-visit preparation completed: Yes  Pain : No/denies pain Pain Score: 0-No pain     BMI - recorded: 26.6 Nutritional Status: BMI 25 -29 Overweight Nutritional Risks: None Diabetes: No  How often do you need to have someone help you when you read instructions, pamphlets, or other written materials from your doctor or pharmacy?: 1 - Never What is the last grade level you completed in school?: 12 grade   Interpreter Needed?: No  Information entered by :: Cammy Copa LPN   Past Medical History:  Diagnosis Date  . Allergic rhinitis   . Anxiety   . Arthritis   . Depression approx 1983  . History of migraines   . Hyperlipidemia approx 2006  . Hypertension approx 2000  . MRSA infection    Recurrent abcesses  . Recurrent miscarriages due to luteal phase defect, not pregnant    5 miscarriages with 1 living adult son   Past Surgical History:  Procedure Laterality Date  . BREAST EXCISIONAL BIOPSY Left   . SKIN CANCER DESTRUCTION     Back of left leg, right shoulder  . TONSILLECTOMY     Childhood  . TUBAL  LIGATION    . TUMOR EXCISION     Tongue   Family History  Problem Relation Age of Onset  . Prostate cancer Father        metastic, 04/2006  . Obesity Sister   . Fibromyalgia Sister   . Osteoporosis Mother   . Cerebrovascular Disease Mother   . Brain cancer Mother   . Cerebrovascular Disease Brother   . Stroke Son        x3, Vertebral artery dissection, January 2009  . Hypertension Son    Social History   Socioeconomic History  . Marital status: Married    Spouse name: Not on file  . Number of children: 1  . Years of education: 12+  . Highest education level: 12th grade  Occupational History  . Occupation: retired   Scientific laboratory technician  . Financial resource strain: Not hard at all  . Food insecurity:    Worry: Never true    Inability: Never true  . Transportation needs:    Medical: No    Non-medical: No  Tobacco Use  . Smoking status: Never Smoker  . Smokeless tobacco: Never Used  Substance and Sexual Activity  . Alcohol use: No  . Drug use: No  . Sexual activity: Not Currently    Birth control/protection: Post-menopausal  Lifestyle  . Physical activity:    Days per  week: 2 days    Minutes per session: 30 min  . Stress: Only a little  Relationships  . Social connections:    Talks on phone: Three times a week    Gets together: Never    Attends religious service: Never    Active member of club or organization: No    Attends meetings of clubs or organizations: Never    Relationship status: Married  Other Topics Concern  . Not on file  Social History Narrative   Married, lives with spouse   Spouse admitted for lumbar surgery          Outpatient Encounter Medications as of 03/22/2018  Medication Sig  . alendronate (FOSAMAX) 70 MG tablet TAKE ONE TABLET ONCE A WEEK IN THE MORNING WITH A FULL GLASS OF WATER ON AN EMPTY STOMACH. DO NOT LAY DOWN FOR 30 MINUTES.  . Calcium-Vitamin D (CALTRATE 600 PLUS-VIT D PO) Take 2 tablets by mouth daily.   . clindamycin  (CLEOCIN) 300 MG capsule Take 1 capsule (300 mg total) by mouth 3 (three) times daily.  . diclofenac (VOLTAREN) 50 MG EC tablet TAKE 1 TABLET BY MOUTH TWICE DAILY  . doxepin (SINEQUAN) 10 MG capsule TAKE TWO CAPSULES BY MOUTH AT BEDTIME  . fluticasone (FLONASE) 50 MCG/ACT nasal spray Place 1 spray into both nostrils daily.  Marland Kitchen gabapentin (NEURONTIN) 300 MG capsule TAKE ONE CAPSULE BY MOUTH AT BEDTIME  . HYDROcodone-acetaminophen (NORCO/VICODIN) 5-325 MG tablet One tablet once  Daily as needed, for uncontrolled back pain  . KRILL OIL 1000 MG CAPS Take 1 capsule by mouth daily.   Marland Kitchen lisinopril (PRINIVIL,ZESTRIL) 30 MG tablet Take 1 tablet (30 mg total) by mouth daily.  . meclizine (ANTIVERT) 25 MG tablet Take 1 tablet (25 mg total) by mouth 3 (three) times daily as needed for dizziness.  . multivitamin (THERAGRAN) per tablet Take 1 tablet by mouth daily.   Marland Kitchen omeprazole (PRILOSEC OTC) 20 MG tablet Take 20 mg by mouth daily.  . predniSONE (STERAPRED UNI-PAK 21 TAB) 5 MG (21) TBPK tablet Take 1 tablet (5 mg total) by mouth as directed. Use as directed  . promethazine-dextromethorphan (PROMETHAZINE-DM) 6.25-15 MG/5ML syrup One teaspoon at bedtime as needed, for excessive cough  . rosuvastatin (CRESTOR) 40 MG tablet Take 1 tablet (40 mg total) by mouth daily.  Marland Kitchen topiramate (TOPAMAX) 50 MG tablet TAKE 1 TABLET BY MOUTH TWICE DAILY   No facility-administered encounter medications on file as of 03/22/2018.     Activities of Daily Living In your present state of health, do you have any difficulty performing the following activities: 03/22/2018  Hearing? N  Vision? N  Difficulty concentrating or making decisions? N  Walking or climbing stairs? N  Dressing or bathing? N  Doing errands, shopping? N  Preparing Food and eating ? N  Using the Toilet? N  In the past six months, have you accidently leaked urine? N  Do you have problems with loss of bowel control? N  Managing your Medications? N  Managing  your Finances? N  Housekeeping or managing your Housekeeping? N  Some recent data might be hidden    Patient Care Team: Fayrene Helper, MD as PCP - Danella Sensing, MD as Referring Physician (Unknown Physician Specialty)    Assessment:   This is a routine wellness examination for Shereece.  Exercise Activities and Dietary recommendations Current Exercise Habits: Home exercise routine, Type of exercise: walking, Time (Minutes): 20, Frequency (Times/Week): 2, Weekly Exercise (Minutes/Week): 40, Intensity:  Mild, Exercise limited by: orthopedic condition(s);cardiac condition(s)  Goals    . DIET - EAT MORE FRUITS AND VEGETABLES    . Exercise 3x per week (30 min per time)     Recommend starting a routine exercise program at least 3 days a week for 30-45 minutes at a time as tolerated.         Fall Risk Fall Risk  03/22/2018 12/23/2017 01/06/2017 12/28/2016 09/10/2016  Falls in the past year? 0 No No No No  Number falls in past yr: - - - - -  Risk Factor Category  - - - - -  Risk for fall due to : - - - - -   Is the patient's home free of loose throw rugs in walkways, pet beds, electrical cords, etc?   yes      Grab bars in the bathroom? yes      Handrails on the stairs?   yes      Adequate lighting?   yes  Timed Get Up and Go performed: Patient able to perform in 5 seconds without assistance   Depression Screen PHQ 2/9 Scores 03/22/2018 12/23/2017 12/23/2017 01/06/2017  PHQ - 2 Score 0 0 0 0  PHQ- 9 Score - - - 0     Cognitive Function     6CIT Screen 03/22/2018 01/06/2017  What Year? 0 points 0 points  What month? 0 points 0 points  What time? 0 points 0 points  Count back from 20 0 points 0 points  Months in reverse 0 points 0 points  Repeat phrase 0 points 0 points  Total Score 0 0    Immunization History  Administered Date(s) Administered  . H1N1 04/19/2008  . Influenza Split 01/30/2011, 02/24/2012  . Influenza Whole 02/23/2007, 02/23/2008  .  Influenza,inj,Quad PF,6+ Mos 02/27/2013, 04/19/2014, 02/26/2015, 03/23/2016, 01/06/2017, 12/23/2017  . Pneumococcal Conjugate-13 12/06/2013  . Pneumococcal Polysaccharide-23 12/19/2010  . Td 02/24/2010  . Zoster 12/19/2010    Qualifies for Shingles Vaccine?up to date   Screening Tests Health Maintenance  Topic Date Due  . MAMMOGRAM  06/17/2019  . TETANUS/TDAP  02/25/2020  . COLONOSCOPY  08/04/2020  . INFLUENZA VACCINE  Completed  . DEXA SCAN  Completed  . Hepatitis C Screening  Completed  . PNA vac Low Risk Adult  Completed    Cancer Screenings: Lung: Low Dose CT Chest recommended if Age 57-80 years, 30 pack-year currently smoking OR have quit w/in 15years. Patient does not qualify. Breast:  Up to date on Mammogram? Yes   Up to date of Bone Density/Dexa? Yes Colorectal: up to date   Additional Screenings: : Hepatitis C Screening: complete      Plan:   Continue to increase physical activity, and increase fruits and vegetables    I have personally reviewed and noted the following in the patient's chart:   . Medical and social history . Use of alcohol, tobacco or illicit drugs  . Current medications and supplements . Functional ability and status . Nutritional status . Physical activity . Advanced directives . List of other physicians . Hospitalizations, surgeries, and ER visits in previous 12 months . Vitals . Screenings to include cognitive, depression, and falls . Referrals and appointments  In addition, I have reviewed and discussed with patient certain preventive protocols, quality metrics, and best practice recommendations. A written personalized care plan for preventive services as well as general preventive health recommendations were provided to patient.     Hayden Pedro, LPN  27/10/2374

## 2018-03-30 ENCOUNTER — Ambulatory Visit: Payer: Medicare Other | Admitting: Family Medicine

## 2018-03-31 ENCOUNTER — Other Ambulatory Visit: Payer: Self-pay | Admitting: Family Medicine

## 2018-04-11 ENCOUNTER — Encounter: Payer: Self-pay | Admitting: Family Medicine

## 2018-04-11 ENCOUNTER — Ambulatory Visit (INDEPENDENT_AMBULATORY_CARE_PROVIDER_SITE_OTHER): Payer: Medicare Other | Admitting: Family Medicine

## 2018-04-11 VITALS — BP 170/90 | HR 78 | Temp 99.1°F | Resp 16 | Ht 65.0 in | Wt 166.1 lb

## 2018-04-11 DIAGNOSIS — I10 Essential (primary) hypertension: Secondary | ICD-10-CM

## 2018-04-11 DIAGNOSIS — J320 Chronic maxillary sinusitis: Secondary | ICD-10-CM | POA: Diagnosis not present

## 2018-04-11 DIAGNOSIS — G8929 Other chronic pain: Secondary | ICD-10-CM

## 2018-04-11 MED ORDER — SPIRONOLACTONE 25 MG PO TABS: 25.0000 mg | ORAL_TABLET | Freq: Every day | ORAL | 3 refills | Status: DC

## 2018-04-11 MED ORDER — DOXYCYCLINE HYCLATE 100 MG PO TABS: 100.0000 mg | ORAL_TABLET | Freq: Two times a day (BID) | ORAL | 0 refills | Status: AC

## 2018-04-11 MED ORDER — LISINOPRIL 40 MG PO TABS: 40.0000 mg | ORAL_TABLET | Freq: Every day | ORAL | 3 refills | Status: DC

## 2018-04-11 NOTE — Patient Instructions (Addendum)
F/U 5 weeks, call if you need me sooner  Please get non fasting chem 7 and EGFR 3  To 5 days before next visit  X ray of sinus, tomorrow  1 week only of antibiotic is prescribed, you will need MORE based on history, I am awaiting culture report  You are referred to ENT [place] , that office will call  pls send a GOOD specimen of sinus drainage to lab, collect order and contaner before you leave  BP is too high New higher dose zestril of 40 mg one daily, and new is spironolactone 1 daily,  Please collect all medications today, add start the new spironolactone today , zestril tomorrow since you already had today  Med for pain management will be sent per routine  Hopefully you will improve , best   Wishes for your family

## 2018-04-11 NOTE — Assessment & Plan Note (Addendum)
Reports 3 month h/o chronic nasal drainage , not improved, now worse, Sinus X ray  And nasal drainage for c/s Refer local ENT and stat antibiotic course will need 3 weeks antibiotic  Start 1 week , await c/s report

## 2018-04-11 NOTE — Progress Notes (Signed)
   Sara Woods     MRN: [id number]      DOB: [birth year]   HPI Ms. Shore is here for follow up and re-evaluation of chronic medical conditions, medication management and review of any available recent lab and radiology data.  Preventive health is updated, specifically  Cancer screening and Immunization.   Ongoing right axillary and right periorbital pain and pressure, drainage is thick ,and yellow,YUCK , denies ear pain , sore , throat c/o non  productive cough ROS  Denies chest pains, palpitations and leg swelling Denies abdominal pain, nausea, vomiting,diarrhea or constipation.   Denies dysuria, frequency, hesitancy or incontinence. C/o increased  joint pain, and limitation in mobility. Denies headaches, seizures, numbness, or tingling. Denies depression, anxiety or insomnia. Denies skin break down or rash.   PE  BP (!) 142/84   Pulse 78   Temp 99.1 F (37.3 C) (Oral)   Resp 16   Ht 5\' 5"  (1.651 m)   Wt 166 lb 1.9 oz (75.4 kg)   SpO2 97%   BMI 27.64 kg/m   Patient alert and oriented and in no cardiopulmonary distress.  HEENT: No facial asymmetry, EOMI,   oropharynx pink and moist.  Neck supple no JVD, no mass.Tender over right sinus  Chest: Clear to auscultation bilaterally.  CVS: S1, S2 no murmurs, no S3.Regular rate.  ABD: Soft non tender.   Ext: No edema  MS: Adequate though reduced ROM spine, shoulders, hips and knees.  Skin: Intact, no ulcerations or rash noted.  Psych: Good eye contact, normal affect. Memory intact not anxious or depressed appearing.  CNS: CN 2-12 intact, power,  normal throughout.no focal deficits noted.   Assessment & Plan  Sinusitis, maxillary, chronic Reports 3 month h/o chronic nasal drainage , not improved, now worse, Sinus X ray  And nasal drainage for c/s Refer local ENT and stat antibiotic course will need 3 weeks antibiotic  Start 1 week , await c/s report  Essential hypertension UnControlled, zestril dose increased and  spironolactone added, f/u in 5 weeks DASH diet and commitment to daily physical activity for a minimum of 30 minutes discussed and encouraged, as a part of hypertension management. The importance of attaining a healthy weight is also discussed.  BP/Weight 04/11/2018 03/22/2018 12/23/2017 08/23/2017 04/26/2017 01/06/2017 07/18/4074  Systolic BP 808 811 031 594 585 929 244  Diastolic BP 90 85 86 82 90 78 82  Wt. (Lbs) 166.12 165 165 169 169 173 172.2  BMI 27.64 27.46 26.63 27.28 27.28 27.92 27.79       Encounter for chronic pain management The patient's Controlled Substance registry is reviewed and compliance confirmed. Adequacy of  Pain control and level of function is assessed. Medication dosing is adjusted as deemed appropriate. Twelve weeks of medication is prescribed , patient signs for the script and is provided with a follow up appointment between 16 weeks .

## 2018-04-12 ENCOUNTER — Ambulatory Visit (HOSPITAL_COMMUNITY)
Admission: RE | Admit: 2018-04-12 | Discharge: 2018-04-12 | Disposition: A | Discharge status: Home / Self Care | Payer: Medicare Other | Source: Ambulatory Visit | Attending: Family Medicine | Admitting: Family Medicine

## 2018-04-12 ENCOUNTER — Other Ambulatory Visit (HOSPITAL_COMMUNITY)
Admission: RE | Admit: 2018-04-12 | Discharge: 2018-04-12 | Disposition: A | Discharge status: Home / Self Care | Payer: Medicare Other | Source: Ambulatory Visit | Attending: Family Medicine | Admitting: Family Medicine

## 2018-04-12 DIAGNOSIS — J320 Chronic maxillary sinusitis: Secondary | ICD-10-CM | POA: Insufficient documentation

## 2018-04-12 DIAGNOSIS — R51 Headache: Secondary | ICD-10-CM | POA: Diagnosis not present

## 2018-04-14 LAB — CULTURE, RESPIRATORY

## 2018-04-14 LAB — CULTURE, RESPIRATORY W GRAM STAIN: Culture: NORMAL

## 2018-04-18 ENCOUNTER — Ambulatory Visit (INDEPENDENT_AMBULATORY_CARE_PROVIDER_SITE_OTHER): Payer: Medicare Other | Admitting: Otolaryngology

## 2018-04-18 DIAGNOSIS — J343 Hypertrophy of nasal turbinates: Secondary | ICD-10-CM | POA: Diagnosis not present

## 2018-04-18 DIAGNOSIS — J320 Chronic maxillary sinusitis: Secondary | ICD-10-CM | POA: Diagnosis not present

## 2018-04-19 ENCOUNTER — Other Ambulatory Visit (INDEPENDENT_AMBULATORY_CARE_PROVIDER_SITE_OTHER): Payer: Self-pay | Admitting: Otolaryngology

## 2018-04-19 DIAGNOSIS — J320 Chronic maxillary sinusitis: Secondary | ICD-10-CM

## 2018-04-20 ENCOUNTER — Encounter: Payer: Self-pay | Admitting: Family Medicine

## 2018-04-20 MED ORDER — HYDROCODONE-ACETAMINOPHEN 5-325 MG PO TABS: ORAL_TABLET | ORAL | 0 refills | Status: AC

## 2018-04-20 NOTE — Assessment & Plan Note (Addendum)
The patient's Controlled Substance registry is reviewed and compliance confirmed. Adequacy of  Pain control and level of function is assessed. Medication dosing is adjusted as deemed appropriate. Twelve weeks of medication is prescribed , patient signs for the script and is provided with a follow up appointment between 16 weeks .

## 2018-04-20 NOTE — Assessment & Plan Note (Addendum)
UnControlled, zestril dose increased and spironolactone added, f/u in 5 weeks DASH diet and commitment to daily physical activity for a minimum of 30 minutes discussed and encouraged, as a part of hypertension management. The importance of attaining a healthy weight is also discussed.  BP/Weight 04/11/2018 03/22/2018 12/23/2017 08/23/2017 04/26/2017 01/06/2017 2/56/7209  Systolic BP 198 022 179 810 254 862 824  Diastolic BP 90 85 86 82 90 78 82  Wt. (Lbs) 166.12 165 165 169 169 173 172.2  BMI 27.64 27.46 26.63 27.28 27.28 27.92 27.79

## 2018-05-01 ENCOUNTER — Other Ambulatory Visit: Payer: Self-pay | Admitting: Family Medicine

## 2018-05-16 ENCOUNTER — Ambulatory Visit (INDEPENDENT_AMBULATORY_CARE_PROVIDER_SITE_OTHER): Payer: Medicare Other | Admitting: Otolaryngology

## 2018-05-16 DIAGNOSIS — J320 Chronic maxillary sinusitis: Secondary | ICD-10-CM

## 2018-05-16 DIAGNOSIS — J310 Chronic rhinitis: Secondary | ICD-10-CM

## 2018-05-17 ENCOUNTER — Ambulatory Visit (INDEPENDENT_AMBULATORY_CARE_PROVIDER_SITE_OTHER): Payer: Medicare Other | Admitting: Family Medicine

## 2018-05-17 ENCOUNTER — Encounter: Payer: Self-pay | Admitting: Family Medicine

## 2018-05-17 VITALS — BP 140/86 | HR 84 | Temp 98.6°F | Resp 15 | Ht 65.0 in | Wt 167.0 lb

## 2018-05-17 DIAGNOSIS — G43109 Migraine with aura, not intractable, without status migrainosus: Secondary | ICD-10-CM | POA: Diagnosis not present

## 2018-05-17 DIAGNOSIS — E785 Hyperlipidemia, unspecified: Secondary | ICD-10-CM | POA: Diagnosis not present

## 2018-05-17 DIAGNOSIS — G8929 Other chronic pain: Secondary | ICD-10-CM

## 2018-05-17 DIAGNOSIS — I10 Essential (primary) hypertension: Secondary | ICD-10-CM | POA: Diagnosis not present

## 2018-05-17 DIAGNOSIS — F418 Other specified anxiety disorders: Secondary | ICD-10-CM | POA: Diagnosis not present

## 2018-05-17 DIAGNOSIS — J320 Chronic maxillary sinusitis: Secondary | ICD-10-CM

## 2018-05-17 MED ORDER — TOPIRAMATE 50 MG PO TABS: 50.0000 mg | ORAL_TABLET | Freq: Two times a day (BID) | ORAL | 3 refills | Status: DC

## 2018-05-17 NOTE — Patient Instructions (Addendum)
F/U in 4 month, call if you need me before  You will have medication prescribed as you have in the past  B;lood pressure is slightly elevated today, please reduce salt intake and increase vegetable and fruit  Please get fasting lipid, cmp and eGFR in [place]

## 2018-05-20 ENCOUNTER — Encounter: Payer: Self-pay | Admitting: *Deleted

## 2018-05-22 ENCOUNTER — Encounter: Payer: Self-pay | Admitting: Family Medicine

## 2018-05-22 NOTE — Assessment & Plan Note (Signed)
The patient's Controlled Substance registry is reviewed and compliance confirmed. Adequacy of  Pain control and level of function is assessed. Medication dosing is adjusted as deemed appropriate. Twelve weeks of medication will be  prescribed  April 12 when due  .

## 2018-05-22 NOTE — Assessment & Plan Note (Signed)
Pt reports being upset and hurt by recent interaction with office , every attempt is made both myself as well as my Manager to explain that this is NOT our intent,and we apologize for hurtful communication as she reports hearing it

## 2018-05-22 NOTE — Assessment & Plan Note (Signed)
Controlled, no change in medication  

## 2018-05-22 NOTE — Assessment & Plan Note (Signed)
Elevated SBP at visit, no med change as pt is is upset at the visit DASH diet and commitment to daily physical activity for a minimum of 30 minutes discussed and encouraged, as a part of hypertension management. The importance of attaining a healthy weight is also discussed.  BP/Weight 05/17/2018 04/11/2018 03/22/2018 12/23/2017 08/23/2017 04/26/2017 9/37/3428  Systolic BP 768 115 726 203 559 741 638  Diastolic BP 86 90 85 86 82 90 78  Wt. (Lbs) 167 166.12 165 165 169 169 173  BMI 27.79 27.64 27.46 26.63 27.28 27.28 27.92

## 2018-05-22 NOTE — Assessment & Plan Note (Signed)
Reports improvement over the past 2 weeks  Since completing an additional antibiotic course. ENT ids now responsible for her care

## 2018-05-22 NOTE — Progress Notes (Signed)
   Sara Woods     MRN: [id number]      DOB: 1945/08/29   HPI Sara Woods is here for follow up and re-evaluation of chronic medical conditions, medication management and review of any available recent lab and radiology data.  Has been treated with an additional 2 week course of levaquin for her sinus infection by ENT, has upcoming imaging and fells 100% better A lot of the visit is spent listening to concerns voiced by pt re telephone contact with staff member regarding  The management of her chronic infection , and Office Manager is also called in to address the concerns   ROS See HPI   No c/o chest congestion, productive cough or wheezing. No c/o chest pains, palpitations and leg swelling No c/o abdominal pain, nausea, vomiting,diarrhea or constipation.   No c/o  dysuria, frequency, hesitancy or incontinence. c/o chronic  joint pain, swelling and limitation in mobility. Denies uncontrolled   seizures, numbness, or tingling.c/o controlled headaches  Denies skin break down or rash.   PE  BP 140/86   Pulse 84   Temp 98.6 F (37 C) (Oral)   Resp 15   Ht 5\' 5"  (1.651 m)   Wt 167 lb (75.8 kg)   SpO2 98%   BMI 27.79 kg/m   Patient alert and oriented and in no cardiopulmonary distress.  HEENT: No facial asymmetry, EOMI,   oropharynx pink and moist.  Neck supple .  Chest: Clear to auscultation bilaterally.  CVS: S1, S2 no murmurs, no S3.Regular rate.  ABD: Soft non tender.   Ext: No edema  MS: Decreased  ROM spine, adequate in  shoulders, hips and knees.  Skin: Intact, no ulcerations or rash noted.  ng.  CNS: CN 2-12 intact, .no focal deficits noted.   Assessment & Plan  Encounter for chronic pain management The patient's Controlled Substance registry is reviewed and compliance confirmed. Adequacy of  Pain control and level of function is assessed. Medication dosing is adjusted as deemed appropriate. Twelve weeks of medication will be  prescribed  April 12  when due  .   Depression with anxiety Pt reports being upset and hurt by recent interaction with office , every attempt is made both myself as well as my Manager to explain that this is NOT our intent,and we apologize for hurtful communication as she reports hearing it   Essential hypertension Elevated SBP at visit, no med change as pt is is upset at the visit [place] and commitment to daily physical activity for a minimum of 30 minutes discussed and encouraged, as a part of hypertension management. The importance of attaining a healthy weight is also discussed.  BP/Weight 05/17/2018 04/11/2018 03/22/2018 12/23/2017 08/23/2017 04/26/2017 1/82/9937  Systolic BP 169 678 938 101 751 025 852  Diastolic BP 86 90 85 86 82 90 78  Wt. (Lbs) 167 166.12 165 165 169 169 173  BMI 27.79 27.64 27.46 26.63 27.28 27.28 27.92       Migraine with aura Controlled, no change in medication   Sinusitis, maxillary, chronic Reports improvement over the past 2 weeks  Since completing an additional antibiotic course. ENT ids now responsible for her care

## 2018-05-24 ENCOUNTER — Ambulatory Visit (HOSPITAL_COMMUNITY)
Admission: RE | Admit: 2018-05-24 | Discharge: 2018-05-24 | Disposition: A | Discharge status: Home / Self Care | Payer: Medicare Other | Source: Ambulatory Visit | Attending: Otolaryngology | Admitting: Otolaryngology

## 2018-05-24 DIAGNOSIS — J328 Other chronic sinusitis: Secondary | ICD-10-CM | POA: Diagnosis not present

## 2018-05-24 DIAGNOSIS — J320 Chronic maxillary sinusitis: Secondary | ICD-10-CM | POA: Diagnosis not present

## 2018-05-24 DIAGNOSIS — J329 Chronic sinusitis, unspecified: Secondary | ICD-10-CM | POA: Diagnosis not present

## 2018-06-14 ENCOUNTER — Other Ambulatory Visit: Payer: Self-pay | Admitting: Family Medicine

## 2018-06-22 DIAGNOSIS — E785 Hyperlipidemia, unspecified: Secondary | ICD-10-CM | POA: Diagnosis not present

## 2018-06-22 DIAGNOSIS — I10 Essential (primary) hypertension: Secondary | ICD-10-CM | POA: Diagnosis not present

## 2018-06-22 LAB — COMPLETE METABOLIC PANEL WITH GFR
AG Ratio: 1.8 (calc) (ref 1.0–2.5)
ALT: 24 U/L (ref 6–29)
AST: 19 U/L (ref 10–35)
Albumin: 4.1 g/dL (ref 3.6–5.1)
Alkaline phosphatase (APISO): 81 U/L (ref 37–153)
BUN/Creatinine Ratio: 25 (calc) — ABNORMAL HIGH (ref 6–22)
BUN: 24 mg/dL (ref 7–25)
CO2: 25 mmol/L (ref 20–32)
Calcium: 9.8 mg/dL (ref 8.6–10.4)
Chloride: 111 mmol/L — ABNORMAL HIGH (ref 98–110)
Creat: 0.96 mg/dL — ABNORMAL HIGH (ref 0.60–0.93)
GFR, Est African American: 68 mL/min/{1.73_m2} (ref >=60)
GFR, Est Non African American: 59 mL/min/{1.73_m2} — ABNORMAL LOW (ref >=60)
Globulin: 2.3 g/dL (calc) (ref 1.9–3.7)
Glucose, Bld: 79 mg/dL (ref 65–99)
Potassium: 3.9 mmol/L (ref 3.5–5.3)
SODIUM: 143 mmol/L (ref 135–146)
TOTAL PROTEIN: 6.4 g/dL (ref 6.1–8.1)
Total Bilirubin: 0.5 mg/dL (ref 0.2–1.2)

## 2018-06-22 LAB — LIPID PANEL
CHOLESTEROL: 134 mg/dL (ref <200)
HDL: 49 mg/dL — ABNORMAL LOW (ref >=50)
LDL Cholesterol (Calc): 65 mg/dL (calc)
Non-HDL Cholesterol (Calc): 85 mg/dL (calc) (ref <130)
Total CHOL/HDL Ratio: 2.7 (calc) (ref <5.0)
Triglycerides: 115 mg/dL (ref <150)

## 2018-06-29 ENCOUNTER — Other Ambulatory Visit: Payer: Self-pay | Admitting: Family Medicine

## 2018-06-30 ENCOUNTER — Ambulatory Visit (INDEPENDENT_AMBULATORY_CARE_PROVIDER_SITE_OTHER): Payer: Medicare Other | Admitting: Otolaryngology

## 2018-06-30 DIAGNOSIS — J320 Chronic maxillary sinusitis: Secondary | ICD-10-CM

## 2018-06-30 DIAGNOSIS — J338 Other polyp of sinus: Secondary | ICD-10-CM | POA: Diagnosis not present

## 2018-06-30 DIAGNOSIS — J322 Chronic ethmoidal sinusitis: Secondary | ICD-10-CM

## 2018-06-30 DIAGNOSIS — J321 Chronic frontal sinusitis: Secondary | ICD-10-CM | POA: Diagnosis not present

## 2018-07-30 ENCOUNTER — Other Ambulatory Visit: Payer: Self-pay | Admitting: Family Medicine

## 2018-08-18 ENCOUNTER — Ambulatory Visit: Payer: Medicare Other | Admitting: Family Medicine

## 2018-08-26 ENCOUNTER — Other Ambulatory Visit: Payer: Self-pay | Admitting: Family Medicine

## 2018-09-06 ENCOUNTER — Encounter: Payer: Self-pay | Admitting: *Deleted

## 2018-09-08 ENCOUNTER — Other Ambulatory Visit: Payer: Self-pay | Admitting: Otolaryngology

## 2018-09-08 ENCOUNTER — Encounter: Payer: Self-pay | Admitting: Family Medicine

## 2018-09-08 ENCOUNTER — Other Ambulatory Visit: Payer: Self-pay

## 2018-09-08 ENCOUNTER — Ambulatory Visit (INDEPENDENT_AMBULATORY_CARE_PROVIDER_SITE_OTHER): Payer: Medicare Other | Admitting: Family Medicine

## 2018-09-08 VITALS — BP 140/86 | Ht 65.0 in | Wt 167.0 lb

## 2018-09-08 DIAGNOSIS — J320 Chronic maxillary sinusitis: Secondary | ICD-10-CM

## 2018-09-08 DIAGNOSIS — E785 Hyperlipidemia, unspecified: Secondary | ICD-10-CM

## 2018-09-08 DIAGNOSIS — I10 Essential (primary) hypertension: Secondary | ICD-10-CM | POA: Diagnosis not present

## 2018-09-08 DIAGNOSIS — G43109 Migraine with aura, not intractable, without status migrainosus: Secondary | ICD-10-CM | POA: Diagnosis not present

## 2018-09-08 DIAGNOSIS — G8929 Other chronic pain: Secondary | ICD-10-CM

## 2018-09-08 DIAGNOSIS — Z1231 Encounter for screening mammogram for malignant neoplasm of breast: Secondary | ICD-10-CM | POA: Diagnosis not present

## 2018-09-08 MED ORDER — TOPIRAMATE 50 MG PO TABS: 50.0000 mg | ORAL_TABLET | Freq: Two times a day (BID) | ORAL | 3 refills | Status: DC

## 2018-09-08 MED ORDER — GABAPENTIN 300 MG PO CAPS: 300.0000 mg | ORAL_CAPSULE | Freq: Every day | ORAL | 1 refills | Status: DC

## 2018-09-08 MED ORDER — SPIRONOLACTONE 25 MG PO TABS: 25.0000 mg | ORAL_TABLET | Freq: Every day | ORAL | 5 refills | Status: DC

## 2018-09-08 MED ORDER — DOXEPIN HCL 10 MG PO CAPS: 20.0000 mg | ORAL_CAPSULE | Freq: Every day | ORAL | 5 refills | Status: DC

## 2018-09-08 MED ORDER — ROSUVASTATIN CALCIUM 40 MG PO TABS: 40.0000 mg | ORAL_TABLET | Freq: Every day | ORAL | 5 refills | Status: DC

## 2018-09-08 MED ORDER — DICLOFENAC SODIUM 50 MG PO TBEC: 50.0000 mg | DELAYED_RELEASE_TABLET | Freq: Two times a day (BID) | ORAL | 5 refills | Status: DC

## 2018-09-08 MED ORDER — MECLIZINE HCL 25 MG PO TABS: ORAL_TABLET | ORAL | 5 refills | Status: DC

## 2018-09-08 MED ORDER — LISINOPRIL 40 MG PO TABS: 40.0000 mg | ORAL_TABLET | Freq: Every day | ORAL | 5 refills | Status: DC

## 2018-09-08 NOTE — Progress Notes (Signed)
Virtual Visit via Telephone Note  I connected with Sara Woods on 09/08/18 at 11:00 AM EDT by telephone and verified that I am speaking with the correct person using two identifiers.  Location: Patient: home Provider: office    I discussed the limitations, risks, security and privacy concerns of performing an evaluation and management service by telephone and the availability of in person appointments. I also discussed with the patient that there may be a patient responsible charge related to this service. The patient expressed understanding and agreed to proceed.   History of Present Illness: F/U chronic problems. Lab review and  Pain management  Denies recent fever or chills. C/o chronic sinus pressure and drainage , has surgery scheduled in June , was before but has been delayed due to COVID 19.Denies  nasal congestion, ear pain or sore throat. Denies chest congestion, productive cough or wheezing. Denies chest pains, palpitations and leg swelling Denies abdominal pain, nausea, vomiting,diarrhea or constipation.   Denies dysuria, frequency, hesitancy or incontinence. C/o chronic back  pain,  and limitation in mobility. Denies headaches, seizures, numbness, or tingling. Denies depression,uncontroled nxiety or insomnia. Denies skin break down or rash.       Observations/Objective: BP 140/86   Ht 5\' 5"  (1.651 m)   Wt 167 lb (75.8 kg)   BMI 27.79 kg/m  Good communication with no confusion and intact memory. Alert and oriented x 3 No signs of respiratory distress during sppech    Assessment and Plan: Encounter for chronic pain management The patient's Controlled Substance registry is reviewed and compliance confirmed. Adequacy of  Pain control and level of function is assessed. Medication dosing is adjusted as deemed appropriate. Twelve weeks of medication is prescribed , patient signs for the script and is provided with a follow up appointment between 11 to 12 weeks  .   Essential hypertension Not at goal whe last checked Needs Ov DASH diet and commitment to daily physical activity for a minimum of 30 minutes discussed and encouraged, as a part of hypertension management. The importance of attaining a healthy weight is also discussed.  BP/Weight 09/08/2018 05/17/2018 04/11/2018 03/22/2018 12/23/2017 08/23/2017 59/16/3846  Systolic BP 659 935 701 779 390 300 923  Diastolic BP 86 86 90 85 86 82 90  Wt. (Lbs) 167 167 166.12 165 165 169 169  BMI 27.79 27.79 27.64 27.46 26.63 27.28 27.28       Sinusitis, maxillary, chronic Has upcoming surgery in June 2020, ahs ben managed by ENT for approx 3 months  Hyperlipidemia LDL goal <100 Hyperlipidemia:Low fat diet discussed and encouraged.   Lipid Panel  Lab Results  Component Value Date   CHOL 134 06/22/2018   HDL 49 (L) 06/22/2018   LDLCALC 65 06/22/2018   TRIG 115 06/22/2018   CHOLHDL 2.7 06/22/2018   Controlled, no change in medication     Migraine with aura Controlled with topamax, denies recurrent headache    Follow Up Instructions:    I discussed the assessment and treatment plan with the patient. The patient was provided an opportunity to ask questions and all were answered. The patient agreed with the plan and demonstrated an understanding of the instructions.   The patient was advised to call back or seek an in-person evaluation if the symptoms worsen or if the condition fails to improve as anticipated.  I provided 25 minutes of non-face-to-face time during this encounter.   Tula Nakayama, MD

## 2018-09-08 NOTE — Patient Instructions (Signed)
F/U in 4 months, call if you need me before   Please try to schedule mammogram for September/ October, nOT August per pt request, mail appt info  All the best with your planned sinus surgery  In June   Medication as before   Please increase water intake   Social distancing. Frequent hand washing with soap and water Keeping your hands off of your face.wear a face mask and maintain 6 ft distnce These 3 practices will help to keep both you and your community healthy during this time. Please practice them faithfully!

## 2018-09-11 ENCOUNTER — Encounter: Payer: Self-pay | Admitting: Family Medicine

## 2018-09-11 MED ORDER — HYDROCODONE-ACETAMINOPHEN 5-325 MG PO TABS: ORAL_TABLET | ORAL | 0 refills | Status: DC

## 2018-09-11 NOTE — Assessment & Plan Note (Signed)
Has upcoming surgery in June 2020, ahs ben managed by ENT for approx 3 months

## 2018-09-11 NOTE — Assessment & Plan Note (Signed)
Not at goal whe last checked Needs Ov DASH diet and commitment to daily physical activity for a minimum of 30 minutes discussed and encouraged, as a part of hypertension management. The importance of attaining a healthy weight is also discussed.  BP/Weight 09/08/2018 05/17/2018 04/11/2018 03/22/2018 12/23/2017 08/23/2017 18/40/3754  Systolic BP 360 677 034 035 248 185 909  Diastolic BP 86 86 90 85 86 82 90  Wt. (Lbs) 167 167 166.12 165 165 169 169  BMI 27.79 27.79 27.64 27.46 26.63 27.28 27.28

## 2018-09-11 NOTE — Assessment & Plan Note (Signed)
Hyperlipidemia:Low fat diet discussed and encouraged.   Lipid Panel  Lab Results  Component Value Date   CHOL 134 06/22/2018   HDL 49 (L) 06/22/2018   LDLCALC 65 06/22/2018   TRIG 115 06/22/2018   CHOLHDL 2.7 06/22/2018   Controlled, no change in medication

## 2018-09-11 NOTE — Assessment & Plan Note (Signed)
The patient's Controlled Substance registry is reviewed and compliance confirmed. Adequacy of  Pain control and level of function is assessed. Medication dosing is adjusted as deemed appropriate. Twelve weeks of medication is prescribed , patient signs for the script and is provided with a follow up appointment between 11 to 12 weeks .  

## 2018-09-11 NOTE — Assessment & Plan Note (Signed)
Controlled with topamax, denies recurrent headache

## 2018-09-12 ENCOUNTER — Ambulatory Visit: Payer: Medicare Other | Admitting: Family Medicine

## 2018-09-20 ENCOUNTER — Ambulatory Visit: Payer: Medicare Other | Admitting: Family Medicine

## 2018-10-26 ENCOUNTER — Encounter (HOSPITAL_BASED_OUTPATIENT_CLINIC_OR_DEPARTMENT_OTHER): Payer: Self-pay | Admitting: *Deleted

## 2018-10-28 ENCOUNTER — Other Ambulatory Visit: Payer: Self-pay

## 2018-10-28 ENCOUNTER — Encounter (HOSPITAL_BASED_OUTPATIENT_CLINIC_OR_DEPARTMENT_OTHER)
Admission: RE | Admit: 2018-10-28 | Discharge: 2018-10-28 | Disposition: A | Discharge status: Home / Self Care | Payer: Medicare Other | Source: Ambulatory Visit | Attending: Otolaryngology | Admitting: Otolaryngology

## 2018-10-28 ENCOUNTER — Other Ambulatory Visit (HOSPITAL_COMMUNITY)
Admission: RE | Admit: 2018-10-28 | Discharge: 2018-10-28 | Disposition: A | Discharge status: Home / Self Care | Payer: Medicare Other | Source: Ambulatory Visit | Attending: Otolaryngology | Admitting: Otolaryngology

## 2018-10-28 DIAGNOSIS — Z1159 Encounter for screening for other viral diseases: Secondary | ICD-10-CM | POA: Insufficient documentation

## 2018-10-28 LAB — BASIC METABOLIC PANEL
Anion gap: 5 (ref 5–15)
BUN: 13 mg/dL (ref 8–23)
CO2: 26 mmol/L (ref 22–32)
Calcium: 9.9 mg/dL (ref 8.9–10.3)
Chloride: 113 mmol/L — ABNORMAL HIGH (ref 98–111)
Creatinine, Ser: 0.89 mg/dL (ref 0.44–1.00)
GFR calc Af Amer: >60 mL/min (ref >60)
GFR calc non Af Amer: >60 mL/min (ref >60)
Glucose, Bld: 100 mg/dL — ABNORMAL HIGH (ref 70–99)
Potassium: 4 mmol/L (ref 3.5–5.1)
Sodium: 144 mmol/L (ref 135–145)

## 2018-10-28 LAB — SARS CORONAVIRUS 2 (TAT 6-24 HRS): SARS Coronavirus 2: NEGATIVE

## 2018-11-01 ENCOUNTER — Other Ambulatory Visit: Payer: Self-pay

## 2018-11-01 ENCOUNTER — Encounter (HOSPITAL_BASED_OUTPATIENT_CLINIC_OR_DEPARTMENT_OTHER)
Admission: RE | Disposition: A | Discharge status: Home / Self Care | Payer: Self-pay | Source: Home / Self Care | Attending: Otolaryngology

## 2018-11-01 ENCOUNTER — Ambulatory Visit (HOSPITAL_BASED_OUTPATIENT_CLINIC_OR_DEPARTMENT_OTHER): Payer: Medicare Other | Admitting: Anesthesiology

## 2018-11-01 ENCOUNTER — Encounter (HOSPITAL_BASED_OUTPATIENT_CLINIC_OR_DEPARTMENT_OTHER): Payer: Self-pay | Admitting: Anesthesiology

## 2018-11-01 ENCOUNTER — Ambulatory Visit (HOSPITAL_BASED_OUTPATIENT_CLINIC_OR_DEPARTMENT_OTHER)
Admission: RE | Admit: 2018-11-01 | Discharge: 2018-11-01 | Disposition: A | Discharge status: Home / Self Care | Payer: Medicare Other | Attending: Otolaryngology | Admitting: Otolaryngology

## 2018-11-01 DIAGNOSIS — J342 Deviated nasal septum: Secondary | ICD-10-CM | POA: Diagnosis not present

## 2018-11-01 DIAGNOSIS — J324 Chronic pansinusitis: Secondary | ICD-10-CM | POA: Insufficient documentation

## 2018-11-01 DIAGNOSIS — J329 Chronic sinusitis, unspecified: Secondary | ICD-10-CM | POA: Diagnosis present

## 2018-11-01 DIAGNOSIS — J3489 Other specified disorders of nose and nasal sinuses: Secondary | ICD-10-CM | POA: Insufficient documentation

## 2018-11-01 DIAGNOSIS — J320 Chronic maxillary sinusitis: Secondary | ICD-10-CM | POA: Diagnosis not present

## 2018-11-01 DIAGNOSIS — J338 Other polyp of sinus: Secondary | ICD-10-CM | POA: Insufficient documentation

## 2018-11-01 DIAGNOSIS — J323 Chronic sphenoidal sinusitis: Secondary | ICD-10-CM | POA: Diagnosis not present

## 2018-11-01 DIAGNOSIS — J321 Chronic frontal sinusitis: Secondary | ICD-10-CM | POA: Diagnosis not present

## 2018-11-01 DIAGNOSIS — J343 Hypertrophy of nasal turbinates: Secondary | ICD-10-CM | POA: Diagnosis not present

## 2018-11-01 DIAGNOSIS — I10 Essential (primary) hypertension: Secondary | ICD-10-CM | POA: Insufficient documentation

## 2018-11-01 DIAGNOSIS — J322 Chronic ethmoidal sinusitis: Secondary | ICD-10-CM | POA: Diagnosis not present

## 2018-11-01 HISTORY — PX: NASAL SEPTOPLASTY W/ TURBINOPLASTY: SHX2070

## 2018-11-01 HISTORY — PX: FRONTAL SINUS EXPLORATION: SHX6591

## 2018-11-01 HISTORY — PX: MAXILLARY ANTROSTOMY: SHX2003

## 2018-11-01 HISTORY — PX: SINUS ENDO WITH FUSION: SHX5329

## 2018-11-01 HISTORY — PX: ETHMOIDECTOMY: SHX5197

## 2018-11-01 HISTORY — PX: SPHENOIDECTOMY: SHX2421

## 2018-11-01 SURGERY — SEPTOPLASTY, NOSE, WITH NASAL TURBINATE REDUCTION
Anesthesia: General | Site: Nose | Laterality: Right

## 2018-11-01 MED ORDER — DEXAMETHASONE SODIUM PHOSPHATE 4 MG/ML IJ SOLN
INTRAMUSCULAR | Status: DC | PRN
  Administered 2018-11-01: 10 mg via INTRAVENOUS

## 2018-11-01 MED ORDER — FENTANYL CITRATE (PF) 100 MCG/2ML IJ SOLN
25.0000 ug | INTRAMUSCULAR | Status: DC | PRN
  Administered 2018-11-01: 50 ug via INTRAVENOUS

## 2018-11-01 MED ORDER — MUPIROCIN 2 % EX OINT
TOPICAL_OINTMENT | CUTANEOUS | Status: DC | PRN
  Administered 2018-11-01: 1 via NASAL

## 2018-11-01 MED ORDER — CLINDAMYCIN HCL 300 MG PO CAPS: 300.0000 mg | ORAL_CAPSULE | Freq: Three times a day (TID) | ORAL | 0 refills | Status: AC

## 2018-11-01 MED ORDER — MEPERIDINE HCL 25 MG/ML IJ SOLN: 6.2500 mg | INTRAMUSCULAR | Status: DC | PRN

## 2018-11-01 MED ORDER — LIDOCAINE HCL (CARDIAC) PF 100 MG/5ML IV SOSY
PREFILLED_SYRINGE | INTRAVENOUS | Status: DC | PRN
  Administered 2018-11-01: 100 mg via INTRAVENOUS

## 2018-11-01 MED ORDER — DEXAMETHASONE SODIUM PHOSPHATE 10 MG/ML IJ SOLN
INTRAMUSCULAR | Status: AC
  Filled 2018-11-01: qty 1

## 2018-11-01 MED ORDER — FENTANYL CITRATE (PF) 100 MCG/2ML IJ SOLN
50.0000 ug | INTRAMUSCULAR | Status: DC | PRN
  Administered 2018-11-01 (×2): 50 ug via INTRAVENOUS

## 2018-11-01 MED ORDER — OXYCODONE-ACETAMINOPHEN 5-325 MG PO TABS: 1.0000 | ORAL_TABLET | ORAL | 0 refills | Status: AC | PRN

## 2018-11-01 MED ORDER — LIDOCAINE-EPINEPHRINE 1 %-1:100000 IJ SOLN
INTRAMUSCULAR | Status: DC | PRN
  Administered 2018-11-01: 3.5 mL

## 2018-11-01 MED ORDER — SCOPOLAMINE 1 MG/3DAYS TD PT72: 1.0000 | MEDICATED_PATCH | Freq: Once | TRANSDERMAL | Status: DC

## 2018-11-01 MED ORDER — PROPOFOL 10 MG/ML IV BOLUS
INTRAVENOUS | Status: DC | PRN
  Administered 2018-11-01: 150 mg via INTRAVENOUS

## 2018-11-01 MED ORDER — CLINDAMYCIN PHOSPHATE 600 MG/50ML IV SOLN
INTRAVENOUS | Status: DC | PRN
  Administered 2018-11-01: 600 mg via INTRAVENOUS

## 2018-11-01 MED ORDER — CLINDAMYCIN PHOSPHATE 600 MG/50ML IV SOLN
INTRAVENOUS | Status: AC
  Filled 2018-11-01: qty 50

## 2018-11-01 MED ORDER — MUPIROCIN 2 % EX OINT
TOPICAL_OINTMENT | CUTANEOUS | Status: AC
  Filled 2018-11-01: qty 22

## 2018-11-01 MED ORDER — MIDAZOLAM HCL 2 MG/2ML IJ SOLN: 1.0000 mg | INTRAMUSCULAR | Status: DC | PRN

## 2018-11-01 MED ORDER — PHENYLEPHRINE 40 MCG/ML (10ML) SYRINGE FOR IV PUSH (FOR BLOOD PRESSURE SUPPORT)
PREFILLED_SYRINGE | INTRAVENOUS | Status: DC | PRN
  Administered 2018-11-01: 80 ug via INTRAVENOUS
  Administered 2018-11-01: 40 ug via INTRAVENOUS

## 2018-11-01 MED ORDER — OXYMETAZOLINE HCL 0.05 % NA SOLN
NASAL | Status: AC
  Filled 2018-11-01: qty 30

## 2018-11-01 MED ORDER — ARTIFICIAL TEARS OPHTHALMIC OINT
TOPICAL_OINTMENT | OPHTHALMIC | Status: AC
  Filled 2018-11-01: qty 3.5

## 2018-11-01 MED ORDER — ONDANSETRON HCL 4 MG/2ML IJ SOLN
INTRAMUSCULAR | Status: AC
  Filled 2018-11-01: qty 2

## 2018-11-01 MED ORDER — PROPOFOL 10 MG/ML IV BOLUS
INTRAVENOUS | Status: AC
  Filled 2018-11-01: qty 20

## 2018-11-01 MED ORDER — OXYMETAZOLINE HCL 0.05 % NA SOLN
NASAL | Status: DC | PRN
  Administered 2018-11-01: 1 via TOPICAL

## 2018-11-01 MED ORDER — SUCCINYLCHOLINE CHLORIDE 200 MG/10ML IV SOSY
PREFILLED_SYRINGE | INTRAVENOUS | Status: AC
  Filled 2018-11-01: qty 10

## 2018-11-01 MED ORDER — FENTANYL CITRATE (PF) 100 MCG/2ML IJ SOLN
INTRAMUSCULAR | Status: AC
  Filled 2018-11-01: qty 2

## 2018-11-01 MED ORDER — SUGAMMADEX SODIUM 200 MG/2ML IV SOLN
INTRAVENOUS | Status: DC | PRN
  Administered 2018-11-01: 200 mg via INTRAVENOUS

## 2018-11-01 MED ORDER — ONDANSETRON HCL 4 MG/2ML IJ SOLN
INTRAMUSCULAR | Status: DC | PRN
  Administered 2018-11-01: 4 mg via INTRAVENOUS

## 2018-11-01 MED ORDER — LIDOCAINE 2% (20 MG/ML) 5 ML SYRINGE
INTRAMUSCULAR | Status: AC
  Filled 2018-11-01: qty 5

## 2018-11-01 MED ORDER — ROCURONIUM BROMIDE 50 MG/5ML IV SOSY
PREFILLED_SYRINGE | INTRAVENOUS | Status: DC | PRN
  Administered 2018-11-01: 60 mg via INTRAVENOUS

## 2018-11-01 MED ORDER — LACTATED RINGERS IV SOLN
INTRAVENOUS | Status: DC
  Administered 2018-11-01 (×2): via INTRAVENOUS

## 2018-11-01 MED ORDER — METOCLOPRAMIDE HCL 5 MG/ML IJ SOLN: 10.0000 mg | Freq: Once | INTRAMUSCULAR | Status: DC | PRN

## 2018-11-01 MED ORDER — LIDOCAINE-EPINEPHRINE 1 %-1:100000 IJ SOLN
INTRAMUSCULAR | Status: AC
  Filled 2018-11-01: qty 1

## 2018-11-01 SURGICAL SUPPLY — 72 items
ATTRACTOMAT 16X20 MAGNETIC DRP (DRAPES) IMPLANT
BLADE RAD40 ROTATE 4M 4 5PK (BLADE) IMPLANT
BLADE RAD40 ROTATE 4M 4MM 5PK (BLADE)
BLADE RAD60 ROTATE M4 4 5PK (BLADE) IMPLANT
BLADE RAD60 ROTATE M4 4MM 5PK (BLADE)
BLADE ROTATE RAD 12 4 M4 (BLADE) IMPLANT
BLADE ROTATE RAD 12 4MM M4 (BLADE)
BLADE ROTATE RAD 40 4 M4 (BLADE) IMPLANT
BLADE ROTATE RAD 40 4MM M4 (BLADE)
BLADE ROTATE TRICUT 4MX13CM M4 (BLADE) ×1
BLADE ROTATE TRICUT 4X13 M4 (BLADE) ×3 IMPLANT
BLADE TRICUT ROTATE M4 4 5PK (BLADE) IMPLANT
BLADE TRICUT ROTATE M4 4MM 5PK (BLADE)
BUR HS RAD FRONTAL 3 (BURR) IMPLANT
BUR HS RAD FRONTAL 3MM (BURR)
CANISTER SUC SOCK COL 7IN (MISCELLANEOUS) ×8 IMPLANT
CANISTER SUCT 1200ML W/VALVE (MISCELLANEOUS) ×4 IMPLANT
COAGULATOR SUCT 6 FR SWTCH (ELECTROSURGICAL)
COAGULATOR SUCT 8FR VV (MISCELLANEOUS) ×4 IMPLANT
COAGULATOR SUCT SWTCH 10FR 6 (ELECTROSURGICAL) IMPLANT
COVER WAND RF STERILE (DRAPES) IMPLANT
DECANTER SPIKE VIAL GLASS SM (MISCELLANEOUS) IMPLANT
DRSG NASAL KENNEDY LMNT 8CM (GAUZE/BANDAGES/DRESSINGS) IMPLANT
DRSG NASOPORE 8CM (GAUZE/BANDAGES/DRESSINGS) IMPLANT
DRSG TELFA 3X8 NADH (GAUZE/BANDAGES/DRESSINGS) IMPLANT
ELECT REM PT RETURN 9FT ADLT (ELECTROSURGICAL) ×4
ELECTRODE REM PT RTRN 9FT ADLT (ELECTROSURGICAL) ×2 IMPLANT
GLOVE BIO SURGEON STRL SZ 6.5 (GLOVE) ×2 IMPLANT
GLOVE BIO SURGEON STRL SZ7.5 (GLOVE) ×4 IMPLANT
GLOVE BIO SURGEONS STRL SZ 6.5 (GLOVE) ×2
GLOVE BIOGEL PI IND STRL 7.0 (GLOVE) IMPLANT
GLOVE BIOGEL PI INDICATOR 7.0 (GLOVE) ×4
GOWN STRL REUS W/ TWL LRG LVL3 (GOWN DISPOSABLE) ×4 IMPLANT
GOWN STRL REUS W/TWL LRG LVL3 (GOWN DISPOSABLE) ×4
HEMOSTAT SURGICEL 2X14 (HEMOSTASIS) IMPLANT
IV NS 1000ML (IV SOLUTION)
IV NS 1000ML BAXH (IV SOLUTION) IMPLANT
IV NS 500ML (IV SOLUTION) ×2
IV NS 500ML BAXH (IV SOLUTION) ×4 IMPLANT
NDL HYPO 25X1 1.5 SAFETY (NEEDLE) ×2 IMPLANT
NDL PRECISIONGLIDE 27X1.5 (NEEDLE) ×2 IMPLANT
NDL SPNL 25GX3.5 QUINCKE BL (NEEDLE) IMPLANT
NEEDLE HYPO 25X1 1.5 SAFETY (NEEDLE) ×4 IMPLANT
NEEDLE PRECISIONGLIDE 27X1.5 (NEEDLE) IMPLANT
NEEDLE SPNL 25GX3.5 QUINCKE BL (NEEDLE) IMPLANT
NS IRRIG 1000ML POUR BTL (IV SOLUTION) ×4 IMPLANT
PACK BASIN DAY SURGERY FS (CUSTOM PROCEDURE TRAY) ×4 IMPLANT
PACK ENT DAY SURGERY (CUSTOM PROCEDURE TRAY) ×4 IMPLANT
PAD DRESSING TELFA 3X8 NADH (GAUZE/BANDAGES/DRESSINGS) IMPLANT
SLEEVE SCD COMPRESS KNEE MED (MISCELLANEOUS) IMPLANT
SOLUTION BUTLER CLEAR DIP (MISCELLANEOUS) ×6 IMPLANT
SPLINT NASAL AIRWAY SILICONE (MISCELLANEOUS) ×2 IMPLANT
SPONGE GAUZE 2X2 8PLY STER LF (GAUZE/BANDAGES/DRESSINGS) ×1
SPONGE GAUZE 2X2 8PLY STRL LF (GAUZE/BANDAGES/DRESSINGS) ×3 IMPLANT
SPONGE NEURO XRAY DETECT 1X3 (DISPOSABLE) ×4 IMPLANT
SUCTION FRAZIER HANDLE 10FR (MISCELLANEOUS)
SUCTION TUBE FRAZIER 10FR DISP (MISCELLANEOUS) IMPLANT
SUT CHROMIC 4 0 P 3 18 (SUTURE) ×4 IMPLANT
SUT PLAIN 4 0 ~~LOC~~ 1 (SUTURE) ×4 IMPLANT
SUT PROLENE 3 0 PS 2 (SUTURE) ×2 IMPLANT
SUT VIC AB 4-0 P-3 18XBRD (SUTURE) IMPLANT
SUT VIC AB 4-0 P3 18 (SUTURE)
SYR 50ML LL SCALE MARK (SYRINGE) ×4 IMPLANT
TOWEL GREEN STERILE FF (TOWEL DISPOSABLE) ×4 IMPLANT
TRACKER ENT INSTRUMENT (MISCELLANEOUS) ×4 IMPLANT
TRACKER ENT PATIENT (MISCELLANEOUS) ×4 IMPLANT
TUBE CONNECTING 20'X1/4 (TUBING) ×1
TUBE CONNECTING 20X1/4 (TUBING) ×3 IMPLANT
TUBE SALEM SUMP 12R W/ARV (TUBING) IMPLANT
TUBE SALEM SUMP 16 FR W/ARV (TUBING) ×4 IMPLANT
TUBING STRAIGHTSHOT EPS 5PK (TUBING) ×4 IMPLANT
YANKAUER SUCT BULB TIP NO VENT (SUCTIONS) ×4 IMPLANT

## 2018-11-01 NOTE — Anesthesia Postprocedure Evaluation (Signed)
Anesthesia Post Note  Patient: Sara Woods  Procedure(s) Performed: NASAL SEPTOPLASTY WITH TURBINATE REDUCTION (N/A Nose) RIGHT ENDOSCOPIC MAXILLARY ANTROSTOMY WITH TISSUE REMOVAL (Right Nose) FRONTAL SINUS EXPLORATION (Right Nose) RIGHT TOTAL ETHMOIDECTOMY (Right Nose) RIGHT SPHENOIDECTOMY WITH TISSUE REMOVAL (Right Nose) SINUS ENDO WITH FUSION (N/A Nose)     Patient location during evaluation: PACU Anesthesia Type: General Level of consciousness: awake and alert Pain management: pain level controlled Vital Signs Assessment: post-procedure vital signs reviewed and stable Respiratory status: spontaneous breathing, nonlabored ventilation, respiratory function stable and patient connected to nasal cannula oxygen Cardiovascular status: blood pressure returned to baseline and stable Postop Assessment: no apparent nausea or vomiting Anesthetic complications: no    Last Vitals:  Vitals:   11/01/18 1323 11/01/18 1330  BP: (!) 187/103 (!) 196/86  Pulse: 62   Resp: 18   Temp: 36.8 C   SpO2: 99%     Last Pain:  Vitals:   11/01/18 1323  TempSrc: Oral  PainSc: 0-No pain                 Montez Hageman

## 2018-11-01 NOTE — Transfer of Care (Signed)
Immediate Anesthesia Transfer of Care Note  Patient: Sara Woods  Procedure(s) Performed: NASAL SEPTOPLASTY WITH TURBINATE REDUCTION (N/A Nose) RIGHT ENDOSCOPIC MAXILLARY ANTROSTOMY WITH TISSUE REMOVAL (Right Nose) FRONTAL SINUS EXPLORATION (Right Nose) RIGHT TOTAL ETHMOIDECTOMY (Right Nose) RIGHT SPHENOIDECTOMY WITH TISSUE REMOVAL (Right Nose) SINUS ENDO WITH FUSION (N/A Nose)  Patient Location: PACU  Anesthesia Type:General  Level of Consciousness: awake  Airway & Oxygen Therapy: Patient Spontanous Breathing and Patient connected to face mask oxygen  Post-op Assessment: Report given to RN and Post -op Vital signs reviewed and stable  Post vital signs: Reviewed and stable  Last Vitals:  Vitals Value Taken Time  BP 177/100 11/01/18 1151  Temp    Pulse 66 11/01/18 1153  Resp 14 11/01/18 1153  SpO2 100 % 11/01/18 1153  Vitals shown include unvalidated device data.  Last Pain:  Vitals:   11/01/18 0806  TempSrc: Oral  PainSc: 0-No pain      Patients Stated Pain Goal: 1 (24/82/50 0370)  Complications: No apparent anesthesia complications

## 2018-11-01 NOTE — Discharge Instructions (Addendum)

## 2018-11-01 NOTE — Anesthesia Procedure Notes (Signed)
Procedure Name: Intubation Date/Time: 11/01/2018 9:50 AM Performed by: Lyndee Leo, CRNA Pre-anesthesia Checklist: Patient identified, Emergency Drugs available, Suction available and Patient being monitored Patient Re-evaluated:Patient Re-evaluated prior to induction Oxygen Delivery Method: Circle system utilized Preoxygenation: Pre-oxygenation with 100% oxygen Induction Type: IV induction Ventilation: Mask ventilation without difficulty Laryngoscope Size: Mac and 3 Grade View: Grade II Tube type: Oral Tube size: 7.0 mm Number of attempts: 1 Airway Equipment and Method: Stylet and Oral airway Placement Confirmation: ETT inserted through vocal cords under direct vision,  positive ETCO2 and breath sounds checked- equal and bilateral Secured at: 20 cm Tube secured with: Tape Dental Injury: Teeth and Oropharynx as per pre-operative assessment

## 2018-11-01 NOTE — H&P (Signed)
Cc: Chronic rhinosinusitis, chronic nasal obstruction  HPI: The patient is a 73 year old female who returns today for her follow-up evaluation. The patient has a history of frequent recurrent sinusitis.  She was previously treated with multiple courses of extended antibiotics.  Despite her treatment, she continues to be symptomatic.  At her last visit, her acute sinusitis had resolved.  She subsequently underwent a sinus CT scan.  The CT showed persistent opacification of her right paranasal sinuses, involving the frontal, ethmoid, maxillary and sphenoid sinuses.  In addition, the patient also has significant nasal septal deviation to the right and bilateral inferior turbinate hypertrophy.  The patient returns today complaining of persistent nasal obstruction. Her right sided facial pain has recurred.  She is still on Flonase nasal spray and nasal saline irrigation. She is interested in more definitive treatment of her chronic conditions. No other ENT, GI, or respiratory issue noted since the last visit.   Exam: General: Communicates without difficulty, well nourished, no acute distress. Head: Normocephalic, no evidence injury, no tenderness, facial buttresses intact without stepoff. Face/sinus: No tenderness to palpation and percussion. Facial movement is normal and symmetric. Eyes: PERRL, EOMI. No scleral icterus, conjunctivae clear. Neuro: CN II exam reveals vision grossly intact.  No nystagmus at any point of gaze. Ears: Auricles well formed without lesions.  Ear canals are intact without mass or lesion.  No erythema or edema is appreciated.  The TMs are intact without fluid. Nose: External evaluation reveals normal support and skin without lesions.  Dorsum is intact.  Anterior rhinoscopy reveals congested mucosa over anterior aspect of inferior turbinates and deviated septum.  Oral:  Oral cavity and oropharynx are intact, symmetric, without erythema or edema.  Mucosa is moist without lesions. Neck: Full  range of motion without pain.  There is no significant lymphadenopathy.  No masses palpable.  Thyroid bed within normal limits to palpation.  Parotid glands and submandibular glands equal bilaterally without mass.  Trachea is midline. Neuro:  CN 2-12 grossly intact. Gait normal.   Assessment 1.  Chronic right sided sinusitis, involving the frontal, maxillary, ethmoid and sphenoid sinuses.  The patient has not responded to multiple courses of extended antibiotics.  2.  Chronic nasal obstruction secondary to nasal septal deviation and bilateral inferior turbinate hypertrophy.   Plan  1.  The physical exam findings and the CT images are extensively reviewed with the patient.  2.  Repeat Levofloxacin 500 mg p.o. daily for 2 weeks.  3.  Continue with Flonase nasal spray daily.  4.  Based on the above findings, the patient is a candidate to undergo endoscopic sinus surgery, septoplasty, and turbinate reduction to treat her chronic sinusitis and chronic nasal obstructions.  The risks, benefits, alternatives and details of the procedures are reviewed with the patient. Questions are invited and answered.  5.  The patient would like to proceed with the procedures.

## 2018-11-01 NOTE — Anesthesia Preprocedure Evaluation (Signed)
Anesthesia Evaluation  Patient identified by MRN, date of birth, ID band Patient awake    Reviewed: Allergy & Precautions, NPO status , Patient's Chart, lab work & pertinent test results  Airway Mallampati: II  TM Distance: >3 FB Neck ROM: Full    Dental no notable dental hx.    Pulmonary neg pulmonary ROS,    Pulmonary exam normal breath sounds clear to auscultation       Cardiovascular hypertension, Pt. on medications Normal cardiovascular exam Rhythm:Regular Rate:Normal     Neuro/Psych negative neurological ROS  negative psych ROS   GI/Hepatic negative GI ROS, Neg liver ROS,   Endo/Other  negative endocrine ROS  Renal/GU negative Renal ROS  negative genitourinary   Musculoskeletal negative musculoskeletal ROS (+)   Abdominal   Peds negative pediatric ROS (+)  Hematology negative hematology ROS (+)   Anesthesia Other Findings   Reproductive/Obstetrics negative OB ROS                             Anesthesia Physical Anesthesia Plan  ASA: II  Anesthesia Plan: General   Post-op Pain Management:    Induction: Intravenous  PONV Risk Score and Plan: 4 or greater and Ondansetron, Dexamethasone, Treatment may vary due to age or medical condition and Midazolam  Airway Management Planned: Oral ETT  Additional Equipment:   Intra-op Plan:   Post-operative Plan: Extubation in OR  Informed Consent: I have reviewed the patients History and Physical, chart, labs and discussed the procedure including the risks, benefits and alternatives for the proposed anesthesia with the patient or authorized representative who has indicated his/her understanding and acceptance.     Dental advisory given  Plan Discussed with: CRNA  Anesthesia Plan Comments:         Anesthesia Quick Evaluation

## 2018-11-01 NOTE — Op Note (Signed)
DATE OF PROCEDURE: 11/01/2018  OPERATIVE REPORT   SURGEON: Leta Baptist, MD   PREOPERATIVE DIAGNOSES:  1. Severe nasal septal deviation.  2. Bilateral inferior turbinate hypertrophy.  3. Chronic nasal obstruction. 4. Chronic right-sided pansinusitis and polyposis.  POSTOPERATIVE DIAGNOSES:  1. Severe nasal septal deviation.  2. Bilateral inferior turbinate hypertrophy.  3. Chronic nasal obstruction. 4. Chronic right-sided pansinusitis and polyposis.  PROCEDURE PERFORMED:  1. Septoplasty.  2. Bilateral partial inferior turbinate resection.  3. Endoscopic right frontal sinusotomy with polyp removal. 4. Endoscopic right total ethmoidectomy and sphenoidotomy with polyp removal. 5. Endoscopic right maxillary antrostomy with polyp removal. 6. FUSION stereotactic image guidance.  ANESTHESIA: General endotracheal tube anesthesia.   COMPLICATIONS: None.   ESTIMATED BLOOD LOSS: 500 mL.   INDICATION FOR PROCEDURE: DELMI FULFER is a 73 y.o. female with a history of chronic right-sided rhinosinusitis and chronic nasal obstruction. The patient was previously treated with multiple courses of extended antibiotics, antihistamine, decongestant, steroid nasal spray, and systemic steroids. However, the patient continued to be symptomatic. On examination, the patient was noted to have bilateral severe inferior turbinate hypertrophy and significant nasal septal deviation, causing significant nasal obstruction.  Mucopurulent drainage was noted from the right middle meatus.  On her CT scan, her right-sided paranasal sinuses were opacified. Based on the above findings, the decision was made for the patient to undergo the above-stated procedures. The risks, benefits, alternatives, and details of the procedures were discussed with the patient. Questions were invited and answered. Informed consent was obtained.   DESCRIPTION OF PROCEDURE: The patient was taken to the operating room and placed supine on the  operating table. General endotracheal tube anesthesia was administered by the anesthesiologist. The patient was positioned, and prepped and draped in the standard fashion for nasal surgery. Pledgets soaked with Afrin were placed in both nasal cavities for decongestion. The pledgets were subsequently removed. The FUSION stereotactic image guidance marker was placed. The image guidance system was functional throughout the case.  Examination of the nasal cavity revealed a severe nasal septal deviationd. 1% lidocaine with 1:100,000 epinephrine was injected onto the nasal septum bilaterally. A hemitransfixion incision was made on the left side. The mucosal flap was carefully elevated on the left side. A cartilaginous incision was made 1 cm superior to the caudal margin of the nasal septum. Mucosal flap was also elevated on the right side in the similar fashion. It should be noted that due to the severe septal deviation, the deviated portion of the cartilaginous and bony septum had to be removed in piecemeal fashion. Once the deviated portions were removed, a straight midline septum was achieved. The septum was then quilted with 4-0 plain gut sutures. The hemitransfixion incision was closed with interrupted 4-0 chromic sutures.   The inferior one half of both hypertrophied inferior turbinate was crossclamped with a Kelly clamp. The inferior one half of each inferior turbinate was then resected with a pair of cross cutting scissors. Hemostasis was achieved with a suction cautery device.   Using a 0 endoscope, the right nasal cavity was examined. The right middle turbinate was carefully medialized. The inferior one third of the middle turbinate was resected to facilitate access to the paranasal sinuses.  Polypoid tissue was noted within the middle meatus. The polypoid tissue was removed using a combination of microdebrider and Blakesley forceps. The uncinate process was resected with a freer elevator. The maxillary  antrum was entered and enlarged using a combination of backbiter and microdebrider.  A large amount  of mucopurulent drainage was suctioned.  Polypoid tissue was removed from the right maxillary sinus.  Attention was then focused on the ethmoid sinuses. The bony partitions of the anterior and posterior ethmoid cavities were taken down. Polypoid tissue was noted and removed.  More polyps were noted to obstruct the left sphenoid opening. The polyps were removed. The sphenoid opening was entered and enlarged.  More polypoid mucosa was removed.  Attention was then focused on the frontal sinus. The frontal recess was identified and enlarged by removing the surrounding bony partitions. Polypoid tissue was removed from the frontal recess. All 4 paranasal sinuses were copiously irrigated with saline solution.  Doyle splints were applied to the nasal septum.   The care of the patient was turned over to the anesthesiologist. The patient was awakened from anesthesia without difficulty. The patient was extubated and transferred to the recovery room in good condition.   OPERATIVE FINDINGS: Severe nasal septal deviation and bilateral inferior turbinate hypertrophy.  Chronic right-sided pansinusitis and polyposis.  SPECIMEN: Right sinus contents.  FOLLOWUP CARE: The patient be discharged home once she is awake and alert. The patient will be placed on Percocet 1 tablets p.o. q.4 hours p.r.n. pain, and clindamycin for 3 days. The patient will follow up in my office in 2 days for splint removal.   Zakyla Tonche Raynelle Bring, MD

## 2018-11-02 ENCOUNTER — Encounter (HOSPITAL_BASED_OUTPATIENT_CLINIC_OR_DEPARTMENT_OTHER): Payer: Self-pay | Admitting: Otolaryngology

## 2018-11-03 ENCOUNTER — Ambulatory Visit (INDEPENDENT_AMBULATORY_CARE_PROVIDER_SITE_OTHER): Payer: Medicare Other | Admitting: Otolaryngology

## 2018-11-03 DIAGNOSIS — J320 Chronic maxillary sinusitis: Secondary | ICD-10-CM | POA: Diagnosis not present

## 2018-11-03 DIAGNOSIS — J338 Other polyp of sinus: Secondary | ICD-10-CM | POA: Diagnosis not present

## 2018-11-03 DIAGNOSIS — J321 Chronic frontal sinusitis: Secondary | ICD-10-CM

## 2018-11-03 DIAGNOSIS — J322 Chronic ethmoidal sinusitis: Secondary | ICD-10-CM | POA: Diagnosis not present

## 2018-11-17 ENCOUNTER — Ambulatory Visit (INDEPENDENT_AMBULATORY_CARE_PROVIDER_SITE_OTHER): Payer: Medicare Other | Admitting: Otolaryngology

## 2018-11-17 DIAGNOSIS — J322 Chronic ethmoidal sinusitis: Secondary | ICD-10-CM

## 2018-11-17 DIAGNOSIS — J321 Chronic frontal sinusitis: Secondary | ICD-10-CM | POA: Diagnosis not present

## 2018-11-17 DIAGNOSIS — J323 Chronic sphenoidal sinusitis: Secondary | ICD-10-CM | POA: Diagnosis not present

## 2018-11-17 DIAGNOSIS — J320 Chronic maxillary sinusitis: Secondary | ICD-10-CM

## 2018-12-08 ENCOUNTER — Ambulatory Visit (INDEPENDENT_AMBULATORY_CARE_PROVIDER_SITE_OTHER): Payer: Medicare Other | Admitting: Otolaryngology

## 2018-12-08 DIAGNOSIS — J323 Chronic sphenoidal sinusitis: Secondary | ICD-10-CM

## 2018-12-08 DIAGNOSIS — J320 Chronic maxillary sinusitis: Secondary | ICD-10-CM | POA: Diagnosis not present

## 2018-12-08 DIAGNOSIS — J322 Chronic ethmoidal sinusitis: Secondary | ICD-10-CM | POA: Diagnosis not present

## 2018-12-08 DIAGNOSIS — J321 Chronic frontal sinusitis: Secondary | ICD-10-CM | POA: Diagnosis not present

## 2018-12-26 ENCOUNTER — Other Ambulatory Visit: Payer: Self-pay

## 2018-12-26 ENCOUNTER — Telehealth (INDEPENDENT_AMBULATORY_CARE_PROVIDER_SITE_OTHER): Payer: Medicare Other | Admitting: Family Medicine

## 2018-12-26 ENCOUNTER — Encounter: Payer: Self-pay | Admitting: Family Medicine

## 2018-12-26 DIAGNOSIS — M549 Dorsalgia, unspecified: Secondary | ICD-10-CM | POA: Diagnosis not present

## 2018-12-26 DIAGNOSIS — I10 Essential (primary) hypertension: Secondary | ICD-10-CM | POA: Diagnosis not present

## 2018-12-26 DIAGNOSIS — G8929 Other chronic pain: Secondary | ICD-10-CM

## 2018-12-26 DIAGNOSIS — E785 Hyperlipidemia, unspecified: Secondary | ICD-10-CM

## 2018-12-26 DIAGNOSIS — G43109 Migraine with aura, not intractable, without status migrainosus: Secondary | ICD-10-CM | POA: Diagnosis not present

## 2018-12-26 MED ORDER — HYDROCODONE-ACETAMINOPHEN 5-325 MG PO TABS: ORAL_TABLET | ORAL | 0 refills | Status: DC

## 2018-12-26 NOTE — Patient Instructions (Addendum)
F/u with mD in 2 weeks in office re eval blood pressure and for flu vaccine, call if you need me sooner  Thankful surgery went well  Please get fasting lipid, cmp and EGFR, 3 to 5 days before follow up  Mammogram is deferred until 2021 as we discussed per your request  Thanks for choosing [facility], we consider it a privelige to serve you.

## 2018-12-26 NOTE — Progress Notes (Signed)
Virtual Visit via Telephone Note  I connected with Sara Woods on 12/26/18 at  2:20 PM EDT by telephone and verified that I am speaking with the correct person using two identifiers. I connected with  Sara Woods on 12/26/18 by a video enabled telemedicine application and verified that I am speaking with the correct person using two identifiers.   I discussed the limitations of evaluation and management by telemedicine. The patient expressed understanding and agreed to proceed. This visit type is conducted due to national recommendations for restrictions regarding the COVID -19 Pandemic. Due to the patient's age and / or co morbidities, this format is felt to be most appropriate at this time without adequate follow up. The patient has no access to video technology/ had technical difficulties with video, requiring transitioning to audio format  only ( telephone ). All issues noted this document were discussed and addressed,no physical exam can be performed in this format.   Location: Patient: home Provider: office   I discussed the limitations, risks, security and privacy concerns of performing an evaluation and management service by telephone and the availability of in person appointments. I also discussed with the patient that there may be a patient responsible charge related to this service. The patient expressed understanding and agreed to proceed.   History of Present Illness: F/u chronic problems Feels better since surgery , can breathe better, aand will follow up with ENT regularly Denies recent fever or chills. Denies sinus pressure, nasal congestion, ear pain or sore throat. Denies chest congestion, productive cough or wheezing. Denies chest pains, palpitations and leg swelling Denies abdominal pain, nausea, vomiting,diarrhea or constipation.   Denies dysuria, frequency, hesitancy or incontinence. Denies uncontrolled  joint pain, swelling and limitation in mobility. Denies  headaches, seizures, numbness, or tingling. Denies depression, anxiety or insomnia. Denies skin break down or rash.      Observations/Objective:  BP 140/86   Ht 5\' 5"  (1.651 m)   Wt 167 lb (75.8 kg)   BMI 27.79 kg/m  Patient reported Good communication with no confusion and intact memory. Alert and oriented x 3 No signs of respiratory distress during speech   Assessment and Plan:  Essential hypertension Not at goal, needs in office eval DASH diet and commitment to daily physical activity for a minimum of 30 minutes discussed and encouraged, as a part of hypertension management. The importance of attaining a healthy weight is also discussed.  BP/Weight 12/26/2018 11/01/2018 09/08/2018 05/17/2018 04/11/2018 03/22/2018 7/40/8144  Systolic BP 818 563 149 702 637 858 850  Diastolic BP 86 86 86 86 90 85 86  Wt. (Lbs) 167 159.39 167 167 166.12 165 165  BMI 27.79 26.52 27.79 27.79 27.64 27.46 26.63       Migraine with aura Well controlled on prophylactic topamax  Hyperlipidemia LDL goal <100 Hyperlipidemia:Low fat diet discussed and encouraged. Needs to increase exercise to improve HDL Lipid Panel  Lab Results  Component Value Date   CHOL 134 06/22/2018   HDL 49 (L) 06/22/2018   LDLCALC 65 06/22/2018   TRIG 115 06/22/2018   CHOLHDL 2.7 06/22/2018       Back pain with radiation Chronic and unchanged, no change in management  Encounter for chronic pain management The patient's Controlled Substance registry is reviewed and compliance confirmed. Adequacy of  Pain control and level of function is assessed. Medication dosing is adjusted as deemed appropriate. Twelve weeks of medication is prescribed , patient signs for the script and is provided with  a follow up appointment between 11 to 12 weeks .    Follow Up Instructions:    I discussed the assessment and treatment plan with the patient. The patient was provided an opportunity to ask questions and all were  answered. The patient agreed with the plan and demonstrated an understanding of the instructions.   The patient was advised to call back or seek an in-person evaluation if the symptoms worsen or if the condition fails to improve as anticipated.  I provided 21 minutes of non-face-to-face time during this encounter.   Tula Nakayama, MD

## 2019-01-02 ENCOUNTER — Encounter: Payer: Self-pay | Admitting: Family Medicine

## 2019-01-02 NOTE — Assessment & Plan Note (Signed)
The patient's Controlled Substance registry is reviewed and compliance confirmed. Adequacy of  Pain control and level of function is assessed. Medication dosing is adjusted as deemed appropriate. Twelve weeks of medication is prescribed , patient signs for the script and is provided with a follow up appointment between 11 to 12 weeks .  

## 2019-01-02 NOTE — Assessment & Plan Note (Signed)
Not at goal, needs in office eval DASH diet and commitment to daily physical activity for a minimum of 30 minutes discussed and encouraged, as a part of hypertension management. The importance of attaining a healthy weight is also discussed.  BP/Weight 12/26/2018 11/01/2018 09/08/2018 05/17/2018 04/11/2018 03/22/2018 [id number]  Systolic BP XX123456 123456 XX123456 XX123456 [id number] 123456 [id number]  Diastolic BP 86 86 86 86 90 85 86  Wt. (Lbs) 167 159.39 167 167 166.12 165 165  BMI 27.79 26.52 27.79 27.79 27.64 27.46 26.63

## 2019-01-02 NOTE — Assessment & Plan Note (Signed)
Hyperlipidemia:Low fat diet discussed and encouraged. Needs to increase exercise to improve HDL Lipid Panel  Lab Results  Component Value Date   CHOL 134 06/22/2018   HDL 49 (L) 06/22/2018   LDLCALC 65 06/22/2018   TRIG 115 06/22/2018   CHOLHDL 2.7 06/22/2018

## 2019-01-02 NOTE — Assessment & Plan Note (Signed)
Chronic and unchanged, no change in management 

## 2019-01-02 NOTE — Assessment & Plan Note (Signed)
Well controlled on prophylactic topamax

## 2019-01-17 DIAGNOSIS — E785 Hyperlipidemia, unspecified: Secondary | ICD-10-CM | POA: Diagnosis not present

## 2019-01-17 DIAGNOSIS — I10 Essential (primary) hypertension: Secondary | ICD-10-CM | POA: Diagnosis not present

## 2019-01-18 ENCOUNTER — Encounter: Payer: Self-pay | Admitting: Family Medicine

## 2019-01-18 LAB — COMPLETE METABOLIC PANEL WITH GFR
AG Ratio: 1.8 (calc) (ref 1.0–2.5)
ALT: 17 U/L (ref 6–29)
AST: 20 U/L (ref 10–35)
Albumin: 4.1 g/dL (ref 3.6–5.1)
Alkaline phosphatase (APISO): 86 U/L (ref 37–153)
BUN/Creatinine Ratio: 25 (calc) — ABNORMAL HIGH (ref 6–22)
BUN: 24 mg/dL (ref 7–25)
CO2: 24 mmol/L (ref 20–32)
Calcium: 9.9 mg/dL (ref 8.6–10.4)
Chloride: 110 mmol/L (ref 98–110)
Creat: 0.96 mg/dL — ABNORMAL HIGH (ref 0.60–0.93)
GFR, Est African American: 68 mL/min/{1.73_m2} (ref >=60)
GFR, Est Non African American: 59 mL/min/{1.73_m2} — ABNORMAL LOW (ref >=60)
Globulin: 2.3 g/dL (calc) (ref 1.9–3.7)
Glucose, Bld: 89 mg/dL (ref 65–99)
Potassium: 4.2 mmol/L (ref 3.5–5.3)
Sodium: 142 mmol/L (ref 135–146)
Total Bilirubin: 0.6 mg/dL (ref 0.2–1.2)
Total Protein: 6.4 g/dL (ref 6.1–8.1)

## 2019-01-18 LAB — LIPID PANEL
Cholesterol: 184 mg/dL (ref <200)
HDL: 50 mg/dL (ref >=50)
LDL Cholesterol (Calc): 109 mg/dL (calc) — ABNORMAL HIGH
Non-HDL Cholesterol (Calc): 134 mg/dL (calc) — ABNORMAL HIGH (ref <130)
Total CHOL/HDL Ratio: 3.7 (calc) (ref <5.0)
Triglycerides: 141 mg/dL (ref <150)

## 2019-01-24 ENCOUNTER — Other Ambulatory Visit: Payer: Self-pay

## 2019-01-24 ENCOUNTER — Encounter: Payer: Self-pay | Admitting: Family Medicine

## 2019-01-24 ENCOUNTER — Ambulatory Visit (INDEPENDENT_AMBULATORY_CARE_PROVIDER_SITE_OTHER): Payer: Medicare Other | Admitting: Family Medicine

## 2019-01-24 VITALS — BP 136/82 | HR 88 | Temp 97.8°F | Resp 15 | Ht 65.0 in | Wt 160.0 lb

## 2019-01-24 DIAGNOSIS — E785 Hyperlipidemia, unspecified: Secondary | ICD-10-CM

## 2019-01-24 DIAGNOSIS — I10 Essential (primary) hypertension: Secondary | ICD-10-CM

## 2019-01-24 DIAGNOSIS — M549 Dorsalgia, unspecified: Secondary | ICD-10-CM | POA: Diagnosis not present

## 2019-01-24 DIAGNOSIS — G43109 Migraine with aura, not intractable, without status migrainosus: Secondary | ICD-10-CM | POA: Diagnosis not present

## 2019-01-24 DIAGNOSIS — G8929 Other chronic pain: Secondary | ICD-10-CM

## 2019-01-24 DIAGNOSIS — R42 Dizziness and giddiness: Secondary | ICD-10-CM | POA: Diagnosis not present

## 2019-01-24 MED ORDER — KETOROLAC TROMETHAMINE 60 MG/2ML IM SOLN
60.0000 mg | Freq: Once | INTRAMUSCULAR | Status: AC
  Administered 2019-01-24: 60 mg via INTRAMUSCULAR

## 2019-01-24 MED ORDER — DICLOFENAC SODIUM 50 MG PO TBEC: 50.0000 mg | DELAYED_RELEASE_TABLET | Freq: Two times a day (BID) | ORAL | 5 refills | Status: DC

## 2019-01-24 MED ORDER — PREDNISONE 10 MG PO TABS: 10.0000 mg | ORAL_TABLET | Freq: Two times a day (BID) | ORAL | 0 refills | Status: DC

## 2019-01-24 MED ORDER — METHYLPREDNISOLONE ACETATE 80 MG/ML IJ SUSP
80.0000 mg | Freq: Once | INTRAMUSCULAR | Status: AC
  Administered 2019-01-24: 80 mg via INTRAMUSCULAR

## 2019-01-24 NOTE — Progress Notes (Signed)
   Sara Woods     MRN: [id number]      DOB: July 13, 1945   HPI Sara Woods is here for follow up and re-evaluation of chronic medical conditions, medication management and review of any available recent lab and radiology data.  Preventive health is updated, specifically  Cancer screening and Immunization.   Questions or concerns regarding consultations or procedures which the PT has had in the interim are  addressed. The PT denies any adverse reactions to current medications since the last visit.  C/o increased and uncontrolled back pain x 1 week, requests Im injections for pain   ROS Denies recent fever or chills. Denies sinus pressure, nasal congestion, ear pain or sore throat. Denies chest congestion, productive cough or wheezing. Denies chest pains, palpitations and leg swelling Denies abdominal pain, nausea, vomiting,diarrhea or constipation.   Denies dysuria, frequency, hesitancy or incontinence.  Denies uncontrolled  headaches, seizures, numbness, or tingling. Denies depression, anxiety or insomnia. Denies skin break down or rash.   PE  BP 136/82   Pulse 88   Temp 97.8 F (36.6 C) (Temporal)   Resp 15   Ht 5\' 5"  (1.651 m)   Wt 160 lb (72.6 kg)   SpO2 98%   BMI 26.63 kg/m    Patient alert and oriented and in no cardiopulmonary distress.  HEENT: No facial asymmetry, EOMI,   oropharynx pink and moist.  Neck supple no JVD, no mass.  Chest: Clear to auscultation bilaterally.  CVS: S1, S2 no murmurs, no S3.Regular rate.  ABD: Soft non tender.   Ext: No edema  MS: decreased  ROM spine, shoulders, hips and knees.  Skin: Intact, no ulcerations or rash noted.  Psych: Good eye contact, normal affect. Memory intact not anxious or depressed appearing.  CNS: CN 2-12 intact, power,  normal throughout.no focal deficits noted.   Assessment & Plan  Back pain with radiation Uncontrolled.Toradol and depo medrol administered IM in the office , to be followed by a short  course of oral prednisone. Reduce diclofenac intake for renal and heart protection, take eS tylenol up to 2 daily  Encounter for chronic pain management The patient's Controlled Substance registry is reviewed and compliance confirmed. Adequacy of  Pain control and level of function is assessed. Medication dosing is adjusted as deemed appropriate. Twelve weeks of medication is prescribed , patient signs for the script and is provided with a follow up appointment between 11 to 12 weeks .   Hyperlipidemia LDL goal <100 Hyperlipidemia:Low fat diet discussed and encouraged.   Lipid Panel  Lab Results  Component Value Date   CHOL 184 01/17/2019   HDL 50 01/17/2019   LDLCALC 109 (H) 01/17/2019   TRIG 141 01/17/2019   CHOLHDL 3.7 01/17/2019  needs to reduce fried and fatty foods, no med change      Essential hypertension Controlled, no change in medication DASH diet and commitment to daily physical activity for a minimum of 30 minutes discussed and encouraged, as a part of hypertension management. The importance of attaining a healthy weight is also discussed.  BP/Weight 01/24/2019 12/26/2018 11/01/2018 09/08/2018 05/17/2018 04/11/2018 [id number]  Systolic BP [id number] XX123456 123456 XX123456 XX123456 [id number] 123456  Diastolic BP 82 86 86 86 86 90 85  Wt. (Lbs) 160 167 159.39 167 167 166.12 165  BMI 26.63 27.79 26.52 27.79 27.79 27.64 27.46       Migraine with aura Controlled, no change in medication   Vertigo Uses meclizine as needed

## 2019-01-24 NOTE — Patient Instructions (Addendum)
F/U in 4 months, with MD, call if you need me sooner  Please schedule mammogram for February at checkout  Please reduce fried and fatty foods, cheese , butter, egg yolk and red meat, yoour LDL ( bad cholesterol) uis above goal of 99 or less  Please cut back on twice daily diclofenac for protection of kidneys and heart, use ES tylenol up to 2 tablets daily for neck , back and lower extremity pain We will provide samples of tylenol today  CBC,fasting lipid, cmp and EGFR, tSH, 1 week before next visit  Toradol 60 mg and depo Medrol 80 mg Im today for back pain and 5 day course of prednisone precribed  Thanks for choosing [facility], we consider it a privelige to serve you.

## 2019-01-30 ENCOUNTER — Encounter: Payer: Self-pay | Admitting: Family Medicine

## 2019-01-30 NOTE — Assessment & Plan Note (Signed)
Hyperlipidemia:Low fat diet discussed and encouraged.   Lipid Panel  Lab Results  Component Value Date   CHOL 184 01/17/2019   HDL 50 01/17/2019   LDLCALC 109 (H) 01/17/2019   TRIG 141 01/17/2019   CHOLHDL 3.7 01/17/2019  needs to reduce fried and fatty foods, no med change

## 2019-01-30 NOTE — Assessment & Plan Note (Signed)
The patient's Controlled Substance registry is reviewed and compliance confirmed. Adequacy of  Pain control and level of function is assessed. Medication dosing is adjusted as deemed appropriate. Twelve weeks of medication is prescribed , patient signs for the script and is provided with a follow up appointment between 11 to 12 weeks .  

## 2019-01-30 NOTE — Assessment & Plan Note (Signed)
Controlled, no change in medication DASH diet and commitment to daily physical activity for a minimum of 30 minutes discussed and encouraged, as a part of hypertension management. The importance of attaining a healthy weight is also discussed.  BP/Weight 01/24/2019 12/26/2018 11/01/2018 09/08/2018 05/17/2018 04/11/2018 [id number]  Systolic BP [id number] [id number] [id number]  Diastolic BP 82 86 86 86 86 90 85  Wt. (Lbs) 160 167 159.39 167 167 166.12 165  BMI 26.63 27.79 26.52 27.79 27.79 27.64 27.46

## 2019-01-30 NOTE — Assessment & Plan Note (Signed)
Uncontrolled.Toradol and depo medrol administered IM in the office , to be followed by a short course of oral prednisone. Reduce diclofenac intake for renal and heart protection, take eS tylenol up to 2 daily

## 2019-01-30 NOTE — Assessment & Plan Note (Signed)
Controlled, no change in medication  

## 2019-01-30 NOTE — Assessment & Plan Note (Signed)
Uses meclizine as needed

## 2019-03-24 ENCOUNTER — Other Ambulatory Visit: Payer: Self-pay | Admitting: Family Medicine

## 2019-03-28 ENCOUNTER — Ambulatory Visit: Payer: Medicare Other

## 2019-03-28 ENCOUNTER — Other Ambulatory Visit: Payer: Self-pay

## 2019-03-28 ENCOUNTER — Ambulatory Visit (INDEPENDENT_AMBULATORY_CARE_PROVIDER_SITE_OTHER): Payer: Medicare Other | Admitting: Family Medicine

## 2019-03-28 ENCOUNTER — Encounter: Payer: Self-pay | Admitting: Family Medicine

## 2019-03-28 VITALS — BP 136/82 | HR 88 | Ht 65.0 in | Wt 160.0 lb

## 2019-03-28 DIAGNOSIS — Z0000 Encounter for general adult medical examination without abnormal findings: Secondary | ICD-10-CM | POA: Diagnosis not present

## 2019-03-28 NOTE — Patient Instructions (Signed)
[name] , Thank you for taking time to come for your Medicare Wellness Visit. I appreciate your ongoing commitment to your health goals. Please review the following plan we discussed and let me know if I can assist you in the future.   Please continue to practice social distancing to keep you, your family, and our community safe.  If you must go out, please wear a Mask and practice good handwashing.  I hope that you have a happy safe and healthy holiday season. We will see you in the new year.   Screening recommendations/referrals: Colonoscopy: Due 2022 Mammogram: Feb 2021 Bone Density: Up to date Recommended yearly ophthalmology/optometry visit for glaucoma screening and checkup Recommended yearly dental visit for hygiene and checkup  Vaccinations: Influenza vaccine: Completed Pneumococcal vaccine: Up-to-date Tdap vaccine: Due 2021  shingles vaccine: Completed  Advanced directives: You reported that you have this paperwork if you quit your earliest convenience just bring that into the office so that we can make a copy of it thank you so much  Conditions/risks identified: Falls, risk for all of [place] please be mindful of when you are walking you doing really well about not having any falls.  Next appointment: 05/30/2019   Preventive Care 65 Years and Older, Female Preventive care refers to lifestyle choices and visits with your health care provider that can promote health and wellness. What does preventive care include?  A yearly physical exam. This is also called an annual well check.  Dental exams once or twice a year.  Routine eye exams. Ask your health care provider how often you should have your eyes checked.  Personal lifestyle choices, including:  Daily care of your teeth and gums.  Regular physical activity.  Eating a healthy diet.  Avoiding tobacco and drug use.  Limiting alcohol use.  Practicing safe sex.  Taking low-dose aspirin every day.  Taking  vitamin and mineral supplements as recommended by your health care provider. What happens during an annual well check? The services and screenings done by your health care provider during your annual well check will depend on your age, overall health, lifestyle risk factors, and family history of disease. Counseling  Your health care provider may ask you questions about your:  Alcohol use.  Tobacco use.  Drug use.  Emotional well-being.  Home and relationship well-being.  Sexual activity.  Eating habits.  History of falls.  Memory and ability to understand (cognition).  Work and work Statistician.  Reproductive health. Screening  You may have the following tests or measurements:  Height, weight, and BMI.  Blood pressure.  Lipid and cholesterol levels. These may be checked every 5 years, or more frequently if you are over 61 years old.  Skin check.  Lung cancer screening. You may have this screening every year starting at age 22 if you have a 30-pack-year history of smoking and currently smoke or have quit within the past 15 years.  Fecal occult blood test (FOBT) of the stool. You may have this test every year starting at age 24.  Flexible sigmoidoscopy or colonoscopy. You may have a sigmoidoscopy every 5 years or a colonoscopy every 10 years starting at age 52.  Hepatitis C blood test.  Hepatitis B blood test.  Sexually transmitted disease (STD) testing.  Diabetes screening. This is done by checking your blood sugar (glucose) after you have not eaten for a while (fasting). You may have this done every 1-3 years.  Bone density scan. This is done to screen  for osteoporosis. You may have this done starting at age 91.  Mammogram. This may be done every 1-2 years. Talk to your health care provider about how often you should have regular mammograms. Talk with your health care provider about your test results, treatment options, and if necessary, the need for more  tests. Vaccines  Your health care provider may recommend certain vaccines, such as:  Influenza vaccine. This is recommended every year.  Tetanus, diphtheria, and acellular pertussis (Tdap, Td) vaccine. You may need a Td booster every 10 years.  Zoster vaccine. You may need this after age 63.  Pneumococcal 13-valent conjugate (PCV13) vaccine. One dose is recommended after age 15.  Pneumococcal polysaccharide (PPSV23) vaccine. One dose is recommended after age 64. Talk to your health care provider about which screenings and vaccines you need and how often you need them. This information is not intended to replace advice given to you by your health care provider. Make sure you discuss any questions you have with your health care provider. Document Released: 05/24/2015 Document Revised: 01/15/2016 Document Reviewed: 02/26/2015 Elsevier Interactive Patient Education  2017 [place] Prevention in the Home Falls can cause injuries. They can happen to people of all ages. There are many things you can do to make your home safe and to help prevent falls. What can I do on the outside of my home?  Regularly fix the edges of walkways and driveways and fix any cracks.  Remove anything that might make you trip as you walk through a door, such as a raised step or threshold.  Trim any bushes or trees on the path to your home.  Use bright outdoor lighting.  Clear any walking paths of anything that might make someone trip, such as rocks or tools.  Regularly check to see if handrails are loose or broken. Make sure that both sides of any steps have handrails.  Any raised decks and porches should have guardrails on the edges.  Have any leaves, snow, or ice cleared regularly.  Use sand or salt on walking paths during winter.  Clean up any spills in your garage right away. This includes oil or grease spills. What can I do in the bathroom?  Use night lights.  Install grab bars by the  toilet and in the tub and shower. Do not use towel bars as grab bars.  Use non-skid mats or decals in the tub or shower.  If you need to sit down in the shower, use a plastic, non-slip stool.  Keep the floor dry. Clean up any water that spills on the floor as soon as it happens.  Remove soap buildup in the tub or shower regularly.  Attach bath mats securely with double-sided non-slip rug tape.  Do not have throw rugs and other things on the floor that can make you trip. What can I do in the bedroom?  Use night lights.  Make sure that you have a light by your bed that is easy to reach.  Do not use any sheets or blankets that are too big for your bed. They should not hang down onto the floor.  Have a firm chair that has side arms. You can use this for support while you get dressed.  Do not have throw rugs and other things on the floor that can make you trip. What can I do in the kitchen?  Clean up any spills right away.  Avoid walking on wet floors.  Keep items that you  use a lot in easy-to-reach places.  If you need to reach something above you, use a strong step stool that has a grab bar.  Keep electrical cords out of the way.  Do not use floor polish or wax that makes floors slippery. If you must use wax, use non-skid floor wax.  Do not have throw rugs and other things on the floor that can make you trip. What can I do with my stairs?  Do not leave any items on the stairs.  Make sure that there are handrails on both sides of the stairs and use them. Fix handrails that are broken or loose. Make sure that handrails are as long as the stairways.  Check any carpeting to make sure that it is firmly attached to the stairs. Fix any carpet that is loose or worn.  Avoid having throw rugs at the top or bottom of the stairs. If you do have throw rugs, attach them to the floor with carpet tape.  Make sure that you have a light switch at the top of the stairs and the bottom of  the stairs. If you do not have them, ask someone to add them for you. What else can I do to help prevent falls?  Wear shoes that:  Do not have high heels.  Have rubber bottoms.  Are comfortable and fit you well.  Are closed at the toe. Do not wear sandals.  If you use a stepladder:  Make sure that it is fully opened. Do not climb a closed stepladder.  Make sure that both sides of the stepladder are locked into place.  Ask someone to hold it for you, if possible.  Clearly mark and make sure that you can see:  Any grab bars or handrails.  First and last steps.  Where the edge of each step is.  Use tools that help you move around (mobility aids) if they are needed. These include:  Canes.  Walkers.  Scooters.  Crutches.  Turn on the lights when you go into a dark area. Replace any light bulbs as soon as they burn out.  Set up your furniture so you have a clear path. Avoid moving your furniture around.  If any of your floors are uneven, fix them.  If there are any pets around you, be aware of where they are.  Review your medicines with your doctor. Some medicines can make you feel dizzy. This can increase your chance of falling. Ask your doctor what other things that you can do to help prevent falls. This information is not intended to replace advice given to you by your health care provider. Make sure you discuss any questions you have with your health care provider. Document Released: 02/21/2009 Document Revised: 10/03/2015 Document Reviewed: 06/01/2014 Elsevier Interactive Patient Education  2017 [employer].

## 2019-03-28 NOTE — Progress Notes (Signed)
Subjective:   Sara Woods is a 73 y.o. female who presents for Medicare Annual (Subsequent) preventive examination.  Location of Patient: Home Location of Provider: Telehealth Consent was obtain for visit to be over via telehealth.  I verified that I am speaking with the correct person using two identifiers.    Review of Systems:   Cardiac Risk Factors include: advanced age (>75men, >2 women);dyslipidemia;hypertension     Objective:     Vitals: There were no vitals taken for this visit.  There is no height or weight on file to calculate BMI.  Advanced Directives 03/28/2019 11/01/2018 10/26/2018 03/22/2018 01/06/2017  Does Patient Have a Medical Advance Directive? No;Yes Yes Yes Yes;No No  Type of Advance Directive Living will;Healthcare Power of Attorney Living will Living will - -  Does patient want to make changes to medical advance directive? - No - Patient declined No - Patient declined No - Patient declined -  Copy of [place] in Chart? - No - copy requested No - copy requested - -  Would patient like information on creating a medical advance directive? - - - Yes (ED - Information included in AVS) No - Patient declined    Tobacco Social History   Tobacco Use  Smoking Status Never Smoker  Smokeless Tobacco Never Used     Counseling given: Not Answered   Clinical Intake:  Pre-visit preparation completed: No  Pain : No/denies pain     Diabetes: No  How often do you need to have someone help you when you read instructions, pamphlets, or other written materials from your doctor or pharmacy?: 1 - Never What is the last grade level you completed in school?: 14  Interpreter Needed?: No     Past Medical History:  Diagnosis Date  . Allergic rhinitis   . Anxiety   . Arthritis   . Depression approx 1983  . History of migraines   . Hyperlipidemia approx 2006  . Hypertension approx 2000  . MRSA infection    Recurrent abcesses to legs  about 8 years ago  . Recurrent miscarriages due to luteal phase defect, not pregnant    5 miscarriages with 1 living adult son   Past Surgical History:  Procedure Laterality Date  . BREAST EXCISIONAL BIOPSY Left   . ETHMOIDECTOMY Right 11/01/2018   Procedure: RIGHT TOTAL ETHMOIDECTOMY;  Surgeon: Leta Baptist, MD;  Location: [facility];  Service: ENT;  Laterality: Right;  . FRONTAL SINUS EXPLORATION Right 11/01/2018   Procedure: FRONTAL SINUS EXPLORATION;  Surgeon: Leta Baptist, MD;  Location: [facility];  Service: ENT;  Laterality: Right;  . MAXILLARY ANTROSTOMY Right 11/01/2018   Procedure: RIGHT ENDOSCOPIC MAXILLARY ANTROSTOMY WITH TISSUE REMOVAL;  Surgeon: Leta Baptist, MD;  Location: [facility];  Service: ENT;  Laterality: Right;  . NASAL SEPTOPLASTY W/ TURBINOPLASTY N/A 11/01/2018   Procedure: NASAL SEPTOPLASTY WITH TURBINATE REDUCTION;  Surgeon: Leta Baptist, MD;  Location: [facility];  Service: ENT;  Laterality: N/A;  . SINUS ENDO WITH FUSION N/A 11/01/2018   Procedure: SINUS ENDO WITH FUSION;  Surgeon: Leta Baptist, MD;  Location: [facility];  Service: ENT;  Laterality: N/A;  . SKIN CANCER DESTRUCTION     Back of left leg, right shoulder  . SPHENOIDECTOMY Right 11/01/2018   Procedure: RIGHT SPHENOIDECTOMY WITH TISSUE REMOVAL;  Surgeon: Leta Baptist, MD;  Location: [facility];  Service: ENT;  Laterality: Right;  . TONSILLECTOMY  Childhood  . TUBAL LIGATION    . TUMOR EXCISION     Tongue   Family History  Problem Relation Age of Onset  . Prostate cancer Father        metastic, 04/2006  . Obesity Sister   . Fibromyalgia Sister   . Osteoporosis Mother   . Cerebrovascular Disease Mother   . Brain cancer Mother   . Cerebrovascular Disease Brother   . Stroke Son        x3, Vertebral artery dissection, January 2009  . Hypertension Son    Social History   Socioeconomic History  . Marital status: Married     Spouse name: Not on file  . Number of children: 1  . Years of education: 12+  . Highest education level: 12th grade  Occupational History  . Occupation: retired   Scientific laboratory technician  . Financial resource strain: Not hard at all  . Food insecurity    Worry: Never true    Inability: Never true  . Transportation needs    Medical: No    Non-medical: No  Tobacco Use  . Smoking status: Never Smoker  . Smokeless tobacco: Never Used  Substance and Sexual Activity  . Alcohol use: No  . Drug use: No  . Sexual activity: Not Currently    Birth control/protection: Post-menopausal  Lifestyle  . Physical activity    Days per week: 2 days    Minutes per session: 30 min  . Stress: Only a little  Relationships  . Social Herbalist on phone: Three times a week    Gets together: Never    Attends religious service: Never    Active member of club or organization: No    Attends meetings of clubs or organizations: Never    Relationship status: Married  Other Topics Concern  . Not on file  Social History Narrative   Married, lives with spouse   Spouse admitted for lumbar surgery          Outpatient Encounter Medications as of 03/28/2019  Medication Sig  . Calcium-Vitamin D (CALTRATE 600 PLUS-VIT D PO) Take 2 tablets by mouth daily.   . diclofenac (VOLTAREN) 50 MG EC tablet Take 1 tablet (50 mg total) by mouth 2 (two) times daily.  Marland Kitchen doxepin (SINEQUAN) 10 MG capsule Take 2 capsules (20 mg total) by mouth at bedtime.  . gabapentin (NEURONTIN) 300 MG capsule Take 1 capsule (300 mg total) by mouth at bedtime.  Marland Kitchen HYDROcodone-acetaminophen (NORCO/VICODIN) 5-325 MG tablet Take one tablet by mouth once daily for uncontrolled back pain  . KRILL OIL 1000 MG CAPS Take 1 capsule by mouth daily.   Marland Kitchen lisinopril (ZESTRIL) 40 MG tablet Take 1 tablet (40 mg total) by mouth daily.  . meclizine (ANTIVERT) 25 MG tablet TAKE ONE TABLET BY MOUTH THREE TIMES DAILY AS NEEDED FOR DIZZINESS  . multivitamin  (THERAGRAN) per tablet Take 1 tablet by mouth daily.   . predniSONE (DELTASONE) 10 MG tablet Take 1 tablet (10 mg total) by mouth 2 (two) times daily with a meal.  . rosuvastatin (CRESTOR) 40 MG tablet TAKE 1 TABLET BY MOUTH DAILY  . spironolactone (ALDACTONE) 25 MG tablet Take 1 tablet (25 mg total) by mouth daily.  Marland Kitchen topiramate (TOPAMAX) 50 MG tablet Take 1 tablet (50 mg total) by mouth 2 (two) times daily.   No facility-administered encounter medications on file as of 03/28/2019.     Activities of Daily Living In your present state of  health, do you have any difficulty performing the following activities: 03/28/2019 11/01/2018  Hearing? N N  Vision? N N  Comment glasses -  Difficulty concentrating or making decisions? N N  Walking or climbing stairs? N N  Dressing or bathing? N N  Doing errands, shopping? N -  Some recent data might be hidden    Patient Care Team: Fayrene Helper, MD as PCP - Danella Sensing, MD as Referring Physician (Unknown Physician Specialty)    Assessment:   This is a routine wellness examination for Taler.  Exercise Activities and Dietary recommendations Current Exercise Habits: Home exercise routine, Type of exercise: walking;treadmill, Time (Minutes): 25, Frequency (Times/Week): 2, Weekly Exercise (Minutes/Week): 50, Intensity: Mild, Exercise limited by: cardiac condition(s)  Goals    . DIET - EAT MORE FRUITS AND VEGETABLES    . Exercise 3x per week (30 min per time)     Recommend starting a routine exercise program at least 3 days a week for 30-45 minutes at a time as tolerated.         Fall Risk Fall Risk  03/28/2019 01/24/2019 09/08/2018 05/17/2018 04/11/2018  Falls in the past year? 0 0 0 0 0  Number falls in past yr: 0 0 0 - -  Injury with Fall? 0 0 0 - -  Risk Factor Category  - - - - -  Risk for fall due to : - - - - -  Follow up Falls evaluation completed;Education provided;Falls prevention discussed - - - -   Is the  patient's home free of loose throw rugs in walkways, pet beds, electrical cords, etc?   yes      Grab bars in the bathroom? yes      Handrails on the stairs?   yes      Adequate lighting?   yes  Depression Screen PHQ 2/9 Scores 03/28/2019 05/17/2018 04/11/2018 03/22/2018  PHQ - 2 Score 0 0 0 0  PHQ- 9 Score - 0 2 -     Cognitive Function     6CIT Screen 03/22/2018 01/06/2017  What Year? 0 points 0 points  What month? 0 points 0 points  What time? 0 points 0 points  Count back from 20 0 points 0 points  Months in reverse 0 points 0 points  Repeat phrase 0 points 0 points  Total Score 0 0    Immunization History  Administered Date(s) Administered  . H1N1 04/19/2008  . Influenza Split 01/30/2011, 02/24/2012  . Influenza Whole 02/23/2007, 02/23/2008  . Influenza,inj,Quad PF,6+ Mos 02/27/2013, 04/19/2014, 02/26/2015, 03/23/2016, 01/06/2017, 12/23/2017  . Pneumococcal Conjugate-13 12/06/2013  . Pneumococcal Polysaccharide-23 12/19/2010  . Td 02/24/2010  . Zoster 12/19/2010    Qualifies for Shingles Vaccine? completed  Screening Tests Health Maintenance  Topic Date Due  . MAMMOGRAM  06/17/2019  . TETANUS/TDAP  02/25/2020  . COLONOSCOPY  08/04/2020  . INFLUENZA VACCINE  Completed  . DEXA SCAN  Completed  . Hepatitis C Screening  Completed  . PNA vac Low Risk Adult  Completed    Cancer Screenings: Lung: Low Dose CT Chest recommended if Age 44-80 years, 30 pack-year currently smoking OR have quit w/in 15years. Patient does not qualify. Breast:  Up to date on Mammogram? Yes   Up to date of Bone Density/Dexa? Yes Colorectal: Due 2022  Additional Screenings: : Hepatitis C Screening: completed     Plan:      1. Encounter for Medicare annual wellness exam   I have personally reviewed  and noted the following in the patient's chart:   . Medical and social history . Use of alcohol, tobacco or illicit drugs  . Current medications and supplements . Functional ability  and status . Nutritional status . Physical activity . Advanced directives . List of other physicians . Hospitalizations, surgeries, and ER visits in previous 12 months . Vitals . Screenings to include cognitive, depression, and falls . Referrals and appointments  In addition, I have reviewed and discussed with patient certain preventive protocols, quality metrics, and best practice recommendations. A written personalized care plan for preventive services as well as general preventive health recommendations were provided to patient.    I provided 20 minutes of non-face-to-face time during this encounter.    Perlie Mayo, NP  03/28/2019

## 2019-04-10 ENCOUNTER — Ambulatory Visit: Payer: Medicare Other

## 2019-04-13 ENCOUNTER — Ambulatory Visit (INDEPENDENT_AMBULATORY_CARE_PROVIDER_SITE_OTHER): Payer: Medicare Other | Admitting: Otolaryngology

## 2019-04-23 ENCOUNTER — Other Ambulatory Visit: Payer: Self-pay | Admitting: Family Medicine

## 2019-05-11 ENCOUNTER — Other Ambulatory Visit: Payer: Self-pay | Admitting: Family Medicine

## 2019-05-22 ENCOUNTER — Other Ambulatory Visit: Payer: Self-pay | Admitting: Family Medicine

## 2019-05-23 DIAGNOSIS — E785 Hyperlipidemia, unspecified: Secondary | ICD-10-CM | POA: Diagnosis not present

## 2019-05-23 DIAGNOSIS — I10 Essential (primary) hypertension: Secondary | ICD-10-CM | POA: Diagnosis not present

## 2019-05-23 LAB — LIPID PANEL
Cholesterol: 146 mg/dL (ref <200)
HDL: 57 mg/dL (ref >=50)
LDL Cholesterol (Calc): 70 mg/dL (calc)
Non-HDL Cholesterol (Calc): 89 mg/dL (calc) (ref <130)
Total CHOL/HDL Ratio: 2.6 (calc) (ref <5.0)
Triglycerides: 105 mg/dL (ref <150)

## 2019-05-23 LAB — COMPLETE METABOLIC PANEL WITH GFR
AG Ratio: 1.8 (calc) (ref 1.0–2.5)
ALT: 24 U/L (ref 6–29)
AST: 22 U/L (ref 10–35)
Albumin: 4.4 g/dL (ref 3.6–5.1)
Alkaline phosphatase (APISO): 86 U/L (ref 37–153)
BUN/Creatinine Ratio: 30 (calc) — ABNORMAL HIGH (ref 6–22)
BUN: 31 mg/dL — ABNORMAL HIGH (ref 7–25)
CO2: 25 mmol/L (ref 20–32)
Calcium: 10.1 mg/dL (ref 8.6–10.4)
Chloride: 110 mmol/L (ref 98–110)
Creat: 1.05 mg/dL — ABNORMAL HIGH (ref 0.60–0.93)
GFR, Est African American: 61 mL/min/{1.73_m2} (ref >=60)
GFR, Est Non African American: 52 mL/min/{1.73_m2} — ABNORMAL LOW (ref >=60)
Globulin: 2.4 g/dL (calc) (ref 1.9–3.7)
Glucose, Bld: 92 mg/dL (ref 65–99)
Potassium: 4.1 mmol/L (ref 3.5–5.3)
Sodium: 143 mmol/L (ref 135–146)
Total Bilirubin: 0.5 mg/dL (ref 0.2–1.2)
Total Protein: 6.8 g/dL (ref 6.1–8.1)

## 2019-05-23 LAB — TSH: TSH: 2.49 mIU/L (ref 0.40–4.50)

## 2019-05-24 ENCOUNTER — Encounter: Payer: Self-pay | Admitting: Family Medicine

## 2019-05-30 ENCOUNTER — Ambulatory Visit: Payer: Medicare Other | Admitting: Family Medicine

## 2019-05-31 ENCOUNTER — Other Ambulatory Visit: Payer: Self-pay

## 2019-05-31 ENCOUNTER — Encounter: Payer: Self-pay | Admitting: Family Medicine

## 2019-05-31 ENCOUNTER — Ambulatory Visit (INDEPENDENT_AMBULATORY_CARE_PROVIDER_SITE_OTHER): Payer: Medicare Other | Admitting: Family Medicine

## 2019-05-31 VITALS — BP 136/82 | Ht 65.0 in | Wt 160.0 lb

## 2019-05-31 DIAGNOSIS — G43109 Migraine with aura, not intractable, without status migrainosus: Secondary | ICD-10-CM | POA: Diagnosis not present

## 2019-05-31 DIAGNOSIS — J309 Allergic rhinitis, unspecified: Secondary | ICD-10-CM

## 2019-05-31 DIAGNOSIS — M549 Dorsalgia, unspecified: Secondary | ICD-10-CM

## 2019-05-31 DIAGNOSIS — E785 Hyperlipidemia, unspecified: Secondary | ICD-10-CM

## 2019-05-31 DIAGNOSIS — I10 Essential (primary) hypertension: Secondary | ICD-10-CM | POA: Diagnosis not present

## 2019-05-31 DIAGNOSIS — G8929 Other chronic pain: Secondary | ICD-10-CM | POA: Diagnosis not present

## 2019-05-31 MED ORDER — FLUTICASONE PROPIONATE 50 MCG/ACT NA SUSP: 2.0000 | Freq: Every day | NASAL | 6 refills | Status: DC

## 2019-05-31 MED ORDER — HYDROCODONE-ACETAMINOPHEN 5-325 MG PO TABS: ORAL_TABLET | ORAL | 0 refills | Status: DC

## 2019-05-31 MED ORDER — GABAPENTIN 300 MG PO CAPS: ORAL_CAPSULE | ORAL | 5 refills | Status: DC

## 2019-05-31 NOTE — Progress Notes (Signed)
Virtual Visit via Telephone Note  I connected with Richarda Overlie on 05/31/19 at  2:20 PM EST by telephone and verified that I am speaking with the correct person using two identifiers.  Location: Patient: home  Provider: office   I discussed the limitations, risks, security and privacy concerns of performing an evaluation and management service by telephone and the availability of in person appointments. I also discussed with the patient that there may be a patient responsible charge related to this service. The patient expressed understanding and agreed to proceed.   History of Present Illness:   F/U chronic problems, medication review, and refill medication when necessary. Review most recent labs and order labs which are due Review preventive health and update with necessary referrals or immunizations as indicated Denies recent fever or chills. Denies sinus pressure, nasal congestion, ear pain or sore throat. Denies chest congestion, productive cough or wheezing. Denies chest pains, palpitations and leg swelling Denies abdominal pain, nausea, vomiting,diarrhea or constipation.   Denies dysuria, frequency, hesitancy or incontinence. C/o increased back pain and stiffness, requests dose increase in gabapentin,  No change in hydrocodone dependence Denies headaches, seizures, numbness, or tingling. Denies depression, anxiety or insomnia.Has had recent l;ossesin family members, but coping well Denies skin break down or rash.     Observations/Objective: BP 136/82   Ht 5\' 5"  (1.651 m)   Wt 160 lb (72.6 kg)   BMI 26.63 kg/m  Good communication with no confusion and intact memory. Alert and oriented x 3 No signs of respiratory distress during speech    Assessment and Plan: Essential hypertension Controlled, no change in medication   Encounter for chronic pain management The patient's Controlled Substance registry is reviewed and compliance confirmed. Adequacy of  Pain  control and level of function is assessed. Medication dosing is adjusted as deemed appropriate. Twelve weeks of medication is prescribed , patient signs for the script and is provided with a follow up appointment between 11 to 12 weeks .   Back pain with radiation Increased symptoms, increase gabapentin dose  Hyperlipidemia LDL goal <100 Hyperlipidemia:Low fat diet discussed and encouraged.   Lipid Panel  Lab Results  Component Value Date   CHOL 146 05/23/2019   HDL 57 05/23/2019   LDLCALC 70 05/23/2019   TRIG 105 05/23/2019   CHOLHDL 2.6 05/23/2019  Controlled, no change in medication      Migraine with aura Controlled, no change in medication   Allergic rhinitis Controlled, no change in medication     Follow Up Instructions:    I discussed the assessment and treatment plan with the patient. The patient was provided an opportunity to ask questions and all were answered. The patient agreed with the plan and demonstrated an understanding of the instructions.   The patient was advised to call back or seek an in-person evaluation if the symptoms worsen or if the condition fails to improve as anticipated.  I provided 12 minutes of non-face-to-face time during this encounter.   Tula Nakayama, MD

## 2019-05-31 NOTE — Patient Instructions (Signed)
F/U with MD in 4 months, call if you need me sooner  Please start flonase for allergies, and  Reduce Afrin to 3 day stretches, over use causes rebound nasal congestion  Dose of gabapentin has been adjusted as discussed.  My condolence re your recent loss  Please increase water intake , recent labs show mild dehydration  Thanks for choosing [facility], we consider it a privelige to serve you.

## 2019-06-04 ENCOUNTER — Encounter: Payer: Self-pay | Admitting: Family Medicine

## 2019-06-04 NOTE — Assessment & Plan Note (Signed)
The patient's Controlled Substance registry is reviewed and compliance confirmed. Adequacy of  Pain control and level of function is assessed. Medication dosing is adjusted as deemed appropriate. Twelve weeks of medication is prescribed , patient signs for the script and is provided with a follow up appointment between 11 to 12 weeks .  

## 2019-06-04 NOTE — Assessment & Plan Note (Signed)
Hyperlipidemia:Low fat diet discussed and encouraged.   Lipid Panel  Lab Results  Component Value Date   CHOL 146 05/23/2019   HDL 57 05/23/2019   LDLCALC 70 05/23/2019   TRIG 105 05/23/2019   CHOLHDL 2.6 05/23/2019  Controlled, no change in medication

## 2019-06-04 NOTE — Assessment & Plan Note (Signed)
Controlled, no change in medication  

## 2019-06-04 NOTE — Assessment & Plan Note (Signed)
Increased symptoms, increase gabapentin dose

## 2019-06-19 ENCOUNTER — Ambulatory Visit (HOSPITAL_COMMUNITY): Payer: Medicare Other

## 2019-07-17 DIAGNOSIS — Z23 Encounter for immunization: Secondary | ICD-10-CM | POA: Diagnosis not present

## 2019-08-04 DIAGNOSIS — Z23 Encounter for immunization: Secondary | ICD-10-CM | POA: Diagnosis not present

## 2019-09-04 ENCOUNTER — Other Ambulatory Visit: Payer: Self-pay | Admitting: Family Medicine

## 2019-09-04 DIAGNOSIS — Z1231 Encounter for screening mammogram for malignant neoplasm of breast: Secondary | ICD-10-CM

## 2019-09-11 ENCOUNTER — Ambulatory Visit (HOSPITAL_COMMUNITY): Payer: Medicare Other

## 2019-09-15 ENCOUNTER — Other Ambulatory Visit: Payer: Self-pay | Admitting: Family Medicine

## 2019-09-17 ENCOUNTER — Other Ambulatory Visit: Payer: Self-pay | Admitting: Family Medicine

## 2019-09-18 ENCOUNTER — Other Ambulatory Visit: Payer: Self-pay | Admitting: Family Medicine

## 2019-09-28 ENCOUNTER — Ambulatory Visit: Payer: Medicare Other | Admitting: Family Medicine

## 2019-10-08 ENCOUNTER — Other Ambulatory Visit: Payer: Self-pay | Admitting: Family Medicine

## 2019-10-16 ENCOUNTER — Other Ambulatory Visit: Payer: Self-pay | Admitting: Family Medicine

## 2019-10-16 ENCOUNTER — Telehealth: Payer: Self-pay

## 2019-10-16 ENCOUNTER — Other Ambulatory Visit: Payer: Self-pay

## 2019-10-16 MED ORDER — DOXEPIN HCL 10 MG PO CAPS: 20.0000 mg | ORAL_CAPSULE | Freq: Every day | ORAL | 5 refills | Status: DC

## 2019-10-16 NOTE — Telephone Encounter (Signed)
Pt LVM that she needs to talk to someone regarding a prescription

## 2019-10-16 NOTE — Telephone Encounter (Signed)
Patient stated her doxepin was denied but chart showed it was refilled today with 5 refills. Advised patient of same.

## 2019-11-08 ENCOUNTER — Other Ambulatory Visit: Payer: Self-pay | Admitting: Family Medicine

## 2019-11-14 ENCOUNTER — Other Ambulatory Visit: Payer: Self-pay | Admitting: Family Medicine

## 2019-11-17 ENCOUNTER — Other Ambulatory Visit: Payer: Self-pay | Admitting: Family Medicine

## 2019-11-20 ENCOUNTER — Other Ambulatory Visit: Payer: Self-pay | Admitting: Family Medicine

## 2019-11-27 ENCOUNTER — Ambulatory Visit (HOSPITAL_COMMUNITY): Payer: Medicare Other

## 2019-11-29 ENCOUNTER — Other Ambulatory Visit: Payer: Self-pay

## 2019-11-29 ENCOUNTER — Ambulatory Visit (HOSPITAL_COMMUNITY)
Admission: RE | Admit: 2019-11-29 | Discharge: 2019-11-29 | Disposition: A | Discharge status: Home / Self Care | Payer: Medicare Other | Source: Ambulatory Visit | Attending: Family Medicine | Admitting: Family Medicine

## 2019-11-29 DIAGNOSIS — Z1231 Encounter for screening mammogram for malignant neoplasm of breast: Secondary | ICD-10-CM

## 2019-12-28 ENCOUNTER — Encounter: Payer: Self-pay | Admitting: Family Medicine

## 2019-12-28 ENCOUNTER — Telehealth (INDEPENDENT_AMBULATORY_CARE_PROVIDER_SITE_OTHER): Payer: Medicare Other | Admitting: Family Medicine

## 2019-12-28 ENCOUNTER — Other Ambulatory Visit: Payer: Self-pay

## 2019-12-28 VITALS — BP 136/82 | Ht 65.0 in | Wt 160.0 lb

## 2019-12-28 DIAGNOSIS — G8929 Other chronic pain: Secondary | ICD-10-CM | POA: Diagnosis not present

## 2019-12-28 DIAGNOSIS — I10 Essential (primary) hypertension: Secondary | ICD-10-CM

## 2019-12-28 DIAGNOSIS — E785 Hyperlipidemia, unspecified: Secondary | ICD-10-CM | POA: Diagnosis not present

## 2019-12-28 DIAGNOSIS — M549 Dorsalgia, unspecified: Secondary | ICD-10-CM

## 2019-12-28 DIAGNOSIS — F418 Other specified anxiety disorders: Secondary | ICD-10-CM

## 2019-12-28 MED ORDER — HYDROCODONE-ACETAMINOPHEN 5-325 MG PO TABS: ORAL_TABLET | ORAL | 0 refills | Status: DC

## 2019-12-28 NOTE — Progress Notes (Signed)
Virtual Visit via Telephone Note  I connected with Richarda Overlie on 12/28/19 at  1:20 PM EDT by telephone and verified that I am speaking with the correct person using two identifiers.  Location: Patient: home  Provider: office   I discussed the limitations, risks, security and privacy concerns of performing an evaluation and management service by telephone and the availability of in person appointments. I also discussed with the patient that there may be a patient responsible charge related to this service. The patient expressed understanding and agreed to proceed.   History of Present Illness: F/u chronic problems. No new medical  complaints or concerns, states she ius doing well overall, just mild allergy symptoms intermittently Review of Systems  Constitutional: Negative for chills and fever.  Respiratory: Negative for sputum production.   Cardiovascular: Negative for chest pain, palpitations and leg swelling.  Gastrointestinal: Positive for constipation and diarrhea. Negative for heartburn, nausea and vomiting.  Genitourinary: Negative for dysuria, frequency, hematuria and urgency.  Musculoskeletal: Positive for back pain.       Wants to remain on current pain control regime , has 1 week h/o flare of back pain and requests anti inflammatory injections  Neurological: Positive for headaches.       Controlled on medication  Psychiatric/Behavioral: Negative for depression, hallucinations, substance abuse and suicidal ideas. The patient has insomnia. The patient is not nervous/anxious.        Controlled on medication       Observations/Objective: BP 136/82   Ht 5\' 5"  (1.651 m)   Wt 160 lb (72.6 kg)   BMI 26.63 kg/m    Assessment and Plan: Essential hypertension Controlled, no change in medication DASH diet and commitment to daily physical activity for a minimum of 30 minutes discussed and encouraged, as a part of hypertension management. The importance of attaining a  healthy weight is also discussed.  BP/Weight 12/28/2019 05/31/2019 03/28/2019 01/24/2019 12/26/2018 11/01/2018 2/45/8099  Systolic BP 833 825 053 976 734 193 790  Diastolic BP 82 82 82 82 86 86 86  Wt. (Lbs) 160 160 160 160 167 159.39 167  BMI 26.63 26.63 26.63 26.63 27.79 26.52 27.79       Hyperlipidemia LDL goal <100 Hyperlipidemia:Low fat diet discussed and encouraged.   Lipid Panel  Lab Results  Component Value Date   CHOL 146 05/23/2019   HDL 57 05/23/2019   LDLCALC 70 05/23/2019   TRIG 105 05/23/2019   CHOLHDL 2.6 05/23/2019   Updated lab needed at/ before next visit.     Back pain with radiation curently uncontrolled, will come in the next 1 week for toradol 60 mg IM and depo Medrol 80 mg IM , also flu vaccine at that time  Depression with anxiety Controlled, no change in medication   Encounter for chronic pain management The patient's Controlled Substance registry is reviewed and compliance confirmed. Adequacy of  Pain control and level of function is assessed. Medication dosing is adjusted as deemed appropriate.sixteen  weeks of medication is prescribed , with a follow up appointment between 16 weeks .     Follow Up Instructions:    I discussed the assessment and treatment plan with the patient. The patient was provided an opportunity to ask questions and all were answered. The patient agreed with the plan and demonstrated an understanding of the instructions.   The patient was advised to call back or seek an in-person evaluation if the symptoms worsen or if the condition fails to improve as anticipated.  I provided 20 minutes of non-face-to-face time during this encounter.   Tula Nakayama, MD

## 2019-12-28 NOTE — Patient Instructions (Addendum)
F/u in January, call if you need me before  Thankful that you are doing well overall,   Nurse will call and arrange day and time next week for you to come and get flu vaccine, as well as Toradol 60 mg IM and depo medrol 80 mg iM for your back pain  Appointment for flu vaccine will also be arranged for your husband , Alvester Chou   Fasting lipid, cmp and eGFR, CBC and vit d as soon as possible please  It is important that you exercise regularly at least 30 minutes 5 times a week. If you develop chest pain, have severe difficulty breathing, or feel very tired, stop exercising immediately and seek medical attention  Think about what you will eat, plan ahead. Choose " clean, green, fresh or frozen" over canned, processed or packaged foods which are more sugary, salty and fatty. 70 to 75% of food eaten should be vegetables and fruit. Three meals at set times with snacks allowed between meals, but they must be fruit or vegetables. Aim to eat over a 12 hour period , example 7 am to 7 pm, and STOP after  your last meal of the day. Drink water,generally about 64 ounces per day, no other drink is as healthy. Fruit juice is best enjoyed in a healthy way, by EATING the fruit. Thanks for choosing [facility], we consider it a privelige to serve you.

## 2019-12-31 NOTE — Assessment & Plan Note (Signed)
curently uncontrolled, will come in the next 1 week for toradol 60 mg IM and depo Medrol 80 mg IM , also flu vaccine at that time

## 2019-12-31 NOTE — Assessment & Plan Note (Signed)
Hyperlipidemia:Low fat diet discussed and encouraged.   Lipid Panel  Lab Results  Component Value Date   CHOL 146 05/23/2019   HDL 57 05/23/2019   LDLCALC 70 05/23/2019   TRIG 105 05/23/2019   CHOLHDL 2.6 05/23/2019   Updated lab needed at/ before next visit.

## 2019-12-31 NOTE — Assessment & Plan Note (Signed)
The patient's Controlled Substance registry is reviewed and compliance confirmed. Adequacy of  Pain control and level of function is assessed. Medication dosing is adjusted as deemed appropriate.sixteen  weeks of medication is prescribed , with a follow up appointment between 16 weeks .

## 2019-12-31 NOTE — Assessment & Plan Note (Signed)
Controlled, no change in medication  

## 2019-12-31 NOTE — Assessment & Plan Note (Signed)
Controlled, no change in medication DASH diet and commitment to daily physical activity for a minimum of 30 minutes discussed and encouraged, as a part of hypertension management. The importance of attaining a healthy weight is also discussed.  BP/Weight 12/28/2019 05/31/2019 03/28/2019 01/24/2019 12/26/2018 11/01/2018 6/70/1100  Systolic BP 349 611 643 539 122 583 462  Diastolic BP 82 82 82 82 86 86 86  Wt. (Lbs) 160 160 160 160 167 159.39 167  BMI 26.63 26.63 26.63 26.63 27.79 26.52 27.79

## 2020-01-08 ENCOUNTER — Other Ambulatory Visit: Payer: Self-pay | Admitting: Family Medicine

## 2020-01-11 DIAGNOSIS — Z23 Encounter for immunization: Secondary | ICD-10-CM | POA: Diagnosis not present

## 2020-03-07 ENCOUNTER — Other Ambulatory Visit: Payer: Self-pay | Admitting: Family Medicine

## 2020-03-28 ENCOUNTER — Ambulatory Visit (INDEPENDENT_AMBULATORY_CARE_PROVIDER_SITE_OTHER): Payer: Medicare Other | Admitting: Family Medicine

## 2020-03-28 ENCOUNTER — Encounter: Payer: Self-pay | Admitting: Family Medicine

## 2020-03-28 ENCOUNTER — Other Ambulatory Visit: Payer: Self-pay

## 2020-03-28 VITALS — BP 136/82 | Ht 65.0 in | Wt 160.0 lb

## 2020-03-28 DIAGNOSIS — Z0000 Encounter for general adult medical examination without abnormal findings: Secondary | ICD-10-CM

## 2020-03-28 NOTE — Progress Notes (Addendum)
Subjective:   Sara Woods is a 74 y.o. female who presents for Medicare Annual (Subsequent) preventive examination.  Participants: Nurse for intake, provider for visit, patient visit  Method of visit: Telephone  Location of Patient: Home Location of Provider: Office Consent was obtain for visit over the telephone. Services rendered by provider: Visit was performed via telephone I verified that I am speaking with the correct person using two identifiers.      Review of Systems Cardiac Risk Factors include: advanced age (>65men, >34 women)     Objective:    Today's Vitals   03/28/20 1308  BP: 136/82  Weight: 160 lb (72.6 kg)  Height: 5\' 5"  (1.651 m)  PainSc: 0-No pain   Body mass index is 26.63 kg/m.  Advanced Directives 03/28/2020 03/28/2019 11/01/2018 10/26/2018 03/22/2018 01/06/2017  Does Patient Have a Medical Advance Directive? Yes No;Yes Yes Yes Yes;No No  Type of Paramedic of [place];Living will Living will;Healthcare Power of Attorney Living will Living will - -  Does patient want to make changes to medical advance directive? - - No - Patient declined No - Patient declined No - Patient declined -  Copy of [place] in Chart? No - copy requested - No - copy requested No - copy requested - -  Would patient like information on creating a medical advance directive? - - - - Yes (ED - Information included in AVS) No - Patient declined    Current Medications (verified) Outpatient Encounter Medications as of 03/28/2020  Medication Sig  . Calcium-Vitamin D (CALTRATE 600 PLUS-VIT D PO) Take 2 tablets by mouth daily.   . diclofenac (VOLTAREN) 50 MG EC tablet TAKE 1 TABLET BY MOUTH TWICE DAILY  . doxepin (SINEQUAN) 10 MG capsule TAKE TWO CAPSULES BY MOUTH AT BEDTIME  . doxepin (SINEQUAN) 10 MG capsule Take 2 capsules (20 mg total) by mouth at bedtime.  . fluticasone (FLONASE) 50 MCG/ACT nasal spray Place 2 sprays into both  nostrils daily.  Marland Kitchen gabapentin (NEURONTIN) 300 MG capsule TAKE ONE CAPSULE BY MOUTH TWICE DAILY AS NEEDED FOR PAIN  . HYDROcodone-acetaminophen (NORCO/VICODIN) 5-325 MG tablet Take one tablet by mouth once daily as needed, for back pain  . KRILL OIL 1000 MG CAPS Take 1 capsule by mouth daily.   Marland Kitchen lisinopril (ZESTRIL) 40 MG tablet TAKE 1 TABLET BY MOUTH DAILY  . meclizine (ANTIVERT) 25 MG tablet TAKE 1 TABLET BY MOUTH THREE TIMES DAILY AS NEEDED FOR dizziness  . multivitamin (THERAGRAN) per tablet Take 1 tablet by mouth daily.   . rosuvastatin (CRESTOR) 40 MG tablet TAKE 1 TABLET BY MOUTH DAILY  . spironolactone (ALDACTONE) 25 MG tablet Take 1 tablet (25 mg total) by mouth daily.  Marland Kitchen topiramate (TOPAMAX) 50 MG tablet TAKE 1 TABLET BY MOUTH TWICE DAILY   No facility-administered encounter medications on file as of 03/28/2020.    Allergies (verified) Adhesive [tape], Penicillins, and Sulfonamide derivatives   History: Past Medical History:  Diagnosis Date  . Allergic rhinitis   . Anxiety   . Arthritis   . Depression approx 1983  . History of migraines   . Hyperlipidemia approx 2006  . Hypertension approx 2000  . MRSA infection    Recurrent abcesses to legs about 8 years ago  . Recurrent miscarriages due to luteal phase defect, not pregnant    5 miscarriages with 1 living adult son   Past Surgical History:  Procedure Laterality Date  . BREAST EXCISIONAL BIOPSY  Left   . ETHMOIDECTOMY Right 11/01/2018   Procedure: RIGHT TOTAL ETHMOIDECTOMY;  Surgeon: Leta Baptist, MD;  Location: [place];  Service: ENT;  Laterality: Right;  . FRONTAL SINUS EXPLORATION Right 11/01/2018   Procedure: FRONTAL SINUS EXPLORATION;  Surgeon: Leta Baptist, MD;  Location: [place];  Service: ENT;  Laterality: Right;  . MAXILLARY ANTROSTOMY Right 11/01/2018   Procedure: RIGHT ENDOSCOPIC MAXILLARY ANTROSTOMY WITH TISSUE REMOVAL;  Surgeon: Leta Baptist, MD;  Location: [facility];   Service: ENT;  Laterality: Right;  . NASAL SEPTOPLASTY W/ TURBINOPLASTY N/A 11/01/2018   Procedure: NASAL SEPTOPLASTY WITH TURBINATE REDUCTION;  Surgeon: Leta Baptist, MD;  Location: [place];  Service: ENT;  Laterality: N/A;  . SINUS ENDO WITH FUSION N/A 11/01/2018   Procedure: SINUS ENDO WITH FUSION;  Surgeon: Leta Baptist, MD;  Location: [place];  Service: ENT;  Laterality: N/A;  . SKIN CANCER DESTRUCTION     Back of left leg, right shoulder  . SPHENOIDECTOMY Right 11/01/2018   Procedure: RIGHT SPHENOIDECTOMY WITH TISSUE REMOVAL;  Surgeon: Leta Baptist, MD;  Location: [place];  Service: ENT;  Laterality: Right;  . TONSILLECTOMY     Childhood  . TUBAL LIGATION    . TUMOR EXCISION     Tongue   Family History  Problem Relation Age of Onset  . Prostate cancer Father        metastic, 04/2006  . Obesity Sister   . Fibromyalgia Sister   . Osteoporosis Mother   . Cerebrovascular Disease Mother   . Brain cancer Mother   . Cerebrovascular Disease Brother   . Stroke Son        x3, Vertebral artery dissection, January 2009  . Hypertension Son    Social History   Socioeconomic History  . Marital status: Married    Spouse name: Not on file  . Number of children: 1  . Years of education: 12+  . Highest education level: 12th grade  Occupational History  . Occupation: retired   Tobacco Use  . Smoking status: Never Smoker  . Smokeless tobacco: Never Used  Substance and Sexual Activity  . Alcohol use: No  . Drug use: No  . Sexual activity: Not Currently    Birth control/protection: Post-menopausal  Other Topics Concern  . Not on file  Social History Narrative   Married, lives with spouse   Spouse admitted for lumbar surgery         Social Determinants of Health   Financial Resource Strain: Low Risk   . Difficulty of Paying Living Expenses: Not hard at all  Food Insecurity: No Food Insecurity  . Worried About Charity fundraiser in the  Last Year: Never true  . Ran Out of Food in the Last Year: Never true  Transportation Needs: No Transportation Needs  . Lack of Transportation (Medical): No  . Lack of Transportation (Non-Medical): No  Physical Activity: Inactive  . Days of Exercise per Week: 0 days  . Minutes of Exercise per Session: 0 min  Stress: No Stress Concern Present  . Feeling of Stress : Only a little  Social Connections: Moderately Integrated  . Frequency of Communication with Friends and Family: More than three times a week  . Frequency of Social Gatherings with Friends and Family: More than three times a week  . Attends Religious Services: 1 to 4 times per year  . Active Member of Clubs or Organizations: No  .  Attends Archivist Meetings: Never  . Marital Status: Married    Tobacco Counseling Counseling given: Yes   Clinical Intake:  Pre-visit preparation completed: Yes  Pain : No/denies pain Pain Score: 0-No pain     BMI - recorded: 26.63 Nutritional Status: BMI 25 -29 Overweight Nutritional Risks: None Diabetes: No  How often do you need to have someone help you when you read instructions, pamphlets, or other written materials from your doctor or pharmacy?: 1 - Never What is the last grade level you completed in school?: 2 years of college  Diabetic? no  Interpreter Needed?: No  Information entered by :: Laretta Bolster, LPN   Activities of Daily Living In your present state of health, do you have any difficulty performing the following activities: 03/28/2020  Hearing? N  Vision? N  Difficulty concentrating or making decisions? N  Walking or climbing stairs? N  Dressing or bathing? N  Doing errands, shopping? N  Preparing Food and eating ? N  Using the Toilet? N  In the past six months, have you accidently leaked urine? N  Do you have problems with loss of bowel control? N  Managing your Medications? N  Managing your Finances? N  Housekeeping or managing your  Housekeeping? N  Some recent data might be hidden    Patient Care Team: Fayrene Helper, MD as PCP - Danella Sensing, MD as Referring Physician (Unknown Physician Specialty)  Indicate any recent Medical Services you may have received from other than [facility] providers in the past year (date may be approximate).     Assessment:   This is a routine wellness examination for [place].  Hearing/Vision screen No exam data present  Dietary issues and exercise activities discussed: Current Exercise Habits: The patient does not participate in regular exercise at present, Exercise limited by: None identified  Goals    . DIET - EAT MORE FRUITS AND VEGETABLES    . Exercise 3x per week (30 min per time)     Recommend starting a routine exercise program at least 3 days a week for 30-45 minutes at a time as tolerated.        Depression Screen PHQ 2/9 Scores 03/28/2020 03/28/2020 05/31/2019 03/28/2019 05/17/2018 04/11/2018 03/22/2018  PHQ - 2 Score 0 0 0 0 0 0 0  PHQ- 9 Score - - 0 - 0 2 -    Fall Risk Fall Risk  03/28/2020 12/28/2019 05/31/2019 03/28/2019 01/24/2019  Falls in the past year? 0 0 0 0 0  Number falls in past yr: 0 0 0 0 0  Injury with Fall? 0 0 0 0 0  Risk Factor Category  - - - - -  Risk for fall due to : No Fall Risks - - - -  Follow up Falls evaluation completed - - Falls evaluation completed;Education provided;Falls prevention discussed -    Any stairs in or around the home? No  If so, are there any without handrails? No  Home free of loose throw rugs in walkways, pet beds, electrical cords, etc? Yes  Adequate lighting in your home to reduce risk of falls? Yes   ASSISTIVE DEVICES UTILIZED TO PREVENT FALLS:  Life alert? No  Use of a cane, walker or w/c? No  Grab bars in the bathroom? No  Shower chair or bench in shower? No  Elevated toilet seat or a handicapped toilet? No   TIMED UP AND GO:  Was the test performed? No .  Length of time  to ambulate  n/a  Cognitive Function:     6CIT Screen 03/28/2020 03/28/2019 03/22/2018 01/06/2017  What Year? 0 points 0 points 0 points 0 points  What month? 0 points 0 points 0 points 0 points  What time? 0 points 0 points 0 points 0 points  Count back from 20 0 points 0 points 0 points 0 points  Months in reverse 0 points 0 points 0 points 0 points  Repeat phrase 0 points 0 points 0 points 0 points  Total Score 0 0 0 0    Immunizations Immunization History  Administered Date(s) Administered  . H1N1 04/19/2008  . Influenza Split 01/30/2011, 02/24/2012  . Influenza Whole 02/23/2007, 02/23/2008  . Influenza,inj,Quad PF,6+ Mos 02/27/2013, 04/19/2014, 02/26/2015, 03/23/2016, 01/06/2017, 12/23/2017  . Moderna SARS-COVID-2 Vaccination 07/06/2019, 07/30/2019  . Pneumococcal Conjugate-13 12/06/2013  . Pneumococcal Polysaccharide-23 12/19/2010  . Td 02/24/2010  . Zoster 12/19/2010    TDAP status: Due, Education has been provided regarding the importance of this vaccine. Advised may receive this vaccine at local pharmacy or Health Dept. Aware to provide a copy of the vaccination record if obtained from local pharmacy or Health Dept. Verbalized acceptance and understanding. Flu Vaccine status: Up to date Pneumococcal vaccine status: Completed during today's visit. Covid-19 vaccine status: Completed vaccines  Qualifies for Shingles Vaccine? Yes   Zostavax completed No   Shingrix Completed?: No.    Education has been provided regarding the importance of this vaccine. Patient has been advised to call insurance company to determine out of pocket expense if they have not yet received this vaccine. Advised may also receive vaccine at local pharmacy or Health Dept. Verbalized acceptance and understanding.  Screening Tests Health Maintenance  Topic Date Due  . INFLUENZA VACCINE  12/10/2019  . TETANUS/TDAP  02/25/2020  . COLONOSCOPY  08/04/2020  . MAMMOGRAM  11/28/2021  . DEXA SCAN  Completed  .  COVID-19 Vaccine  Completed  . Hepatitis C Screening  Completed  . PNA vac Low Risk Adult  Completed    Health Maintenance  Health Maintenance Due  Topic Date Due  . INFLUENZA VACCINE  12/10/2019  . TETANUS/TDAP  02/25/2020    Colorectal cancer screening: Completed 08/05/2010. Repeat every 10 years Mammogram status: Completed 11/29/19. Repeat every year Bone Density status: Completed 07/19/13. Results reflect: Bone density results: NORMAL. Repeat every 5 years.  Lung Cancer Screening: (Low Dose CT Chest recommended if Age 41-80 years, 30 pack-year currently smoking OR have quit w/in 15years.) does not qualify.   Lung Cancer Screening Referral: n/a  Additional Screening:  Hepatitis C Screening: does not qualify  Vision Screening: Recommended annual ophthalmology exams for early detection of glaucoma and other disorders of the eye. Is the patient up to date with their annual eye exam?  No  Who is the provider or what is the name of the office in which the patient attends annual eye exams? Dr. Jeris Penta in [place] If pt is not established with a provider, would they like to be referred to a provider to establish care? No .   Dental Screening: Recommended annual dental exams for proper oral hygiene  Community Resource Referral / Chronic Care Management: CRR required this visit?  No   CCM required this visit?  No      Plan:     1. Encounter for Medicare annual wellness exam   I have personally reviewed and noted the following in the patient's chart:   . Medical and social history . Use of alcohol,  tobacco or illicit drugs  . Current medications and supplements . Functional ability and status . Nutritional status . Physical activity . Advanced directives . List of other physicians . Hospitalizations, surgeries, and ER visits in previous 12 months . Vitals . Screenings to include cognitive, depression, and falls . Referrals and appointments  In addition, I have reviewed  and discussed with patient certain preventive protocols, quality metrics, and best practice recommendations. A written personalized care plan for preventive services as well as general preventive health recommendations were provided to patient.     Perlie Mayo, NP   03/28/2020   Nurse Notes: AWV was conducted over the phone with patients consent to do telehealth visit via audio. Provider was here in the office. Visit took 30 minutes to complete.

## 2020-03-28 NOTE — Patient Instructions (Addendum)
[name] , Thank you for taking time to come for your Medicare Wellness Visit. I appreciate your ongoing commitment to your health goals. Please review the following plan we discussed and let me know if I can assist you in the future.   Screening recommendations/referrals: Colonoscopy: 08/04/20 Mammogram: 11/28/21 Bone Density: Complete Recommended yearly ophthalmology/optometry visit for glaucoma screening and checkup Recommended yearly dental visit for hygiene and checkup  Vaccinations: Influenza vaccine: Fall 2022  Pneumococcal vaccine:  Tdap vaccine: Complete Shingles vaccine: Education provided  Advanced directives: POA and Living Will  Conditions/risks identified: none  Next appointment: 05/22/20 @ 11 am   Preventive Care 65 Years and Older, Female Preventive care refers to lifestyle choices and visits with your health care provider that can promote health and wellness. What does preventive care include?  A yearly physical exam. This is also called an annual well check.  Dental exams once or twice a year.  Routine eye exams. Ask your health care provider how often you should have your eyes checked.  Personal lifestyle choices, including:  Daily care of your teeth and gums.  Regular physical activity.  Eating a healthy diet.  Avoiding tobacco and drug use.  Limiting alcohol use.  Practicing safe sex.  Taking low-dose aspirin every day.  Taking vitamin and mineral supplements as recommended by your health care provider. What happens during an annual well check? The services and screenings done by your health care provider during your annual well check will depend on your age, overall health, lifestyle risk factors, and family history of disease. Counseling  Your health care provider may ask you questions about your:  Alcohol use.  Tobacco use.  Drug use.  Emotional well-being.  Home and relationship well-being.  Sexual activity.  Eating  habits.  History of falls.  Memory and ability to understand (cognition).  Work and work Statistician.  Reproductive health. Screening  You may have the following tests or measurements:  Height, weight, and BMI.  Blood pressure.  Lipid and cholesterol levels. These may be checked every 5 years, or more frequently if you are over 71 years old.  Skin check.  Lung cancer screening. You may have this screening every year starting at age 51 if you have a 30-pack-year history of smoking and currently smoke or have quit within the past 15 years.  Fecal occult blood test (FOBT) of the stool. You may have this test every year starting at age 76.  Flexible sigmoidoscopy or colonoscopy. You may have a sigmoidoscopy every 5 years or a colonoscopy every 10 years starting at age 86.  Hepatitis C blood test.  Hepatitis B blood test.  Sexually transmitted disease (STD) testing.  Diabetes screening. This is done by checking your blood sugar (glucose) after you have not eaten for a while (fasting). You may have this done every 1-3 years.  Bone density scan. This is done to screen for osteoporosis. You may have this done starting at age 80.  Mammogram. This may be done every 1-2 years. Talk to your health care provider about how often you should have regular mammograms. Talk with your health care provider about your test results, treatment options, and if necessary, the need for more tests. Vaccines  Your health care provider may recommend certain vaccines, such as:  Influenza vaccine. This is recommended every year.  Tetanus, diphtheria, and acellular pertussis (Tdap, Td) vaccine. You may need a Td booster every 10 years.  Zoster vaccine. You may need this after age 104.  Pneumococcal 13-valent conjugate (PCV13) vaccine. One dose is recommended after age 75.  Pneumococcal polysaccharide (PPSV23) vaccine. One dose is recommended after age 39. Talk to your health care provider about which  screenings and vaccines you need and how often you need them. This information is not intended to replace advice given to you by your health care provider. Make sure you discuss any questions you have with your health care provider. Document Released: 05/24/2015 Document Revised: 01/15/2016 Document Reviewed: 02/26/2015 Elsevier Interactive Patient Education  2017 [employer] Prevention in the Home Falls can cause injuries. They can happen to people of all ages. There are many things you can do to make your home safe and to help prevent falls. What can I do on the outside of my home?  Regularly fix the edges of walkways and driveways and fix any cracks.  Remove anything that might make you trip as you walk through a door, such as a raised step or threshold.  Trim any bushes or trees on the path to your home.  Use bright outdoor lighting.  Clear any walking paths of anything that might make someone trip, such as rocks or tools.  Regularly check to see if handrails are loose or broken. Make sure that both sides of any steps have handrails.  Any raised decks and porches should have guardrails on the edges.  Have any leaves, snow, or ice cleared regularly.  Use sand or salt on walking paths during winter.  Clean up any spills in your garage right away. This includes oil or grease spills. What can I do in the bathroom?  Use night lights.  Install grab bars by the toilet and in the tub and shower. Do not use towel bars as grab bars.  Use non-skid mats or decals in the tub or shower.  If you need to sit down in the shower, use a plastic, non-slip stool.  Keep the floor dry. Clean up any water that spills on the floor as soon as it happens.  Remove soap buildup in the tub or shower regularly.  Attach bath mats securely with double-sided non-slip rug tape.  Do not have throw rugs and other things on the floor that can make you trip. What can I do in the bedroom?  Use  night lights.  Make sure that you have a light by your bed that is easy to reach.  Do not use any sheets or blankets that are too big for your bed. They should not hang down onto the floor.  Have a firm chair that has side arms. You can use this for support while you get dressed.  Do not have throw rugs and other things on the floor that can make you trip. What can I do in the kitchen?  Clean up any spills right away.  Avoid walking on wet floors.  Keep items that you use a lot in easy-to-reach places.  If you need to reach something above you, use a strong step stool that has a grab bar.  Keep electrical cords out of the way.  Do not use floor polish or wax that makes floors slippery. If you must use wax, use non-skid floor wax.  Do not have throw rugs and other things on the floor that can make you trip. What can I do with my stairs?  Do not leave any items on the stairs.  Make sure that there are handrails on both sides of the stairs and use them.  Fix handrails that are broken or loose. Make sure that handrails are as long as the stairways.  Check any carpeting to make sure that it is firmly attached to the stairs. Fix any carpet that is loose or worn.  Avoid having throw rugs at the top or bottom of the stairs. If you do have throw rugs, attach them to the floor with carpet tape.  Make sure that you have a light switch at the top of the stairs and the bottom of the stairs. If you do not have them, ask someone to add them for you. What else can I do to help prevent falls?  Wear shoes that:  Do not have high heels.  Have rubber bottoms.  Are comfortable and fit you well.  Are closed at the toe. Do not wear sandals.  If you use a stepladder:  Make sure that it is fully opened. Do not climb a closed stepladder.  Make sure that both sides of the stepladder are locked into place.  Ask someone to hold it for you, if possible.  Clearly mark and make sure that you  can see:  Any grab bars or handrails.  First and last steps.  Where the edge of each step is.  Use tools that help you move around (mobility aids) if they are needed. These include:  Canes.  Walkers.  Scooters.  Crutches.  Turn on the lights when you go into a dark area. Replace any light bulbs as soon as they burn out.  Set up your furniture so you have a clear path. Avoid moving your furniture around.  If any of your floors are uneven, fix them.  If there are any pets around you, be aware of where they are.  Review your medicines with your doctor. Some medicines can make you feel dizzy. This can increase your chance of falling. Ask your doctor what other things that you can do to help prevent falls. This information is not intended to replace advice given to you by your health care provider. Make sure you discuss any questions you have with your health care provider. Document Released: 02/21/2009 Document Revised: 10/03/2015 Document Reviewed: 06/01/2014 Elsevier Interactive Patient Education  2017 [employer].

## 2020-04-08 ENCOUNTER — Other Ambulatory Visit: Payer: Self-pay | Admitting: Family Medicine

## 2020-04-10 ENCOUNTER — Other Ambulatory Visit: Payer: Self-pay | Admitting: Family Medicine

## 2020-04-17 ENCOUNTER — Other Ambulatory Visit: Payer: Self-pay | Admitting: Family Medicine

## 2020-04-29 DIAGNOSIS — Z23 Encounter for immunization: Secondary | ICD-10-CM | POA: Diagnosis not present

## 2020-05-07 ENCOUNTER — Other Ambulatory Visit: Payer: Self-pay | Admitting: Family Medicine

## 2020-05-14 ENCOUNTER — Telehealth: Payer: Self-pay

## 2020-05-14 NOTE — Telephone Encounter (Signed)
She was requesting a refill on the hydrocodone and she will need an appointment for this. Husband said she is helping take care of his mother and if you cannot get her on her cell to call and leave a message on the home phone because she doesn't know how to check the cell voicemail.

## 2020-05-14 NOTE — Telephone Encounter (Signed)
Pt has an appt on 1-12, and can wait to see Dr Lodema Hong then for pain medication

## 2020-05-22 ENCOUNTER — Telehealth (INDEPENDENT_AMBULATORY_CARE_PROVIDER_SITE_OTHER): Payer: Medicare Other | Admitting: Family Medicine

## 2020-05-22 ENCOUNTER — Other Ambulatory Visit: Payer: Self-pay

## 2020-05-22 ENCOUNTER — Encounter: Payer: Self-pay | Admitting: Family Medicine

## 2020-05-22 DIAGNOSIS — E559 Vitamin D deficiency, unspecified: Secondary | ICD-10-CM

## 2020-05-22 DIAGNOSIS — M129 Arthropathy, unspecified: Secondary | ICD-10-CM

## 2020-05-22 DIAGNOSIS — G8929 Other chronic pain: Secondary | ICD-10-CM | POA: Diagnosis not present

## 2020-05-22 DIAGNOSIS — E785 Hyperlipidemia, unspecified: Secondary | ICD-10-CM

## 2020-05-22 DIAGNOSIS — I10 Essential (primary) hypertension: Secondary | ICD-10-CM | POA: Diagnosis not present

## 2020-05-22 MED ORDER — HYDROCODONE-ACETAMINOPHEN 5-300 MG PO TABS: ORAL_TABLET | ORAL | 0 refills | Status: DC

## 2020-05-22 MED ORDER — AZITHROMYCIN 250 MG PO TABS: ORAL_TABLET | ORAL | 0 refills | Status: DC

## 2020-05-22 NOTE — Addendum Note (Signed)
Addended by: Eual Fines on: 05/22/2020 01:54 PM   Modules accepted: Orders

## 2020-05-22 NOTE — Assessment & Plan Note (Signed)
DASH diet and commitment to daily physical activity for a minimum of 30 minutes discussed and encouraged, as a part of hypertension management. The importance of attaining a healthy weight is also discussed.  BP/Weight 03/28/2020 12/28/2019 05/31/2019 03/28/2019 01/24/2019 12/26/2018 10/15/43  Systolic BP 997 741 423 953 202 334 356  Diastolic BP 82 82 82 82 82 86 86  Wt. (Lbs) 160 160 160 160 160 167 159.39  BMI 26.63 26.63 26.63 26.63 26.63 27.79 26.52

## 2020-05-22 NOTE — Progress Notes (Signed)
Virtual Visit via Telephone Note  I connected with Sara Woods on 05/22/20 at 11:00 AM EST by telephone and verified that I am speaking with the correct person using two identifiers.  Location: Patient: home Provider: work   I discussed the limitations, risks, security and privacy concerns of performing an evaluation and management service by telephone and the availability of in person appointments. I also discussed with the patient that there may be a patient responsible charge related to this service. The patient expressed understanding and agreed to proceed.   History of Present Illness: Denies recent fever or chills. C/o frontal sinus pressure, post nasal drainage with cough, drainage is thick and yellow, and green at times, over the past 2 weeks worse symptoms have been going on for  Past several months, while she has been caring for hr Mom in law who keeps her house warm  Denies chest congestion, productive cough or wheezing. Denies chest pains, palpitations and leg swelling Denies abdominal pain, nausea, vomiting,diarrhea or constipation.   Denies dysuria, frequency, hesitancy or incontinence. Denies uncontrolled  joint pain, swelling and limitation in mobility. Denies headaches, seizures, numbness, or tingling. Denies depression, anxiety or insomnia. Denies skin break down or rash.       Observations/Objective: There were no vitals taken for this visit. Good communication with no confusion and intact memory. Alert and oriented x 3 No signs of respiratory distress during speech    Assessment and Plan: Essential hypertension DASH diet and commitment to daily physical activity for a minimum of 30 minutes discussed and encouraged, as a part of hypertension management. The importance of attaining a healthy weight is also discussed.  BP/Weight 03/28/2020 12/28/2019 05/31/2019 03/28/2019 01/24/2019 12/26/2018 4/74/2595  Systolic BP 638 756 433 295 188 416 606  Diastolic BP  82 82 82 82 82 86 86  Wt. (Lbs) 160 160 160 160 160 167 159.39  BMI 26.63 26.63 26.63 26.63 26.63 27.79 26.52       Hyperlipidemia LDL goal <100 Hyperlipidemia:Low fat diet discussed and encouraged.   Lipid Panel  Lab Results  Component Value Date   CHOL 146 05/23/2019   HDL 57 05/23/2019   LDLCALC 70 05/23/2019   TRIG 105 05/23/2019   CHOLHDL 2.6 05/23/2019     Updated lab needed at/ before next visit.   Arthropathy, multiple sites Controlled, no change in medication   Encounter for chronic pain management The patient's Controlled Substance registry is reviewed and compliance confirmed. Adequacy of  Pain control and level of function is assessed. Medication dosing is adjusted as deemed appropriate. Twelve weeks of medication is prescribed , with a follow up appointment between 11 to 12 weeks .   Migraine with aura Controlled, no change in medication    Follow Up Instructions:    I discussed the assessment and treatment plan with the patient. The patient was provided an opportunity to ask questions and all were answered. The patient agreed with the plan and demonstrated an understanding of the instructions.   The patient was advised to call back or seek an in-person evaluation if the symptoms worsen or if the condition fails to improve as anticipated.  I provided 22 minutes of non-face-to-face time during this encounter.   Tula Nakayama, MD

## 2020-05-22 NOTE — Assessment & Plan Note (Signed)
The patient's Controlled Substance registry is reviewed and compliance confirmed. Adequacy of  Pain control and level of function is assessed. Medication dosing is adjusted as deemed appropriate. Twelve weeks of medication is prescribed , with a follow up appointment between 11 to 12 weeks .  

## 2020-05-22 NOTE — Assessment & Plan Note (Signed)
Controlled, no change in medication  

## 2020-05-22 NOTE — Assessment & Plan Note (Signed)
Hyperlipidemia:Low fat diet discussed and encouraged.   Lipid Panel  Lab Results  Component Value Date   CHOL 146 05/23/2019   HDL 57 05/23/2019   LDLCALC 70 05/23/2019   TRIG 105 05/23/2019   CHOLHDL 2.6 05/23/2019   Updated lab needed at/ before next visit.    

## 2020-05-22 NOTE — Patient Instructions (Addendum)
F/U in office with MD to re evaluate blood pressure , weight and chronic conditions in 4.5 months, call if you need me sooner  Azithromycin is prescribed for sinus infection  Please check your blood pressure and if 140/90 or more, call for an in office appointment  Please get fasting CBC, lipid, cmp and EGFr, TSH and vit D in the next 1 to 2 weeks  Please be careful not to fall  HAPPY BIRTHDAY, and MANY, MANY MORE!!!  Thanks for choosing [facility], we consider it a privelige to serve you.

## 2020-06-06 ENCOUNTER — Other Ambulatory Visit: Payer: Self-pay | Admitting: Family Medicine

## 2020-06-26 ENCOUNTER — Encounter: Payer: Self-pay | Admitting: Family Medicine

## 2020-06-26 DIAGNOSIS — E559 Vitamin D deficiency, unspecified: Secondary | ICD-10-CM | POA: Diagnosis not present

## 2020-06-26 DIAGNOSIS — I10 Essential (primary) hypertension: Secondary | ICD-10-CM | POA: Diagnosis not present

## 2020-06-26 DIAGNOSIS — E785 Hyperlipidemia, unspecified: Secondary | ICD-10-CM | POA: Diagnosis not present

## 2020-06-27 LAB — CBC
Hematocrit: 42.4 % (ref 34.0–46.6)
Hemoglobin: 13.9 g/dL (ref 11.1–15.9)
MCH: 30.5 pg (ref 26.6–33.0)
MCHC: 32.8 g/dL (ref 31.5–35.7)
MCV: 93 fL (ref 79–97)
Platelets: 269 10*3/uL (ref 150–450)
RBC: 4.55 x10E6/uL (ref 3.77–5.28)
RDW: 11.9 % (ref 11.7–15.4)
WBC: 5.7 10*3/uL (ref 3.4–10.8)

## 2020-06-27 LAB — VITAMIN D 25 HYDROXY (VIT D DEFICIENCY, FRACTURES): Vit D, 25-Hydroxy: 47.1 ng/mL (ref 30.0–100.0)

## 2020-06-27 LAB — LIPID PANEL
Chol/HDL Ratio: 2.7 ratio (ref 0.0–4.4)
Cholesterol, Total: 148 mg/dL (ref 100–199)
HDL: 54 mg/dL (ref >39)
LDL Chol Calc (NIH): 77 mg/dL (ref 0–99)
Triglycerides: 90 mg/dL (ref 0–149)
VLDL Cholesterol Cal: 17 mg/dL (ref 5–40)

## 2020-06-27 LAB — TSH: TSH: 2.27 u[IU]/mL (ref 0.450–4.500)

## 2020-06-28 ENCOUNTER — Other Ambulatory Visit: Payer: Self-pay

## 2020-07-01 ENCOUNTER — Other Ambulatory Visit: Payer: Self-pay

## 2020-07-04 ENCOUNTER — Encounter: Payer: Self-pay | Admitting: Internal Medicine

## 2020-08-27 ENCOUNTER — Other Ambulatory Visit: Payer: Self-pay | Admitting: Family Medicine

## 2020-09-23 ENCOUNTER — Other Ambulatory Visit: Payer: Self-pay | Admitting: Family Medicine

## 2020-10-21 ENCOUNTER — Other Ambulatory Visit: Payer: Self-pay | Admitting: Family Medicine

## 2020-11-18 ENCOUNTER — Other Ambulatory Visit: Payer: Self-pay | Admitting: Family Medicine

## 2020-11-21 ENCOUNTER — Ambulatory Visit: Payer: Medicare Other | Admitting: Family Medicine

## 2020-11-28 DIAGNOSIS — H40033 Anatomical narrow angle, bilateral: Secondary | ICD-10-CM | POA: Diagnosis not present

## 2020-11-28 DIAGNOSIS — H2513 Age-related nuclear cataract, bilateral: Secondary | ICD-10-CM | POA: Diagnosis not present

## 2020-12-05 DIAGNOSIS — H25811 Combined forms of age-related cataract, right eye: Secondary | ICD-10-CM | POA: Diagnosis not present

## 2020-12-05 DIAGNOSIS — H35371 Puckering of macula, right eye: Secondary | ICD-10-CM | POA: Diagnosis not present

## 2020-12-05 DIAGNOSIS — H25812 Combined forms of age-related cataract, left eye: Secondary | ICD-10-CM | POA: Diagnosis not present

## 2020-12-05 DIAGNOSIS — Z01818 Encounter for other preprocedural examination: Secondary | ICD-10-CM | POA: Diagnosis not present

## 2020-12-10 ENCOUNTER — Other Ambulatory Visit: Payer: Self-pay

## 2020-12-10 ENCOUNTER — Ambulatory Visit (INDEPENDENT_AMBULATORY_CARE_PROVIDER_SITE_OTHER): Payer: Medicare Other | Admitting: Family Medicine

## 2020-12-10 ENCOUNTER — Encounter: Payer: Self-pay | Admitting: Family Medicine

## 2020-12-10 VITALS — BP 158/90 | HR 68 | Resp 16 | Ht 65.0 in | Wt 154.1 lb

## 2020-12-10 DIAGNOSIS — H269 Unspecified cataract: Secondary | ICD-10-CM | POA: Diagnosis not present

## 2020-12-10 DIAGNOSIS — G43109 Migraine with aura, not intractable, without status migrainosus: Secondary | ICD-10-CM

## 2020-12-10 DIAGNOSIS — E785 Hyperlipidemia, unspecified: Secondary | ICD-10-CM | POA: Diagnosis not present

## 2020-12-10 DIAGNOSIS — I10 Essential (primary) hypertension: Secondary | ICD-10-CM

## 2020-12-10 DIAGNOSIS — G8929 Other chronic pain: Secondary | ICD-10-CM | POA: Diagnosis not present

## 2020-12-10 MED ORDER — AMLODIPINE BESYLATE 2.5 MG PO TABS: ORAL_TABLET | ORAL | 4 refills | Status: DC

## 2020-12-10 NOTE — Progress Notes (Signed)
Dine 2.5   Sara Woods     MRN: [id number]      DOB: 1946/03/22   HPI Sara Woods is here for follow up and re-evaluation of chronic medical conditions, medication management and review of any available recent lab and radiology data.  Preventive health is updated, specifically  Cancer screening and Immunization.   Has bilateral cataract surgery 8/22 and after, starting with the  left, no longer driving, vision significantly impaired  . ROS Denies recent fever or chills. Denies sinus pressure, nasal congestion, ear pain or sore throat. Denies chest congestion, productive cough or wheezing. Denies chest pains, palpitations and leg swelling Denies abdominal pain, nausea, vomiting,diarrhea or constipation.   Denies dysuria, frequency, hesitancy or incontinence. Denies  uncontrolled joint pain, swelling and limitation in mobility. Denies headaches, seizures, numbness, or tingling. Denies depression, anxiety or insomnia. Denies skin break down or rash.   PE  BP (!) 158/90   Pulse 68   Resp 16   Ht '5\' 5"'$  (1.651 m)   Wt 154 lb 1.9 oz (69.9 kg)   SpO2 96%   BMI 25.65 kg/m   Patient alert and oriented and in no cardiopulmonary distress.  HEENT: No facial asymmetry, EOMI,     Neck supple .  Chest: Clear to auscultation bilaterally.  CVS: S1, S2 no murmurs, no S3.Regular rate.  ABD: Soft non tender.   Ext: No edema  MS: Adequate ROM spine, shoulders, hips and knees.  Skin: Intact, no ulcerations or rash noted.  Psych: Good eye contact, normal affect. Memory intact not anxious or depressed appearing.  CNS: CN 2-12 intact, power,  normal throughout.no focal deficits noted.   Assessment & Plan  Essential hypertension Uncontrolled add amlodipine 2.5 mg with close follow up DASH diet and commitment to daily physical activity for a minimum of 30 minutes discussed and encouraged, as a part of hypertension management. The importance of attaining a healthy weight is also  discussed.  BP/Weight 12/10/2020 03/28/2020 12/28/2019 05/31/2019 03/28/2019 01/24/2019 [id number]  Systolic BP 0000000 [id number] XX123456 XX123456 XX123456 [id number]  Diastolic BP 90 82 82 82 82 82 86  Wt. (Lbs) 154.12 160 160 160 160 160 167  BMI 25.65 26.63 26.63 26.63 26.63 26.63 27.79       Hyperlipidemia LDL goal <100 Hyperlipidemia:Low fat diet discussed and encouraged.   Lipid Panel  Lab Results  Component Value Date   CHOL 148 06/26/2020   HDL 54 06/26/2020   LDLCALC 77 06/26/2020   TRIG 90 06/26/2020   CHOLHDL 2.7 06/26/2020   Controlled, no change in medication     Encounter for chronic pain management Gabapentin 300 mg twice daily as before The patient's Controlled Substance registry is reviewed and compliance confirmed. Adequacy of  Pain control and level of function is assessed. Medication dosing is adjusted as deemed appropriate. Twelve weeks of medication is prescribed , with a follow up appointment between 11 to 12 weeks .   Migraine with aura Controlled, no change in medication   Cataracts, bilateral suregry planned for next week and then following that a few  Weeks later

## 2020-12-10 NOTE — Patient Instructions (Signed)
Nurse visit in 1 week , re evaluate blood pressure   MD visit in 2 months, re evaluate chronic condiotions and flu vaccine  New additional medication for blood pressure , starting this evening at 8 pm is amlodipine 2.5 mg, take this at the same time every evening please  All the best with caaract surgery  Thanks for choosing [facility], we consider it a privelige to serve you.

## 2020-12-13 ENCOUNTER — Ambulatory Visit: Payer: Medicare Other

## 2020-12-16 ENCOUNTER — Encounter: Payer: Self-pay | Admitting: Family Medicine

## 2020-12-16 ENCOUNTER — Other Ambulatory Visit: Payer: Self-pay | Admitting: Family Medicine

## 2020-12-16 ENCOUNTER — Other Ambulatory Visit: Payer: Self-pay

## 2020-12-16 ENCOUNTER — Telehealth: Payer: Self-pay

## 2020-12-16 ENCOUNTER — Ambulatory Visit: Payer: Medicare Other

## 2020-12-16 VITALS — BP 152/97 | HR 85

## 2020-12-16 DIAGNOSIS — I10 Essential (primary) hypertension: Secondary | ICD-10-CM

## 2020-12-16 DIAGNOSIS — H269 Unspecified cataract: Secondary | ICD-10-CM | POA: Insufficient documentation

## 2020-12-16 MED ORDER — TOPIRAMATE 50 MG PO TABS: 50.0000 mg | ORAL_TABLET | Freq: Two times a day (BID) | ORAL | 5 refills | Status: DC

## 2020-12-16 NOTE — Assessment & Plan Note (Signed)
Gabapentin 300 mg twice daily as before The patient's Controlled Substance registry is reviewed and compliance confirmed. Adequacy of  Pain control and level of function is assessed. Medication dosing is adjusted as deemed appropriate. Twelve weeks of medication is prescribed , with a follow up appointment between 11 to 12 weeks .

## 2020-12-16 NOTE — Assessment & Plan Note (Signed)
Hyperlipidemia:Low fat diet discussed and encouraged.   Lipid Panel  Lab Results  Component Value Date   CHOL 148 06/26/2020   HDL 54 06/26/2020   LDLCALC 77 06/26/2020   TRIG 90 06/26/2020   CHOLHDL 2.7 06/26/2020   Controlled, no change in medication

## 2020-12-16 NOTE — Telephone Encounter (Signed)
Pt came in today for her b/p check. Her b/p was 152/97. She said you added her another b/p medication to her med list last week and her b/p is still running about the same. Please advise.

## 2020-12-16 NOTE — Assessment & Plan Note (Signed)
suregry planned for next week and then following that a few  Weeks later

## 2020-12-16 NOTE — Assessment & Plan Note (Signed)
Uncontrolled add amlodipine 2.5 mg with close follow up DASH diet and commitment to daily physical activity for a minimum of 30 minutes discussed and encouraged, as a part of hypertension management. The importance of attaining a healthy weight is also discussed.  BP/Weight 12/10/2020 03/28/2020 12/28/2019 05/31/2019 03/28/2019 01/24/2019 [id number]  Systolic BP 0000000 [id number] [id number] [id number] [id number] [id number] [id number]  Diastolic BP 90 82 82 82 82 82 86  Wt. (Lbs) 154.12 160 160 160 160 160 167  BMI 25.65 26.63 26.63 26.63 26.63 26.63 27.79

## 2020-12-16 NOTE — Assessment & Plan Note (Signed)
Controlled, no change in medication  

## 2020-12-17 ENCOUNTER — Ambulatory Visit: Payer: Medicare Other

## 2020-12-19 DIAGNOSIS — H25811 Combined forms of age-related cataract, right eye: Secondary | ICD-10-CM | POA: Diagnosis not present

## 2020-12-19 DIAGNOSIS — H2512 Age-related nuclear cataract, left eye: Secondary | ICD-10-CM | POA: Diagnosis not present

## 2020-12-19 DIAGNOSIS — H25812 Combined forms of age-related cataract, left eye: Secondary | ICD-10-CM | POA: Diagnosis not present

## 2021-01-08 ENCOUNTER — Other Ambulatory Visit: Payer: Self-pay | Admitting: Family Medicine

## 2021-01-09 DIAGNOSIS — H2511 Age-related nuclear cataract, right eye: Secondary | ICD-10-CM | POA: Diagnosis not present

## 2021-01-09 DIAGNOSIS — H25811 Combined forms of age-related cataract, right eye: Secondary | ICD-10-CM | POA: Diagnosis not present

## 2021-01-13 HISTORY — PX: EYE SURGERY: SHX253

## 2021-02-11 ENCOUNTER — Ambulatory Visit (INDEPENDENT_AMBULATORY_CARE_PROVIDER_SITE_OTHER): Payer: Medicare Other | Admitting: Family Medicine

## 2021-02-11 ENCOUNTER — Other Ambulatory Visit: Payer: Self-pay

## 2021-02-11 ENCOUNTER — Encounter: Payer: Self-pay | Admitting: Family Medicine

## 2021-02-11 VITALS — BP 134/82 | HR 82 | Resp 16 | Ht 63.0 in | Wt 157.0 lb

## 2021-02-11 DIAGNOSIS — E785 Hyperlipidemia, unspecified: Secondary | ICD-10-CM | POA: Diagnosis not present

## 2021-02-11 DIAGNOSIS — F418 Other specified anxiety disorders: Secondary | ICD-10-CM | POA: Diagnosis not present

## 2021-02-11 DIAGNOSIS — I10 Essential (primary) hypertension: Secondary | ICD-10-CM | POA: Diagnosis not present

## 2021-02-11 DIAGNOSIS — Z23 Encounter for immunization: Secondary | ICD-10-CM

## 2021-02-11 NOTE — Assessment & Plan Note (Signed)
Controlled, no change in medication  

## 2021-02-11 NOTE — Patient Instructions (Addendum)
F/U in February, call if you need me before  Blood pressure is excellent, continue current meds  Please let me know as soon as I can refer you for the colonoscopy which you need  Flu vaccine today  Back and hip pain aggravated by climbing stairs , call if gets more intense for visit for injections which have helped in the past  Thankful vision is good  Fasting  lipid, cmp and EGFr in nextr 1 to 2 weeks please  Thanks for choosing [facility], we consider it a privelige to serve you.

## 2021-02-11 NOTE — Assessment & Plan Note (Signed)
Controlled, no change in medication DASH diet and commitment to daily physical activity for a minimum of 30 minutes discussed and encouraged, as a part of hypertension management. The importance of attaining a healthy weight is also discussed.  BP/Weight 02/11/2021 12/16/2020 12/10/2020 03/28/2020 12/28/2019 05/31/2019 44/58/4835  Systolic BP 075 732 256 720 919 802 217  Diastolic BP 82 97 90 82 82 82 82  Wt. (Lbs) 157 - 154.12 160 160 160 160  BMI 27.81 - 25.65 26.63 26.63 26.63 26.63

## 2021-02-11 NOTE — Progress Notes (Signed)
   [name]     MRN: [id number]      DOB: 1945/08/24   HPI Sara Woods is here for follow up and re-evaluation of chronic medical conditions, I particular hypertension,medication management and review of any available recent lab and radiology data.  She has had successful bilateral cataract extraction since her last vist The PT denies any adverse reactions to current medications since the last visit.  Choses to delay referral for colonoscopy at this time, focused on husband's health   ROS Denies recent fever or chills. Denies sinus pressure, nasal congestion, ear pain or sore throat. Denies chest congestion, productive cough or wheezing. Denies chest pains, palpitations and leg swelling Denies abdominal pain, nausea, vomiting,diarrhea or constipation.   Denies dysuria, frequency, hesitancy or incontinence. Co recurrent , intermittent low back and right hip pain, no need for injections today. Denies headaches, seizures, numbness, or tingling. Denies depression, uncontrolled anxiety or insomnia. Denies skin break down or rash.   PE  BP 134/82   Pulse 82   Resp 16   Ht 5\' 3"  (1.6 m)   Wt 157 lb (71.2 kg)   SpO2 98%   BMI 27.81 kg/m   Patient alert and oriented and in no cardiopulmonary distress.  HEENT: No facial asymmetry, EOMI,     Neck supple .  Chest: Clear to auscultation bilaterally.  CVS: S1, S2 no murmurs, no S3.Regular rate.   Ext: No edema  MS: decreased  ROM spine, shoulders, hips and knees.  Skin: Intact, no ulcerations or rash noted.  Psych: Good eye contact, normal affect. Memory intact not anxious or depressed appearing.  CNS: CN 2-12 intact, power,  normal throughout.no focal deficits noted.   Assessment & Plan  Essential hypertension Controlled, no change in medication DASH diet and commitment to daily physical activity for a minimum of 30 minutes discussed and encouraged, as a part of hypertension management. The importance of attaining a  healthy weight is also discussed.  BP/Weight 02/11/2021 12/16/2020 12/10/2020 03/28/2020 12/28/2019 05/31/2019 46/27/0350  Systolic BP 093 818 299 371 696 789 381  Diastolic BP 82 97 90 82 82 82 82  Wt. (Lbs) 157 - 154.12 160 160 160 160  BMI 27.81 - 25.65 26.63 26.63 26.63 26.63       Depression with anxiety Controlled, no change in medication

## 2021-02-19 ENCOUNTER — Telehealth: Payer: Self-pay | Admitting: Family Medicine

## 2021-02-19 ENCOUNTER — Ambulatory Visit (INDEPENDENT_AMBULATORY_CARE_PROVIDER_SITE_OTHER): Payer: Medicare Other | Admitting: Family Medicine

## 2021-02-19 ENCOUNTER — Other Ambulatory Visit: Payer: Self-pay

## 2021-02-19 ENCOUNTER — Encounter: Payer: Self-pay | Admitting: Family Medicine

## 2021-02-19 DIAGNOSIS — J019 Acute sinusitis, unspecified: Secondary | ICD-10-CM | POA: Diagnosis not present

## 2021-02-19 DIAGNOSIS — R051 Acute cough: Secondary | ICD-10-CM | POA: Diagnosis not present

## 2021-02-19 DIAGNOSIS — R059 Cough, unspecified: Secondary | ICD-10-CM | POA: Insufficient documentation

## 2021-02-19 MED ORDER — DOXYCYCLINE HYCLATE 100 MG PO TABS: 100.0000 mg | ORAL_TABLET | Freq: Two times a day (BID) | ORAL | 0 refills | Status: DC

## 2021-02-19 NOTE — Telephone Encounter (Signed)
Pt called in to see if she could get prescription for sinus meds sent in. Having terrible sinus issues

## 2021-02-19 NOTE — Progress Notes (Signed)
Virtual Visit via Telephone Note  I connected with Sara Woods on 02/19/21 at  2:00 PM EDT by telephone and verified that I am speaking with the correct person using two identifiers.  Location: Patient: home Provider: office   I discussed the limitations, risks, security and privacy concerns of performing an evaluation and management service by telephone and the availability of in person appointments. I also discussed with the patient that there may be a patient responsible charge related to this service. The patient expressed understanding and agreed to proceed.   History of Present Illness: 3 day pressure across forehead and under both esyes x 3 days, fever over 100, yellow geen drainage from both nostrils. Both ears hurt , eye fluttering, intermittent cough green sputum   Observations/Objective: There were no vitals taken for this visit. Good communication with no confusion and intact memory. Alert and oriented x 3 No signs of respiratory distress during speech   Assessment and Plan: Sinusitis, acute Acute symptom onset, antibiotic prescribed, nasal/ sinus drainage to be brought in for c/s  Cough 3 day h/o cough , fever , chills, green sputum ,need to submit c/s, alsoantibiotics are prescribed, pt to have covid testing in the am   Follow Up Instructions:    I discussed the assessment and treatment plan with the patient. The patient was provided an opportunity to ask questions and all were answered. The patient agreed with the plan and demonstrated an understanding of the instructions.   The patient was advised to call back or seek an in-person evaluation if the symptoms worsen or if the condition fails to improve as anticipated.  I provided 11 minutes of non-face-to-face time during this encounter.   Tula Nakayama, MD

## 2021-02-19 NOTE — Assessment & Plan Note (Signed)
Acute symptom onset, antibiotic prescribed, nasal/ sinus drainage to be brought in for c/s

## 2021-02-19 NOTE — Telephone Encounter (Signed)
Spoke with Alvester Chou to Advise

## 2021-02-19 NOTE — Assessment & Plan Note (Signed)
3 day h/o cough , fever , chills, green sputum ,need to submit c/s, alsoantibiotics are prescribed, pt to have covid testing in the am

## 2021-02-19 NOTE — Telephone Encounter (Signed)
She won't prescribe anything without a visit- can be telephone

## 2021-02-19 NOTE — Patient Instructions (Addendum)
F/U as before  Call if you need me sooner, you should recover fully on completion of the antibiotic, call if worsening or not better please  Please come to the office tomorrow morning for a covid swab,also you will receive a container to collect a specimen of sputum to take to the lab for culture  A 10 day course of doxycycline is prescribed for sinus infection  Thanks for choosing [facility], we consider it a privelige to serve you.

## 2021-02-20 ENCOUNTER — Ambulatory Visit: Payer: Medicare Other

## 2021-02-21 ENCOUNTER — Other Ambulatory Visit: Payer: Self-pay

## 2021-02-21 DIAGNOSIS — J019 Acute sinusitis, unspecified: Secondary | ICD-10-CM | POA: Diagnosis not present

## 2021-02-21 DIAGNOSIS — R051 Acute cough: Secondary | ICD-10-CM | POA: Diagnosis not present

## 2021-02-23 LAB — SARS-COV-2, NAA 2 DAY TAT

## 2021-02-23 LAB — NOVEL CORONAVIRUS, NAA: SARS-CoV-2, NAA: NOT DETECTED

## 2021-02-24 DIAGNOSIS — R051 Acute cough: Secondary | ICD-10-CM | POA: Diagnosis not present

## 2021-02-25 NOTE — Progress Notes (Signed)
Tried to call pt on both numbers we have no answer and no way to leave message.

## 2021-02-26 LAB — GRAM STAIN W/SPUTUM CULT RFLX: White Blood Cells: NONE SEEN

## 2021-03-03 ENCOUNTER — Other Ambulatory Visit: Payer: Self-pay | Admitting: Family Medicine

## 2021-03-10 ENCOUNTER — Other Ambulatory Visit: Payer: Self-pay

## 2021-03-10 MED ORDER — ROSUVASTATIN CALCIUM 40 MG PO TABS: 40.0000 mg | ORAL_TABLET | Freq: Every day | ORAL | 1 refills | Status: DC

## 2021-03-10 MED ORDER — LISINOPRIL 40 MG PO TABS: 40.0000 mg | ORAL_TABLET | Freq: Every day | ORAL | 1 refills | Status: DC

## 2021-03-14 DIAGNOSIS — Z20828 Contact with and (suspected) exposure to other viral communicable diseases: Secondary | ICD-10-CM | POA: Diagnosis not present

## 2021-03-25 DIAGNOSIS — Z23 Encounter for immunization: Secondary | ICD-10-CM | POA: Diagnosis not present

## 2021-03-31 ENCOUNTER — Ambulatory Visit (INDEPENDENT_AMBULATORY_CARE_PROVIDER_SITE_OTHER): Payer: Medicare Other

## 2021-03-31 ENCOUNTER — Other Ambulatory Visit: Payer: Self-pay

## 2021-03-31 DIAGNOSIS — Z0000 Encounter for general adult medical examination without abnormal findings: Secondary | ICD-10-CM

## 2021-03-31 NOTE — Progress Notes (Signed)
Subjective:   Sara Woods is a 75 y.o. female who presents for Medicare Annual (Subsequent) preventive examination. I connected with  Richarda Overlie on 03/31/21 by a audio enabled telemedicine application and verified that I am speaking with the correct person using two identifiers.  Patient Location: Home  Provider Location: Home Office  I discussed the limitations of evaluation and management by telemedicine. The patient expressed understanding and agreed to proceed.   Review of Systems    Defer To PCP Cardiac Risk Factors include: hypertension     Objective:    There were no vitals filed for this visit. There is no height or weight on file to calculate BMI.  Advanced Directives 03/31/2021 03/28/2020 03/28/2019 11/01/2018 10/26/2018 03/22/2018 01/06/2017  Does Patient Have a Medical Advance Directive? Yes;No Yes No;Yes Yes Yes Yes;No No  Type of Advance Directive - Healthcare Power of [place];Living will Living will;Healthcare Power of Attorney Living will Living will - -  Does patient want to make changes to medical advance directive? No - Patient declined - - No - Patient declined No - Patient declined No - Patient declined -  Copy of [place] in Chart? - No - copy requested - No - copy requested No - copy requested - -  Would patient like information on creating a medical advance directive? No - Patient declined - - - - Yes (ED - Information included in AVS) No - Patient declined    Current Medications (verified) Outpatient Encounter Medications as of 03/31/2021  Medication Sig   amLODipine (NORVASC) 2.5 MG tablet Take one every evening at 8 pm for blood pressure   Calcium-Vitamin D (CALTRATE 600 PLUS-VIT D PO) Take 2 tablets by mouth daily.   doxepin (SINEQUAN) 10 MG capsule TAKE TWO CAPSULES BY MOUTH AT BEDTIME   fluticasone (FLONASE) 50 MCG/ACT nasal spray place TWO SPRAYS into BOTH nostrils DAILY   gabapentin (NEURONTIN) 300 MG capsule TAKE ONE  CAPSULE BY MOUTH TWICE DAILY AS NEEDED FOR PAIN   KRILL OIL 1000 MG CAPS Take 1 capsule by mouth daily.   lisinopril (ZESTRIL) 40 MG tablet Take 1 tablet (40 mg total) by mouth daily.   meclizine (ANTIVERT) 25 MG tablet TAKE 1 TABLET BY MOUTH THREE TIMES DAILY AS NEEDED FOR dizziness   multivitamin (THERAGRAN) per tablet Take 1 tablet by mouth daily.   rosuvastatin (CRESTOR) 40 MG tablet Take 1 tablet (40 mg total) by mouth daily.   topiramate (TOPAMAX) 50 MG tablet TAKE 1 TABLET BY MOUTH TWICE DAILY   diclofenac (VOLTAREN) 50 MG EC tablet TAKE 1 TABLET BY MOUTH TWICE DAILY (Patient not taking: Reported on 03/31/2021)   doxycycline (VIBRA-TABS) 100 MG tablet Take 1 tablet (100 mg total) by mouth 2 (two) times daily. (Patient not taking: Reported on 03/31/2021)   No facility-administered encounter medications on file as of 03/31/2021.    Allergies (verified) Adhesive [tape], Penicillins, and Sulfonamide derivatives   History: Past Medical History:  Diagnosis Date   Allergic rhinitis    Anxiety    Arthritis    Depression approx 1983   History of migraines    Hyperlipidemia approx 2006   Hypertension approx 2000   MRSA infection    Recurrent abcesses to legs about 8 years ago   Recurrent miscarriages due to luteal phase defect, not pregnant    5 miscarriages with 1 living adult son   Past Surgical History:  Procedure Laterality Date   BREAST EXCISIONAL BIOPSY Left  ETHMOIDECTOMY Right 11/01/2018   Procedure: RIGHT TOTAL ETHMOIDECTOMY;  Surgeon: Leta Baptist, MD;  Location: [place];  Service: ENT;  Laterality: Right;   EYE SURGERY Left 01/13/2021   FRONTAL SINUS EXPLORATION Right 11/01/2018   Procedure: FRONTAL SINUS EXPLORATION;  Surgeon: Leta Baptist, MD;  Location: [place];  Service: ENT;  Laterality: Right;   MAXILLARY ANTROSTOMY Right 11/01/2018   Procedure: RIGHT ENDOSCOPIC MAXILLARY ANTROSTOMY WITH TISSUE REMOVAL;  Surgeon: Leta Baptist, MD;   Location: [place];  Service: ENT;  Laterality: Right;   NASAL SEPTOPLASTY W/ TURBINOPLASTY N/A 11/01/2018   Procedure: NASAL SEPTOPLASTY WITH TURBINATE REDUCTION;  Surgeon: Leta Baptist, MD;  Location: [place];  Service: ENT;  Laterality: N/A;   SINUS ENDO WITH FUSION N/A 11/01/2018   Procedure: SINUS ENDO WITH FUSION;  Surgeon: Leta Baptist, MD;  Location: [place];  Service: ENT;  Laterality: N/A;   SKIN CANCER DESTRUCTION     Back of left leg, right shoulder   SPHENOIDECTOMY Right 11/01/2018   Procedure: RIGHT SPHENOIDECTOMY WITH TISSUE REMOVAL;  Surgeon: Leta Baptist, MD;  Location: [place];  Service: ENT;  Laterality: Right;   TONSILLECTOMY     Childhood   TUBAL LIGATION     TUMOR EXCISION     Tongue   Family History  Problem Relation Age of Onset   Prostate cancer Father        metastic, 04/2006   Obesity Sister    Fibromyalgia Sister    Osteoporosis Mother    Cerebrovascular Disease Mother    Brain cancer Mother    Cerebrovascular Disease Brother    Stroke Son        x3, Vertebral artery dissection, January 2009   Hypertension Son    Social History   Socioeconomic History   Marital status: Married    Spouse name: Alvester Chou   Number of children: 1   Years of education: 12+   Highest education level: 12th grade  Occupational History   Occupation: retired   Tobacco Use   Smoking status: Never   Smokeless tobacco: Never  Vaping Use   Vaping Use: Never used  Substance and Sexual Activity   Alcohol use: No   Drug use: No   Sexual activity: Not on file  Other Topics Concern   Not on file  Social History Narrative   Married, lives with spouse   Spouse admitted for lumbar surgery         Social Determinants of Health   Financial Resource Strain: Low Risk    Difficulty of Paying Living Expenses: Not hard at all  Food Insecurity: No Food Insecurity   Worried About Charity fundraiser in the Last Year:  Never true   Arboriculturist in the Last Year: Never true  Transportation Needs: No Transportation Needs   Lack of Transportation (Medical): No   Lack of Transportation (Non-Medical): No  Physical Activity: Insufficiently Active   Days of Exercise per Week: 3 days   Minutes of Exercise per Session: 30 min  Stress: No Stress Concern Present   Feeling of Stress : Not at all  Social Connections: Moderately Integrated   Frequency of Communication with Friends and Family: More than three times a week   Frequency of Social Gatherings with Friends and Family: Once a week   Attends Religious Services: More than 4 times per year   Active Member of Clubs or Organizations: No  Attends Archivist Meetings: Never   Marital Status: Married    Tobacco Counseling Counseling given: Not Answered   Clinical Intake:  Pre-visit preparation completed: Yes  Pain : No/denies pain     Nutritional Risks: None Diabetes: No  How often do you need to have someone help you when you read instructions, pamphlets, or other written materials from your doctor or pharmacy?: 1 - Never What is the last grade level you completed in school?: 12th  Diabetic?no  Interpreter Needed?: No  Information entered by :: Judeen Hammans CMA   Activities of Daily Living In your present state of health, do you have any difficulty performing the following activities: 03/31/2021  Hearing? N  Vision? N  Difficulty concentrating or making decisions? N  Walking or climbing stairs? N  Dressing or bathing? N  Doing errands, shopping? N  Preparing Food and eating ? N  Using the Toilet? N  In the past six months, have you accidently leaked urine? N  Do you have problems with loss of bowel control? N  Managing your Medications? N  Managing your Finances? N  Housekeeping or managing your Housekeeping? N  Some recent data might be hidden    Patient Care Team: Fayrene Helper, MD as PCP - Danella Sensing, MD as Referring Physician (Unknown Physician Specialty)  Indicate any recent Medical Services you may have received from other than [facility] providers in the past year (date may be approximate).     Assessment:   This is a routine wellness examination for Sara Woods.  Hearing/Vision screen No results found.  Dietary issues and exercise activities discussed: Current Exercise Habits: Home exercise routine, Type of exercise: walking, Time (Minutes): 30, Frequency (Times/Week): 5, Weekly Exercise (Minutes/Week): 150, Intensity: Moderate, Exercise limited by: None identified   Goals Addressed             This Visit's Progress    COMPLETED: DIET - EAT MORE FRUITS AND VEGETABLES       Exercise 3x per week (30 min per time)   On track    Recommend starting a routine exercise program at least 3 days a week for 30-45 minutes at a time as tolerated.        Depression Screen PHQ 2/9 Scores 03/31/2021 02/11/2021 12/10/2020 05/22/2020 03/28/2020 03/28/2020 05/31/2019  PHQ - 2 Score 0 0 0 0 0 0 0  PHQ- 9 Score 0 0 - - - - 0    Fall Risk Fall Risk  03/31/2021 02/11/2021 12/10/2020 05/22/2020 03/28/2020  Falls in the past year? 0 0 0 0 0  Number falls in past yr: 0 - 0 0 0  Injury with Fall? 0 - 0 0 0  Risk Factor Category  - - - - -  Risk for fall due to : - - - - No Fall Risks  Follow up Falls prevention discussed - - - Falls evaluation completed    FALL RISK PREVENTION PERTAINING TO THE HOME:  Any stairs in or around the home? Yes  If so, are there any without handrails? Yes  Home free of loose throw rugs in walkways, pet beds, electrical cords, etc? No  Adequate lighting in your home to reduce risk of falls? Yes   ASSISTIVE DEVICES UTILIZED TO PREVENT FALLS:  Life alert? Yes  Use of a cane, walker or w/c? No  Grab bars in the bathroom? Yes  Shower chair or bench in shower? No  Elevated toilet seat or a handicapped toilet? No  Cognitive Function:     6CIT Screen 03/31/2021  03/28/2020 03/28/2019 03/22/2018 01/06/2017  What Year? 0 points 0 points 0 points 0 points 0 points  What month? 0 points 0 points 0 points 0 points 0 points  What time? 0 points 0 points 0 points 0 points 0 points  Count back from 20 0 points 0 points 0 points 0 points 0 points  Months in reverse 2 points 0 points 0 points 0 points 0 points  Repeat phrase 0 points 0 points 0 points 0 points 0 points  Total Score 2 0 0 0 0    Immunizations Immunization History  Administered Date(s) Administered   Fluad Quad(high Dose 65+) 02/11/2021   H1N1 04/19/2008   Influenza Split 01/30/2011, 02/24/2012   Influenza Whole 02/23/2007, 02/23/2008   Influenza,inj,Quad PF,6+ Mos 02/27/2013, 04/19/2014, 02/26/2015, 03/23/2016, 01/06/2017, 12/23/2017   Influenza-Unspecified 02/28/2020   Moderna Sars-Covid-2 Vaccination 07/06/2019, 07/30/2019, 04/29/2020, 03/25/2021   Pneumococcal Conjugate-13 12/06/2013   Pneumococcal Polysaccharide-23 12/19/2010   Td 02/24/2010   Zoster, Live 12/19/2010    TDAP status: Due, Education has been provided regarding the importance of this vaccine. Advised may receive this vaccine at local pharmacy or Health Dept. Aware to provide a copy of the vaccination record if obtained from local pharmacy or Health Dept. Verbalized acceptance and understanding.  Flu Vaccine status: Up to date  Pneumococcal vaccine status: Up to date  Covid-19 vaccine status: Information provided on how to obtain vaccines.   Qualifies for Shingles Vaccine? Yes   Zostavax completed Yes   Shingrix Completed?: No.    Education has been provided regarding the importance of this vaccine. Patient has been advised to call insurance company to determine out of pocket expense if they have not yet received this vaccine. Advised may also receive vaccine at local pharmacy or Health Dept. Verbalized acceptance and understanding.  Screening Tests Health Maintenance  Topic Date Due   Zoster Vaccines- Shingrix  (1 of 2) Never done   COLONOSCOPY (Pts 45-63yrs Insurance coverage will need to be confirmed)  04/02/2021 (Originally 08/04/2020)   TETANUS/TDAP  06/04/2021 (Originally 02/25/2020)   COVID-19 Vaccine (5 - Booster for Moderna series) 05/20/2021   Pneumonia Vaccine 95+ Years old  Completed   INFLUENZA VACCINE  Completed   DEXA SCAN  Completed   Hepatitis C Screening  Completed   HPV VACCINES  Aged Out    Health Maintenance  Health Maintenance Due  Topic Date Due   Zoster Vaccines- Shingrix (1 of 2) Never done    Colorectal cancer screening: Type of screening: Colonoscopy. Completed 08/05/2010. Repeat every 10 years  Mammogram status: No longer required due to age.  Bone Density status: Completed 07/19/2013. Results reflect: Bone density results: OSTEOPENIA. Repeat every 0 years.  Lung Cancer Screening: (Low Dose CT Chest recommended if Age 56-80 years, 30 pack-year currently smoking OR have quit w/in 15years.) does notqualify.   Lung Cancer Screening Referral: n/a  Additional Screening:  Hepatitis C Screening: does qualify; Completed 03/15/2015  Vision Screening: Recommended annual ophthalmology exams for early detection of glaucoma and other disorders of the eye. Is the patient up to date with their annual eye exam?  Yes  Who is the provider or what is the name of the office in which the patient attends annual eye exams? Pt can not remember name If pt is not established with a provider, would they like to be referred to a provider to establish care?  N/a .   Dental Screening: Recommended annual  dental exams for proper oral hygiene  Community Resource Referral / Chronic Care Management: CRR required this visit?  No   CCM required this visit?  No      Plan:     I have personally reviewed and noted the following in the patient's chart:   Medical and social history Use of alcohol, tobacco or illicit drugs  Current medications and supplements including opioid  prescriptions.  Functional ability and status Nutritional status Physical activity Advanced directives List of other physicians Hospitalizations, surgeries, and ER visits in previous 12 months Vitals Screenings to include cognitive, depression, and falls Referrals and appointments  In addition, I have reviewed and discussed with patient certain preventive protocols, quality metrics, and best practice recommendations. A written personalized care plan for preventive services as well as general preventive health recommendations were provided to patient.     Earline Mayotte, [place]   03/31/2021   Nurse Notes:  Sara Woods , Thank you for taking time to come for your Medicare Wellness Visit. I appreciate your ongoing commitment to your health goals. Please review the following plan we discussed and let me know if I can assist you in the future.   These are the goals we discussed:  Goals      Exercise 3x per week (30 min per time)     Recommend starting a routine exercise program at least 3 days a week for 30-45 minutes at a time as tolerated.          This is a list of the screening recommended for you and due dates:  Health Maintenance  Topic Date Due   Zoster (Shingles) Vaccine (1 of 2) Never done   Colon Cancer Screening  04/02/2021*   Tetanus Vaccine  06/04/2021*   COVID-19 Vaccine (5 - Booster for Moderna series) 05/20/2021   Pneumonia Vaccine  Completed   Flu Shot  Completed   DEXA scan (bone density measurement)  Completed   Hepatitis C Screening: USPSTF Recommendation to screen - Ages 68-79 yo.  Completed   HPV Vaccine  Aged Out  *Topic was postponed. The date shown is not the original due date.

## 2021-04-02 ENCOUNTER — Other Ambulatory Visit: Payer: Self-pay | Admitting: Family Medicine

## 2021-04-30 ENCOUNTER — Other Ambulatory Visit: Payer: Self-pay | Admitting: Family Medicine

## 2021-05-01 DIAGNOSIS — Z20828 Contact with and (suspected) exposure to other viral communicable diseases: Secondary | ICD-10-CM | POA: Diagnosis not present

## 2021-05-07 ENCOUNTER — Other Ambulatory Visit: Payer: Self-pay | Admitting: Family Medicine

## 2021-06-17 ENCOUNTER — Other Ambulatory Visit: Payer: Self-pay

## 2021-06-17 ENCOUNTER — Ambulatory Visit (INDEPENDENT_AMBULATORY_CARE_PROVIDER_SITE_OTHER): Payer: Medicare Other | Admitting: Family Medicine

## 2021-06-17 ENCOUNTER — Encounter: Payer: Self-pay | Admitting: Family Medicine

## 2021-06-17 VITALS — BP 160/80 | HR 87 | Resp 16 | Ht 63.0 in | Wt 163.0 lb

## 2021-06-17 DIAGNOSIS — I10 Essential (primary) hypertension: Secondary | ICD-10-CM

## 2021-06-17 DIAGNOSIS — E559 Vitamin D deficiency, unspecified: Secondary | ICD-10-CM | POA: Diagnosis not present

## 2021-06-17 DIAGNOSIS — J309 Allergic rhinitis, unspecified: Secondary | ICD-10-CM | POA: Diagnosis not present

## 2021-06-17 DIAGNOSIS — E785 Hyperlipidemia, unspecified: Secondary | ICD-10-CM

## 2021-06-17 DIAGNOSIS — R0981 Nasal congestion: Secondary | ICD-10-CM | POA: Insufficient documentation

## 2021-06-17 DIAGNOSIS — G43109 Migraine with aura, not intractable, without status migrainosus: Secondary | ICD-10-CM

## 2021-06-17 MED ORDER — AMLODIPINE BESYLATE 5 MG PO TABS: 5.0000 mg | ORAL_TABLET | Freq: Every day | ORAL | 3 refills | Status: DC

## 2021-06-17 NOTE — Assessment & Plan Note (Signed)
Chronic and c/o bad taste and discoloration of drainage, denies fever , chills or malaise. C/s to be obtained on specimen, encouraed N/S flushes and daily use of allergy meds

## 2021-06-17 NOTE — Assessment & Plan Note (Signed)
Controlled, no change in medication  

## 2021-06-17 NOTE — Assessment & Plan Note (Signed)
Hyperlipidemia:Low fat diet discussed and encouraged.   Lipid Panel  Lab Results  Component Value Date   CHOL 148 06/26/2020   HDL 54 06/26/2020   LDLCALC 77 06/26/2020   TRIG 90 06/26/2020   CHOLHDL 2.7 06/26/2020     Updated lab needed at/ before next visit.

## 2021-06-17 NOTE — Patient Instructions (Addendum)
F/U to re evaluate blood pressure in 3 months, call if you need me sooner  New HIGHER dose of amlodipine is 5 mg ONE daily. You may take TWO 2.5 mg tablets together until what you have is finished. After that only fil amlodipine 5 mg ONE tablet once daily for blood pressure  Please submit sinus drainage to the lab for culture as soon as possible  Labs today, CBC, lipid, cmp and EGFR , TSh and vit D  Thanks for choosing [facility], we consider it a privelige to serve you.

## 2021-06-17 NOTE — Assessment & Plan Note (Addendum)
Uncontrolled, inc amlodoipine to 5 mg DASH diet and commitment to daily physical activity for a minimum of 30 minutes discussed and encouraged, as a part of hypertension management. The importance of attaining a healthy weight is also discussed.  BP/Weight 06/17/2021 02/11/2021 12/16/2020 12/10/2020 03/28/2020 12/28/2019 9/49/9718  Systolic BP 209 906 893 406 840 335 331  Diastolic BP 80 82 97 90 82 82 82  Wt. (Lbs) 163 157 - 154.12 160 160 160  BMI 28.87 27.81 - 25.65 26.63 26.63 26.63

## 2021-06-17 NOTE — Progress Notes (Signed)
° °  Sara Woods     MRN: [id number]      DOB: [birth year]   HPI Sara Woods is here for follow up and re-evaluation of chronic medical conditions, medication management and review of any available recent lab and radiology data.  Preventive health is updated, specifically  Cancer screening and Immunization.   Questions or concerns regarding consultations or procedures which the PT has had in the interim are  addressed. The PT denies any adverse reactions to current medications since the last visit.  C/o chronic sinus drainage and facial pressure Incrased stress due to ill health of her only living child, potential cancer  ROS Denies recent fever or chills.  Denies chest congestion, productive cough or wheezing. Denies chest pains, palpitations and leg swelling Denies abdominal pain, nausea, vomiting,diarrhea or constipation.   Denies dysuria, frequency, hesitancy or incontinence. Denies joint pain, swelling and limitation in mobility. Denies headaches, seizures, numbness, or tingling. Denies skin break down or rash.   PE  BP (!) 160/80    Pulse 87    Resp 16    Ht 5\' 3"  (1.6 m)    Wt 163 lb (73.9 kg)    SpO2 97%    BMI 28.87 kg/m   Patient alert and oriented and in no cardiopulmonary distress.  HEENT: No facial asymmetry, EOMI,     Neck supple .  Chest: Clear to auscultation bilaterally.  CVS: S1, S2 no murmurs, no S3.Regular rate.  ABD: Soft non tender.   Ext: No edema  MS: Adequate ROM spine, shoulders, hips and knees.  Skin: Intact, no ulcerations or rash noted.  Psych: Good eye contact, normal affect. Memory intact not anxious mildy tearful at times and mildly depressed appearing.  CNS: CN 2-12 intact, power,  normal throughout.no focal deficits noted.   Assessment & Plan  Essential hypertension Uncontrolled, inc amlodoipine to 5 mg DASH diet and commitment to daily physical activity for a minimum of 30 minutes discussed and encouraged, as a part of hypertension  management. The importance of attaining a healthy weight is also discussed.  BP/Weight 06/17/2021 02/11/2021 12/16/2020 12/10/2020 03/28/2020 12/28/2019 2/37/6283  Systolic BP 151 761 607 371 062 694 854  Diastolic BP 80 82 97 90 82 82 82  Wt. (Lbs) 163 157 - 154.12 160 160 160  BMI 28.87 27.81 - 25.65 26.63 26.63 26.63       Sinus congestion Chronic and c/o bad taste and discoloration of drainage, denies fever , chills or malaise. C/s to be obtained on specimen, encouraed N/S flushes and daily use of allergy meds  Hyperlipidemia LDL goal <100 Hyperlipidemia:Low fat diet discussed and encouraged.   Lipid Panel  Lab Results  Component Value Date   CHOL 148 06/26/2020   HDL 54 06/26/2020   LDLCALC 77 06/26/2020   TRIG 90 06/26/2020   CHOLHDL 2.7 06/26/2020     Updated lab needed at/ before next visit.   Migraine with aura Controlled, no change in medication

## 2021-06-17 NOTE — Assessment & Plan Note (Deleted)
Continue daily benadryl

## 2021-06-18 DIAGNOSIS — R0981 Nasal congestion: Secondary | ICD-10-CM | POA: Diagnosis not present

## 2021-06-18 LAB — LIPID PANEL

## 2021-06-19 LAB — CMP14+EGFR
ALT: 49 IU/L — ABNORMAL HIGH (ref 0–32)
AST: 33 IU/L (ref 0–40)
Albumin/Globulin Ratio: 2.8 — ABNORMAL HIGH (ref 1.2–2.2)
Albumin: 4.8 g/dL — ABNORMAL HIGH (ref 3.7–4.7)
Alkaline Phosphatase: 100 IU/L (ref 44–121)
BUN/Creatinine Ratio: 30 — ABNORMAL HIGH (ref 12–28)
BUN: 26 mg/dL (ref 8–27)
Bilirubin Total: 0.2 mg/dL (ref 0.0–1.2)
CO2: 20 mmol/L (ref 20–29)
Calcium: 10 mg/dL (ref 8.7–10.3)
Chloride: 107 mmol/L — ABNORMAL HIGH (ref 96–106)
Creatinine, Ser: 0.86 mg/dL (ref 0.57–1.00)
Globulin, Total: 1.7 g/dL (ref 1.5–4.5)
Glucose: 90 mg/dL (ref 70–99)
Potassium: 4.2 mmol/L (ref 3.5–5.2)
Sodium: 145 mmol/L — ABNORMAL HIGH (ref 134–144)
Total Protein: 6.5 g/dL (ref 6.0–8.5)
eGFR: 70 mL/min/{1.73_m2} (ref >59)

## 2021-06-19 LAB — LIPID PANEL
Chol/HDL Ratio: 3.1 ratio (ref 0.0–4.4)
Cholesterol, Total: 149 mg/dL (ref 100–199)
HDL: 48 mg/dL (ref >39)
LDL Chol Calc (NIH): 69 mg/dL (ref 0–99)
Triglycerides: 192 mg/dL — ABNORMAL HIGH (ref 0–149)
VLDL Cholesterol Cal: 32 mg/dL (ref 5–40)

## 2021-06-19 LAB — CBC
Hematocrit: 42.1 % (ref 34.0–46.6)
Hemoglobin: 13.8 g/dL (ref 11.1–15.9)
MCH: 31.1 pg (ref 26.6–33.0)
MCHC: 32.8 g/dL (ref 31.5–35.7)
MCV: 95 fL (ref 79–97)
Platelets: 279 10*3/uL (ref 150–450)
RBC: 4.44 x10E6/uL (ref 3.77–5.28)
RDW: 12.1 % (ref 11.7–15.4)
WBC: 7.4 10*3/uL (ref 3.4–10.8)

## 2021-06-19 LAB — TSH: TSH: 2.03 u[IU]/mL (ref 0.450–4.500)

## 2021-06-19 LAB — VITAMIN D 25 HYDROXY (VIT D DEFICIENCY, FRACTURES): Vit D, 25-Hydroxy: 104 ng/mL — ABNORMAL HIGH (ref 30.0–100.0)

## 2021-06-20 LAB — GRAM STAIN W/SPUTUM CULT RFLX

## 2021-07-24 ENCOUNTER — Other Ambulatory Visit: Payer: Self-pay | Admitting: Family Medicine

## 2021-08-05 DIAGNOSIS — Z20828 Contact with and (suspected) exposure to other viral communicable diseases: Secondary | ICD-10-CM | POA: Diagnosis not present

## 2021-08-20 ENCOUNTER — Ambulatory Visit (INDEPENDENT_AMBULATORY_CARE_PROVIDER_SITE_OTHER): Payer: Medicare Other | Admitting: Family Medicine

## 2021-08-20 ENCOUNTER — Encounter: Payer: Self-pay | Admitting: Family Medicine

## 2021-08-20 VITALS — BP 130/83 | HR 93 | Ht 63.0 in | Wt 158.1 lb

## 2021-08-20 DIAGNOSIS — J309 Allergic rhinitis, unspecified: Secondary | ICD-10-CM | POA: Diagnosis not present

## 2021-08-20 DIAGNOSIS — F5104 Psychophysiologic insomnia: Secondary | ICD-10-CM | POA: Diagnosis not present

## 2021-08-20 DIAGNOSIS — G43109 Migraine with aura, not intractable, without status migrainosus: Secondary | ICD-10-CM

## 2021-08-20 DIAGNOSIS — E785 Hyperlipidemia, unspecified: Secondary | ICD-10-CM

## 2021-08-20 DIAGNOSIS — M129 Arthropathy, unspecified: Secondary | ICD-10-CM | POA: Diagnosis not present

## 2021-08-20 DIAGNOSIS — I10 Essential (primary) hypertension: Secondary | ICD-10-CM | POA: Diagnosis not present

## 2021-08-20 NOTE — Patient Instructions (Addendum)
F/u in 6 months, flu vaccine at visit. ? ?THANKFUL that you have good news about your health ? ? ?Fasting lipid, cmp and EGFR 1 week before next visit ? ? ?Think about what you will eat, plan ahead. ?Choose " clean, green, fresh or frozen" over canned, processed or packaged foods which are more sugary, salty and fatty. ?70 to 75% of food eaten should be vegetables and fruit. ?Three meals at set times with snacks allowed between meals, but they must be fruit or vegetables. ?Aim to eat over a 12 hour period , example 7 am to 7 pm, and STOP after  your last meal of the day. ?Drink water,generally about 64 ounces per day, no other drink is as healthy. Fruit juice is best enjoyed in a healthy way, by EATING the fruit. ? ? ?Thanks for choosing [facility], we consider it a privelige to serve you. ? ?

## 2021-08-21 ENCOUNTER — Encounter: Payer: Self-pay | Admitting: Family Medicine

## 2021-08-21 DIAGNOSIS — G4700 Insomnia, unspecified: Secondary | ICD-10-CM | POA: Insufficient documentation

## 2021-08-21 NOTE — Assessment & Plan Note (Signed)
Controlled, no change in medication ?DASH diet and commitment to daily physical activity for a minimum of 30 minutes discussed and encouraged, as a part of hypertension management. ?The importance of attaining a healthy weight is also discussed. ? ? ?  08/20/2021  ?  3:15 PM 06/17/2021  ?  3:47 PM 06/17/2021  ?  3:03 PM 02/11/2021  ?  3:41 PM 02/11/2021  ?  3:03 PM 12/16/2020  ?  3:13 PM 12/10/2020  ?  2:59 PM  ?BP/Weight  ?Systolic BP 053 976 734 193 163 152 158  ?Diastolic BP 83 80 80 82 77 97 90  ?Wt. (Lbs) 158.12  163  157  154.12  ?BMI 28.01 kg/m2  28.87 kg/m2  27.81 kg/m2  25.65 kg/m2  ? ? ? ? ?

## 2021-08-21 NOTE — Assessment & Plan Note (Signed)
Pain is controlled with gabapentin, continue same ?

## 2021-08-21 NOTE — Progress Notes (Signed)
? ?  Sara Woods     MRN: [id number]      DOB: August 10, 1945 ? ? ?HPI ?[name] is here for follow up and re-evaluation of chronic medical conditions, medication management and review of any available recent lab and radiology data.  ?Preventive health is updated, specifically  Cancer screening and Immunization.   ?Questions or concerns regarding consultations or procedures which the PT has had in the interim are  addressed. ?The PT denies any adverse reactions to current medications since the last visit.  ? ?ROS ?Denies recent fever or chills. ?Denies sinus pressure, nasal congestion, ear pain or sore throat. ?Denies chest congestion, productive cough or wheezing. ?Denies chest pains, palpitations and leg swelling ?Denies abdominal pain, nausea, vomiting,diarrhea or constipation.   ?Denies dysuria, frequency, hesitancy or incontinence. ?Denies uncontrolled  joint pain, swelling and limitation in mobility. ?Denies headaches, seizures, numbness, or tingling. ?Denies depression, anxiety or insomnia. ? ? ? ?PE ? ?BP 130/83   Pulse 93   Ht '5\' 3"'$  (1.6 m)   Wt 158 lb 1.9 oz (71.7 kg)   SpO2 96%   BMI 28.01 kg/m?  ? ?Patient alert and oriented and in no cardiopulmonary distress. ? ?HEENT: No facial asymmetry, EOMI,     Neck supple . ? ?Chest: Clear to auscultation bilaterally. ? ?CVS: S1, S2 no murmurs, no S3.Regular rate. ? ?ABD: Soft non tender.  ? ?Ext: No edema ? ?MS: Adequate ROM spine, shoulders, hips and knees. ? ?Skin: Intact, no ulcerations or rash noted. ? ? ?Psych: Good eye contact, normal affect. Memory intact not anxious or depressed appearing. ? ?CNS: CN 2-12 intact, power,  normal throughout.no focal deficits noted. ? ? ?Assessment & Plan ? ?Essential hypertension ?Controlled, no change in medication ?DASH diet and commitment to daily physical activity for a minimum of 30 minutes discussed and encouraged, as a part of hypertension management. ?The importance of attaining a healthy weight is also  discussed. ? ? ?  08/20/2021  ?  3:15 PM 06/17/2021  ?  3:47 PM 06/17/2021  ?  3:03 PM 02/11/2021  ?  3:41 PM 02/11/2021  ?  3:03 PM 12/16/2020  ?  3:13 PM 12/10/2020  ?  2:59 PM  ?BP/Weight  ?Systolic BP 808 811 031 594 163 152 158  ?Diastolic BP 83 80 80 82 77 97 90  ?Wt. (Lbs) 158.12  163  157  154.12  ?BMI 28.01 kg/m2  28.87 kg/m2  27.81 kg/m2  25.65 kg/m2  ? ? ? ? ? ?Hyperlipidemia LDL goal <100 ?Hyperlipidemia:Low fat diet discussed and encouraged. ? ? ?Lipid Panel  ?Lab Results  ?Component Value Date  ? CHOL 149 06/17/2021  ? HDL 48 06/17/2021  ? [place] 69 06/17/2021  ? TRIG 192 (H) 06/17/2021  ? CHOLHDL 3.1 06/17/2021  ? ?Needs to reduce fried and fatty foods ?Updated lab needed at/ before next visit. ? ? ? ? ?Migraine with aura ?Controlled, no change in medication ? ? ?Allergic rhinitis ?Controlled, no change in medication ? ? ?Arthropathy, multiple sites ?Pain is controlled with gabapentin, continue same ? ?Insomnia ?Controlled, no change in medication ?Sleep hygiene reviewed and written information offered also. ?Prescription sent for  medication needed. ? ? ?

## 2021-08-21 NOTE — Assessment & Plan Note (Signed)
Controlled, no change in medication  

## 2021-08-21 NOTE — Assessment & Plan Note (Signed)
Controlled, no change in medication Sleep hygiene reviewed and written information offered also. Prescription sent for  medication needed.  

## 2021-08-21 NOTE — Assessment & Plan Note (Signed)
Hyperlipidemia:Low fat diet discussed and encouraged. ? ? ?Lipid Panel  ?Lab Results  ?Component Value Date  ? CHOL 149 06/17/2021  ? HDL 48 06/17/2021  ? [place] 69 06/17/2021  ? TRIG 192 (H) 06/17/2021  ? CHOLHDL 3.1 06/17/2021  ? ?Needs to reduce fried and fatty foods ?Updated lab needed at/ before next visit. ? ? ? ?

## 2021-08-25 ENCOUNTER — Other Ambulatory Visit: Payer: Self-pay

## 2021-08-25 ENCOUNTER — Telehealth: Payer: Self-pay

## 2021-08-25 ENCOUNTER — Other Ambulatory Visit: Payer: Self-pay | Admitting: Family Medicine

## 2021-08-25 MED ORDER — MECLIZINE HCL 25 MG PO TABS: ORAL_TABLET | ORAL | 5 refills | Status: DC

## 2021-08-25 NOTE — Telephone Encounter (Signed)
Refill sent.

## 2021-08-25 NOTE — Telephone Encounter (Signed)
Pt needs rx for meclizine (ANTIVERT) 25 MG tablet [[id number]]  ?Faxed to [facility] ?

## 2021-09-17 ENCOUNTER — Other Ambulatory Visit: Payer: Self-pay | Admitting: Family Medicine

## 2021-10-16 ENCOUNTER — Other Ambulatory Visit: Payer: Self-pay | Admitting: Family Medicine

## 2022-01-22 ENCOUNTER — Other Ambulatory Visit: Payer: Self-pay | Admitting: Family Medicine

## 2022-02-19 ENCOUNTER — Encounter: Payer: Self-pay | Admitting: Family Medicine

## 2022-02-19 ENCOUNTER — Ambulatory Visit (INDEPENDENT_AMBULATORY_CARE_PROVIDER_SITE_OTHER): Payer: Medicare Other | Admitting: Family Medicine

## 2022-02-19 VITALS — BP 130/80 | HR 81 | Ht 65.0 in | Wt 161.0 lb

## 2022-02-19 DIAGNOSIS — R0981 Nasal congestion: Secondary | ICD-10-CM | POA: Diagnosis not present

## 2022-02-19 DIAGNOSIS — I10 Essential (primary) hypertension: Secondary | ICD-10-CM

## 2022-02-19 DIAGNOSIS — F5104 Psychophysiologic insomnia: Secondary | ICD-10-CM | POA: Diagnosis not present

## 2022-02-19 DIAGNOSIS — E785 Hyperlipidemia, unspecified: Secondary | ICD-10-CM

## 2022-02-19 DIAGNOSIS — J309 Allergic rhinitis, unspecified: Secondary | ICD-10-CM

## 2022-02-19 DIAGNOSIS — Z1231 Encounter for screening mammogram for malignant neoplasm of breast: Secondary | ICD-10-CM

## 2022-02-19 NOTE — Patient Instructions (Addendum)
F/u in 6 months, call if you need me sooner  Fasting lipid, cmp and EGFr as soon as possible  PLEASE get your mammogram ,this is overdue, let us know when we can schedule this  No medication changes at this time  Careful not to fall  Thanks for choosing [facility], we consider it a privelige to serve you.

## 2022-02-21 ENCOUNTER — Other Ambulatory Visit: Payer: Self-pay | Admitting: Family Medicine

## 2022-02-21 ENCOUNTER — Encounter: Payer: Self-pay | Admitting: Family Medicine

## 2022-02-21 NOTE — Assessment & Plan Note (Signed)
Reports chronic sinus drainage and runny nose

## 2022-02-21 NOTE — Assessment & Plan Note (Signed)
Controlled, no change in medication DASH diet and commitment to daily physical activity for a minimum of 30 minutes discussed and encouraged, as a part of hypertension management. The importance of attaining a healthy weight is also discussed.     02/19/2022    2:29 PM 08/20/2021    3:15 PM 06/17/2021    3:47 PM 06/17/2021    3:03 PM 02/11/2021    3:41 PM 02/11/2021    3:03 PM 12/16/2020    3:13 PM  BP/Weight  Systolic BP 742 595 638 756 433 295 188  Diastolic BP 80 83 80 80 82 77 97  Wt. (Lbs) 161 158.12  163  157   BMI 26.79 kg/m2 28.01 kg/m2  28.87 kg/m2  27.81 kg/m2

## 2022-02-21 NOTE — Assessment & Plan Note (Signed)
Controlled, no change in medication  

## 2022-02-21 NOTE — Progress Notes (Signed)
   Sara Woods     MRN: [id number]      DOB: 12-Mar-1946   HPI Sara Woods is here for follow up and re-evaluation of chronic medical conditions, medication management and review of any available recent lab and radiology data.  Preventive health is updated, specifically  Cancer screening and Immunization.  Putting off flu vaccine till after spouse gets treatment next week, statwes she will schedule her overdue mammogram The PT denies any adverse reactions to current medications since the last visit.  There are no new concerns.  There are no specific complaints   ROS Denies recent fever or chills. Denies sinus pressure, nasal congestion, ear pain or sore throat. Denies chest congestion, productive cough or wheezing. Denies chest pains, palpitations and leg swelling Denies abdominal pain, nausea, vomiting,diarrhea or constipation.   Denies dysuria, frequency, hesitancy or incontinence. Denies uncontrolled  joint pain, swelling and limitation in mobility. Denies headaches, seizures, numbness, or tingling. Denies depression, anxiety or insomnia. Denies skin break down or rash.   PE  BP 130/80 (BP Location: Right Arm, Patient Position: Sitting)   Pulse 81   Ht '5\' 5"'$  (1.651 m)   Wt 161 lb (73 kg)   SpO2 98%   BMI 26.79 kg/m   Patient alert and oriented and in no cardiopulmonary distress.  HEENT: No facial asymmetry, EOMI,     Neck supple .  Chest: Clear to auscultation bilaterally.  CVS: S1, S2 no murmurs, no S3.Regular rate.  ABD: Soft non tender.   Ext: No edema  MS: Adequate though educed  ROM spine, shoulders, hips and knees.  Skin: Intact, no ulcerations or rash noted.  Psych: Good eye contact, normal affect. Memory intact not anxious or depressed appearing.  CNS: CN 2-12 intact, power,  normal throughout.no focal deficits noted.   Assessment & Plan  Essential hypertension Controlled, no change in medication DASH diet and commitment to daily physical activity  for a minimum of 30 minutes discussed and encouraged, as a part of hypertension management. The importance of attaining a healthy weight is also discussed.     02/19/2022    2:29 PM 08/20/2021    3:15 PM 06/17/2021    3:47 PM 06/17/2021    3:03 PM 02/11/2021    3:41 PM 02/11/2021    3:03 PM 12/16/2020    3:13 PM  BP/Weight  Systolic BP 734 193 790 240 973 532 992  Diastolic BP 80 83 80 80 82 77 97  Wt. (Lbs) 161 158.12  163  157   BMI 26.79 kg/m2 28.01 kg/m2  28.87 kg/m2  27.81 kg/m2        Insomnia Controlled, no change in medication   Hyperlipidemia LDL goal <100 Hyperlipidemia:Low fat diet discussed and encouraged.   Lipid Panel  Lab Results  Component Value Date   CHOL 149 06/17/2021   HDL 48 06/17/2021   LDLCALC 69 06/17/2021   TRIG 192 (H) 06/17/2021   CHOLHDL 3.1 06/17/2021   Updated lab needed at/ before next visit.     Allergic rhinitis Reports chronic sinus drainage and runny nose  Sinus congestion Reports chroinic congestion, denies fever or chills

## 2022-02-21 NOTE — Assessment & Plan Note (Signed)
Reports chroinic congestion, denies fever or chills

## 2022-02-21 NOTE — Assessment & Plan Note (Signed)
Hyperlipidemia:Low fat diet discussed and encouraged.   Lipid Panel  Lab Results  Component Value Date   CHOL 149 06/17/2021   HDL 48 06/17/2021   LDLCALC 69 06/17/2021   TRIG 192 (H) 06/17/2021   CHOLHDL 3.1 06/17/2021   Updated lab needed at/ before next visit.

## 2022-02-23 ENCOUNTER — Other Ambulatory Visit: Payer: Self-pay | Admitting: Family Medicine

## 2022-03-05 DIAGNOSIS — Z23 Encounter for immunization: Secondary | ICD-10-CM | POA: Diagnosis not present

## 2022-03-09 DIAGNOSIS — Z23 Encounter for immunization: Secondary | ICD-10-CM | POA: Diagnosis not present

## 2022-03-23 ENCOUNTER — Other Ambulatory Visit: Payer: Self-pay | Admitting: Family Medicine

## 2022-04-06 ENCOUNTER — Ambulatory Visit (INDEPENDENT_AMBULATORY_CARE_PROVIDER_SITE_OTHER): Payer: Medicare Other

## 2022-04-06 DIAGNOSIS — Z0000 Encounter for general adult medical examination without abnormal findings: Secondary | ICD-10-CM | POA: Diagnosis not present

## 2022-04-06 NOTE — Progress Notes (Signed)
Subjective:   Sara Woods is a 76 y.o. female who presents for Medicare Annual (Subsequent) preventive examination. I connected with  Richarda Overlie on 04/06/22 by a audio enabled telemedicine application and verified that I am speaking with the correct person using two identifiers.  Patient Location: Home  Provider Location: Office/Clinic  I discussed the limitations of evaluation and management by telemedicine. The patient expressed understanding and agreed to proceed.  Review of Systems           Objective:    There were no vitals filed for this visit. There is no height or weight on file to calculate BMI.     03/31/2021    3:46 PM 03/28/2020    1:14 PM 03/28/2019    1:04 PM 11/01/2018    8:07 AM 10/26/2018   10:34 AM 03/22/2018    1:19 PM 01/06/2017    3:30 PM  Advanced Directives  Does Patient Have a Medical Advance Directive? Yes;No Yes No;Yes Yes Yes Yes;No No  Type of Corporate treasurer of [employer];Living will Living will;Healthcare Power of Attorney Living will Living will    Does patient want to make changes to medical advance directive? No - Patient declined   No - Patient declined No - Patient declined No - Patient declined   Copy of [place] in Chart?  No - copy requested  No - copy requested No - copy requested    Would patient like information on creating a medical advance directive? No - Patient declined     Yes (ED - Information included in AVS) No - Patient declined    Current Medications (verified) Outpatient Encounter Medications as of 04/06/2022  Medication Sig   amLODipine (NORVASC) 5 MG tablet TAKE ONE TABLET BY MOUTH DAILY   Calcium-Vitamin D (CALTRATE 600 PLUS-VIT D PO) Take 2 tablets by mouth daily.   diclofenac (VOLTAREN) 50 MG EC tablet TAKE ONE TABLET BY MOUTH TWICE DAILY   doxepin (SINEQUAN) 10 MG capsule TAKE TWO (2) CAPSULES BY MOUTH AT BEDTIME   fluticasone (FLONASE) 50 MCG/ACT nasal spray place TWO  SPRAYS into BOTH nostrils DAILY   gabapentin (NEURONTIN) 300 MG capsule TAKE ONE CAPSULE BY MOUTH TWICE DAILY AS NEEDED FOR PAIN   KRILL OIL 1000 MG CAPS Take 1 capsule by mouth daily.   lisinopril (ZESTRIL) 40 MG tablet TAKE 1 TABLET BY MOUTH DAILY   meclizine (ANTIVERT) 25 MG tablet TAKE 1 TABLET BY MOUTH THREE TIMES DAILY AS NEEDED FOR dizziness   multivitamin (THERAGRAN) per tablet Take 1 tablet by mouth daily.   rosuvastatin (CRESTOR) 40 MG tablet Take 1 tablet (40 mg total) by mouth daily.   topiramate (TOPAMAX) 50 MG tablet TAKE ONE TABLET BY MOUTH TWICE DAILY   No facility-administered encounter medications on file as of 04/06/2022.    Allergies (verified) Adhesive [tape], Penicillins, and Sulfonamide derivatives   History: Past Medical History:  Diagnosis Date   Allergic rhinitis    Anxiety    Arthritis    Depression approx 1983   History of migraines    Hyperlipidemia approx 2006   Hypertension approx 2000   MRSA infection    Recurrent abcesses to legs about 8 years ago   Recurrent miscarriages due to luteal phase defect, not pregnant    5 miscarriages with 1 living adult son   Past Surgical History:  Procedure Laterality Date   BREAST EXCISIONAL BIOPSY Left    ETHMOIDECTOMY Right 11/01/2018  Procedure: RIGHT TOTAL ETHMOIDECTOMY;  Surgeon: Leta Baptist, MD;  Location: [place];  Service: ENT;  Laterality: Right;   EYE SURGERY Left 01/13/2021   FRONTAL SINUS EXPLORATION Right 11/01/2018   Procedure: FRONTAL SINUS EXPLORATION;  Surgeon: Leta Baptist, MD;  Location: [place];  Service: ENT;  Laterality: Right;   MAXILLARY ANTROSTOMY Right 11/01/2018   Procedure: RIGHT ENDOSCOPIC MAXILLARY ANTROSTOMY WITH TISSUE REMOVAL;  Surgeon: Leta Baptist, MD;  Location: [place];  Service: ENT;  Laterality: Right;   NASAL SEPTOPLASTY W/ TURBINOPLASTY N/A 11/01/2018   Procedure: NASAL SEPTOPLASTY WITH TURBINATE REDUCTION;  Surgeon: Leta Baptist,  MD;  Location: [place];  Service: ENT;  Laterality: N/A;   SINUS ENDO WITH FUSION N/A 11/01/2018   Procedure: SINUS ENDO WITH FUSION;  Surgeon: Leta Baptist, MD;  Location: [place];  Service: ENT;  Laterality: N/A;   SKIN CANCER DESTRUCTION     Back of left leg, right shoulder   SPHENOIDECTOMY Right 11/01/2018   Procedure: RIGHT SPHENOIDECTOMY WITH TISSUE REMOVAL;  Surgeon: Leta Baptist, MD;  Location: [place];  Service: ENT;  Laterality: Right;   TONSILLECTOMY     Childhood   TUBAL LIGATION     TUMOR EXCISION     Tongue   Family History  Problem Relation Age of Onset   Prostate cancer Father        metastic, 04/2006   Obesity Sister    Fibromyalgia Sister    Osteoporosis Mother    Cerebrovascular Disease Mother    Brain cancer Mother    Cerebrovascular Disease Brother    Stroke Son        x3, Vertebral artery dissection, January 2009   Hypertension Son    Social History   Socioeconomic History   Marital status: Married    Spouse name: Alvester Chou   Number of children: 1   Years of education: 12+   Highest education level: 12th grade  Occupational History   Occupation: retired   Tobacco Use   Smoking status: Never   Smokeless tobacco: Never  Vaping Use   Vaping Use: Never used  Substance and Sexual Activity   Alcohol use: No   Drug use: No   Sexual activity: Not on file  Other Topics Concern   Not on file  Social History Narrative   Married, lives with spouse   Spouse admitted for lumbar surgery         Social Determinants of Health   Financial Resource Strain: Low Risk  (03/31/2021)   Overall Financial Resource Strain (CARDIA)    Difficulty of Paying Living Expenses: Not hard at all  Food Insecurity: No Food Insecurity (03/31/2021)   Hunger Vital Sign    Worried About Running Out of Food in the Last Year: Never true    [place] in the Last Year: Never true  Transportation Needs: No Transportation Needs  (03/31/2021)   PRAPARE - Hydrologist (Medical): No    Lack of Transportation (Non-Medical): No  Physical Activity: Insufficiently Active (03/31/2021)   Exercise Vital Sign    Days of Exercise per Week: 3 days    Minutes of Exercise per Session: 30 min  Stress: No Stress Concern Present (03/31/2021)   [place]    Feeling of Stress : Not at all  Social Connections: Moderately Integrated (03/31/2021)   Social Connection and Isolation Panel [  NHANES]    Frequency of Communication with Friends and Family: More than three times a week    Frequency of Social Gatherings with Friends and Family: Once a week    Attends Religious Services: More than 4 times per year    Active Member of [employer] or Organizations: No    Attends Archivist Meetings: Never    Marital Status: Married    Tobacco Counseling Counseling given: Not Answered   Clinical Intake:                 Diabetic?no         Activities of Daily Living     No data to display          Patient Care Team: Fayrene Helper, MD as PCP - Danella Sensing, MD as Referring Physician (Unknown Physician Specialty)  Indicate any recent Medical Services you Sara have received from other than [facility] providers in the past year (date Sara be approximate).     Assessment:   This is a routine wellness examination for Sara Woods.  Hearing/Vision screen No results found.  Dietary issues and exercise activities discussed:     Goals Addressed   None    Depression Screen    02/19/2022    2:29 PM 08/20/2021    3:16 PM 06/17/2021    3:04 PM 03/31/2021    3:42 PM 02/11/2021    3:04 PM 12/10/2020    3:01 PM 05/22/2020   10:18 AM  PHQ 2/9 Scores  PHQ - 2 Score 0 0 0 0 0 0 0  PHQ- 9 Score    0 0      Fall Risk    02/19/2022    2:29 PM 08/20/2021    3:15 PM 06/17/2021    3:04 PM 03/31/2021    3:49 PM 02/11/2021     3:04 PM  [place] in the past year? 0 0 0 0 0  Number falls in past yr: 0 0 0 0   Injury with Fall? 0 0 0 0   Risk for fall due to : No Fall Risks No Fall Risks     Follow up  Falls evaluation completed  Falls prevention discussed     FALL RISK PREVENTION PERTAINING TO THE HOME:  Any stairs in or around the home? Yes  If so, are there any without handrails? No  Home free of loose throw rugs in walkways, pet beds, electrical cords, etc? Yes  Adequate lighting in your home to reduce risk of falls? Yes   ASSISTIVE DEVICES UTILIZED TO PREVENT FALLS:  Life alert? Yes  Use of a cane, walker or w/c? No  Grab bars in the bathroom? Yes  Shower chair or bench in shower? Yes  Elevated toilet seat or a handicapped toilet? No   TIMED UP AND GO:  Was the test performed? No .  Length of time to ambulate 10 feet:  sec.     Cognitive Function:        03/31/2021    3:52 PM 03/28/2020    1:16 PM 03/28/2019    1:09 PM 03/22/2018    1:23 PM 01/06/2017    3:37 PM  6CIT Screen  What Year? 0 points 0 points 0 points 0 points 0 points  What month? 0 points 0 points 0 points 0 points 0 points  What time? 0 points 0 points 0 points 0 points 0 points  Count back from 20 0 points  0 points 0 points 0 points 0 points  Months in reverse 2 points 0 points 0 points 0 points 0 points  Repeat phrase 0 points 0 points 0 points 0 points 0 points  Total Score 2 points 0 points 0 points 0 points 0 points    Immunizations Immunization History  Administered Date(s) Administered   Fluad Quad(high Dose 65+) 02/11/2021, 03/05/2022   H1N1 04/19/2008   Influenza Split 01/30/2011, 02/24/2012   Influenza Whole 02/23/2007, 02/23/2008   Influenza,inj,Quad PF,6+ Mos 02/27/2013, 04/19/2014, 02/26/2015, 03/23/2016, 01/06/2017, 12/23/2017   Influenza-Unspecified 02/28/2020   Moderna Sars-Covid-2 Vaccination 07/06/2019, 07/30/2019, 04/29/2020, 03/25/2021   Pneumococcal Conjugate-13 12/06/2013    Pneumococcal Polysaccharide-23 12/19/2010   Td 02/24/2010   Zoster, Live 12/19/2010    TDAP status: Up to date  Flu Vaccine status: Up to date  Pneumococcal vaccine status: Up to date  Covid-19 vaccine status: Completed vaccines  Qualifies for Shingles Vaccine? Yes   Zostavax completed No   Shingrix Completed?: No.    Education has been provided regarding the importance of this vaccine. Patient has been advised to call insurance company to determine out of pocket expense if they have not yet received this vaccine. Advised Sara also receive vaccine at local pharmacy or Health Dept. Verbalized acceptance and understanding.  Screening Tests Health Maintenance  Topic Date Due   Zoster Vaccines- Shingrix (1 of 2) Never done   COVID-19 Vaccine (5 - 2023-24 season) 01/09/2022   Medicare Annual Wellness (AWV)  03/31/2022   Pneumonia Vaccine 109+ Years old  Completed   INFLUENZA VACCINE  Completed   DEXA SCAN  Completed   Hepatitis C Screening  Completed   HPV VACCINES  Aged Out   COLONOSCOPY (Pts 45-28yr Insurance coverage will need to be confirmed)  Discontinued    Health Maintenance  Health Maintenance Due  Topic Date Due   Zoster Vaccines- Shingrix (1 of 2) Never done   COVID-19 Vaccine (5 - 2023-24 season) 01/09/2022   Medicare Annual Wellness (AWV)  03/31/2022    Colorectal cancer screening: No longer required.   Mammogram status: No longer required due to age.    Lung Cancer Screening: (Low Dose CT Chest recommended if Age 76-80years, 30 pack-year currently smoking OR have quit w/in 15years.) does not qualify.   Lung Cancer Screening Referral:   Additional Screening:  Hepatitis C Screening: does not qualify; Completed 03/15/15  Vision Screening: Recommended annual ophthalmology exams for early detection of glaucoma and other disorders of the eye. Is the patient up to date with their annual eye exam?  Yes  Who is the provider or what is the name of the office in  which the patient attends annual eye exams? Dr YAnthony SarIf pt is not established with a provider, would they like to be referred to a provider to establish care? No .   Dental Screening: Recommended annual dental exams for proper oral hygiene  Community Resource Referral / Chronic Care Management: CRR required this visit?  No   CCM required this visit?  No      Plan:     I have personally reviewed and noted the following in the patient's chart:   Medical and social history Use of alcohol, tobacco or illicit drugs  Current medications and supplements including opioid prescriptions. Patient is not currently taking opioid prescriptions. Functional ability and status Nutritional status Physical activity Advanced directives List of other physicians Hospitalizations, surgeries, and ER visits in previous 12 months Vitals Screenings to include cognitive,  depression, and falls Referrals and appointments  In addition, I have reviewed and discussed with patient certain preventive protocols, quality metrics, and best practice recommendations. A written personalized care plan for preventive services as well as general preventive health recommendations were provided to patient.     Jill Side, [place]   04/06/2022   Nurse Notes:

## 2022-04-06 NOTE — Patient Instructions (Signed)
  [name] , Thank you for taking time to come for your Medicare Wellness Visit. I appreciate your ongoing commitment to your health goals. Please review the following plan we discussed and let me know if I can assist you in the future.   These are the goals we discussed:  Goals      Exercise 3x per week (30 min per time)     Recommend starting a routine exercise program at least 3 days a week for 30-45 minutes at a time as tolerated.       Patient Stated     Patient states that her goal is to be ready before christmas        This is a list of the screening recommended for you and due dates:  Health Maintenance  Topic Date Due   Zoster (Shingles) Vaccine (1 of 2) Never done   COVID-19 Vaccine (6 - 2023-24 season) 05/04/2022   Medicare Annual Wellness Visit  04/07/2023   Pneumonia Vaccine  Completed   Flu Shot  Completed   DEXA scan (bone density measurement)  Completed   Hepatitis C Screening: USPSTF Recommendation to screen - Ages 6-79 yo.  Completed   HPV Vaccine  Aged Out   Colon Cancer Screening  Discontinued

## 2022-04-21 ENCOUNTER — Other Ambulatory Visit: Payer: Self-pay | Admitting: Family Medicine

## 2022-05-06 ENCOUNTER — Ambulatory Visit (HOSPITAL_COMMUNITY)
Admission: RE | Admit: 2022-05-06 | Discharge: 2022-05-06 | Disposition: A | Discharge status: Home / Self Care | Payer: Medicare Other | Source: Ambulatory Visit | Attending: Family Medicine | Admitting: Family Medicine

## 2022-05-06 DIAGNOSIS — Z1231 Encounter for screening mammogram for malignant neoplasm of breast: Secondary | ICD-10-CM | POA: Insufficient documentation

## 2022-05-14 DIAGNOSIS — E785 Hyperlipidemia, unspecified: Secondary | ICD-10-CM | POA: Diagnosis not present

## 2022-05-15 ENCOUNTER — Other Ambulatory Visit: Payer: Self-pay

## 2022-05-15 LAB — CMP14+EGFR
ALT: 15 IU/L (ref 0–32)
AST: 17 IU/L (ref 0–40)
Albumin/Globulin Ratio: 2 (ref 1.2–2.2)
Albumin: 4.3 g/dL (ref 3.8–4.8)
Alkaline Phosphatase: 95 IU/L (ref 44–121)
BUN/Creatinine Ratio: 21 (ref 12–28)
BUN: 21 mg/dL (ref 8–27)
Bilirubin Total: 0.4 mg/dL (ref 0.0–1.2)
CO2: 22 mmol/L (ref 20–29)
Calcium: 10.5 mg/dL — ABNORMAL HIGH (ref 8.7–10.3)
Chloride: 106 mmol/L (ref 96–106)
Creatinine, Ser: 1 mg/dL (ref 0.57–1.00)
Globulin, Total: 2.1 g/dL (ref 1.5–4.5)
Glucose: 92 mg/dL (ref 70–99)
Potassium: 4.4 mmol/L (ref 3.5–5.2)
Sodium: 141 mmol/L (ref 134–144)
Total Protein: 6.4 g/dL (ref 6.0–8.5)
eGFR: 58 mL/min/{1.73_m2} — ABNORMAL LOW (ref >59)

## 2022-05-15 LAB — LIPID PANEL
Chol/HDL Ratio: 4.3 ratio (ref 0.0–4.4)
Cholesterol, Total: 260 mg/dL — ABNORMAL HIGH (ref 100–199)
HDL: 60 mg/dL (ref >39)
LDL Chol Calc (NIH): 180 mg/dL — ABNORMAL HIGH (ref 0–99)
Triglycerides: 114 mg/dL (ref 0–149)
VLDL Cholesterol Cal: 20 mg/dL (ref 5–40)

## 2022-05-15 MED ORDER — ROSUVASTATIN CALCIUM 40 MG PO TABS: 40.0000 mg | ORAL_TABLET | Freq: Every day | ORAL | 1 refills | Status: DC

## 2022-06-24 ENCOUNTER — Other Ambulatory Visit: Payer: Self-pay | Admitting: Family Medicine

## 2022-07-14 ENCOUNTER — Other Ambulatory Visit: Payer: Self-pay | Admitting: Family Medicine

## 2022-07-18 ENCOUNTER — Other Ambulatory Visit: Payer: Self-pay | Admitting: Family Medicine

## 2022-08-17 ENCOUNTER — Other Ambulatory Visit: Payer: Self-pay | Admitting: Family Medicine

## 2022-08-20 ENCOUNTER — Ambulatory Visit: Payer: Medicare Other | Admitting: Family Medicine

## 2022-08-20 ENCOUNTER — Encounter: Payer: Self-pay | Admitting: Family Medicine

## 2022-08-20 ENCOUNTER — Ambulatory Visit (INDEPENDENT_AMBULATORY_CARE_PROVIDER_SITE_OTHER): Payer: Medicare Other | Admitting: Family Medicine

## 2022-08-20 VITALS — BP 119/80 | HR 93 | Ht 65.0 in | Wt 159.1 lb

## 2022-08-20 DIAGNOSIS — R195 Other fecal abnormalities: Secondary | ICD-10-CM

## 2022-08-20 DIAGNOSIS — E785 Hyperlipidemia, unspecified: Secondary | ICD-10-CM

## 2022-08-20 DIAGNOSIS — F5104 Psychophysiologic insomnia: Secondary | ICD-10-CM

## 2022-08-20 DIAGNOSIS — E559 Vitamin D deficiency, unspecified: Secondary | ICD-10-CM

## 2022-08-20 DIAGNOSIS — R42 Dizziness and giddiness: Secondary | ICD-10-CM

## 2022-08-20 DIAGNOSIS — Z1211 Encounter for screening for malignant neoplasm of colon: Secondary | ICD-10-CM | POA: Diagnosis not present

## 2022-08-20 DIAGNOSIS — I10 Essential (primary) hypertension: Secondary | ICD-10-CM | POA: Diagnosis not present

## 2022-08-20 DIAGNOSIS — G43109 Migraine with aura, not intractable, without status migrainosus: Secondary | ICD-10-CM | POA: Diagnosis not present

## 2022-08-20 NOTE — Patient Instructions (Addendum)
F/U in 6 months, call uif you need me sooner  PLEASE BRING all medication bottles to visit   Shingrix x 2 vaccine, covid vaccine, RSV and TdaP are showing as due  [facility]guard to be arranged by Nurse for stool testing for colon cancer screening, please follow through  Labs today ( has eaten bread and jelly) cBC, lipid, cmp and eGFr, tSH, Vit D  It is important that you exercise regularly at least 30 minutes 5 times a week. If you develop chest pain, have severe difficulty breathing, or feel very tired, stop exercising immediately and seek medical attention   Think about what you will eat, plan ahead. Choose " clean, green, fresh or frozen" over canned, processed or packaged foods which are more sugary, salty and fatty. 70 to 75% of food eaten should be vegetables and fruit. Three meals at set times with snacks allowed between meals, but they must be fruit or vegetables. Aim to eat over a 12 hour period , example 7 am to 7 pm, and STOP after  your last meal of the day. Drink water,generally about 64 ounces per day, no other drink is as healthy. Fruit juice is best enjoyed in a healthy way, by EATING the fruit.  Thanks for choosing [facility], we consider it a privelige to serve you.

## 2022-08-21 ENCOUNTER — Encounter: Payer: Self-pay | Admitting: Family Medicine

## 2022-08-21 LAB — CBC
Hematocrit: 44.5 % (ref 34.0–46.6)
Hemoglobin: 13.8 g/dL (ref 11.1–15.9)
MCH: 29.7 pg (ref 26.6–33.0)
MCHC: 31 g/dL — ABNORMAL LOW (ref 31.5–35.7)
MCV: 96 fL (ref 79–97)
Platelets: 352 10*3/uL (ref 150–450)
RBC: 4.65 x10E6/uL (ref 3.77–5.28)
RDW: 12.6 % (ref 11.7–15.4)
WBC: 8.2 10*3/uL (ref 3.4–10.8)

## 2022-08-21 LAB — LIPID PANEL
Chol/HDL Ratio: 2.3 ratio (ref 0.0–4.4)
Cholesterol, Total: 131 mg/dL (ref 100–199)
HDL: 58 mg/dL (ref >39)
LDL Chol Calc (NIH): 56 mg/dL (ref 0–99)
Triglycerides: 89 mg/dL (ref 0–149)
VLDL Cholesterol Cal: 17 mg/dL (ref 5–40)

## 2022-08-21 LAB — VITAMIN D 25 HYDROXY (VIT D DEFICIENCY, FRACTURES): Vit D, 25-Hydroxy: 75.2 ng/mL (ref 30.0–100.0)

## 2022-08-21 LAB — CMP14+EGFR
ALT: 32 IU/L (ref 0–32)
AST: 29 IU/L (ref 0–40)
Albumin/Globulin Ratio: 1.9 (ref 1.2–2.2)
Albumin: 4.4 g/dL (ref 3.8–4.8)
Alkaline Phosphatase: 93 IU/L (ref 44–121)
BUN/Creatinine Ratio: 21 (ref 12–28)
BUN: 19 mg/dL (ref 8–27)
Bilirubin Total: 0.4 mg/dL (ref 0.0–1.2)
CO2: 21 mmol/L (ref 20–29)
Calcium: 10.1 mg/dL (ref 8.7–10.3)
Chloride: 107 mmol/L — ABNORMAL HIGH (ref 96–106)
Creatinine, Ser: 0.91 mg/dL (ref 0.57–1.00)
Globulin, Total: 2.3 g/dL (ref 1.5–4.5)
Glucose: 84 mg/dL (ref 70–99)
Potassium: 4.1 mmol/L (ref 3.5–5.2)
Sodium: 144 mmol/L (ref 134–144)
Total Protein: 6.7 g/dL (ref 6.0–8.5)
eGFR: 65 mL/min/{1.73_m2} (ref >59)

## 2022-08-21 LAB — TSH: TSH: 1.92 u[IU]/mL (ref 0.450–4.500)

## 2022-08-24 ENCOUNTER — Encounter: Payer: Self-pay | Admitting: Family Medicine

## 2022-08-24 DIAGNOSIS — E559 Vitamin D deficiency, unspecified: Secondary | ICD-10-CM | POA: Insufficient documentation

## 2022-08-24 NOTE — Assessment & Plan Note (Signed)
Controlled, no change in medication DASH diet and commitment to daily physical activity for a minimum of 30 minutes discussed and encouraged, as a part of hypertension management. The importance of attaining a healthy weight is also discussed.     08/20/2022   10:38 AM 02/19/2022    2:29 PM 08/20/2021    3:15 PM 06/17/2021    3:47 PM 06/17/2021    3:03 PM 02/11/2021    3:41 PM 02/11/2021    3:03 PM  BP/Weight  Systolic BP 119 130 130 160 137 134 163  Diastolic BP 80 80 83 80 80 82 77  Wt. (Lbs) 159.12 161 158.12  163  157  BMI 26.48 kg/m2 26.79 kg/m2 28.01 kg/m2  28.87 kg/m2  27.81 kg/m2

## 2022-08-24 NOTE — Assessment & Plan Note (Signed)
Sleep hygiene reviewed and written information offered also. Prescription sent for  medication needed.  

## 2022-08-24 NOTE — Assessment & Plan Note (Signed)
Updated lab needed at/ before next visit.   

## 2022-08-24 NOTE — Assessment & Plan Note (Signed)
Controlled, no change in medication  

## 2022-08-24 NOTE — Assessment & Plan Note (Signed)
Currently stable , continue meclizine as needed

## 2022-08-24 NOTE — Progress Notes (Signed)
   Sara Woods     MRN: [id number]      DOB: 1945/09/01   HPI [name] is here for follow up and re-evaluation of chronic medical conditions, medication management and review of any available recent lab and radiology data.  Preventive health is updated, specifically  Cancer screening and Immunization.   Questions or concerns regarding consultations or procedures which the PT has had in the interim are  addressed. The PT denies any adverse reactions to current medications since the last visit.  There are no new concerns.  There are no specific complaints , states doing well overall,as is her husband and son NEEDS to bring ALL MEDS she is taking to next visit as there is confusion regarding this  ROS Denies recent fever or chills. Denies sinus pressure, nasal congestion, ear pain or sore throat. Denies chest congestion, productive cough or wheezing. Denies chest pains, palpitations and leg swelling Denies abdominal pain, nausea, vomiting,diarrhea or constipation.   Denies dysuria, frequency, hesitancy or incontinence. Denies joint pain, swelling and limitation in mobility. Denies headaches, seizures, numbness, or tingling. Denies depression, anxiety or insomnia. Denies skin break down or rash.   PE  BP 119/80 (BP Location: Right Arm, Patient Position: Sitting, Cuff Size: Normal)   Pulse 93   Ht 5\' 5"  (1.651 m)   Wt 159 lb 1.9 oz (72.2 kg)   SpO2 96%   BMI 26.48 kg/m   Patient alert and oriented and in no cardiopulmonary distress.  HEENT: No facial asymmetry, EOMI,     Neck supple .  Chest: Clear to auscultation bilaterally.  CVS: S1, S2 no murmurs, no S3.Regular rate.  ABD: Soft non tender.   Ext: No edema  MS: Adequate ROM spine, shoulders, hips and knees.  Skin: Intact, no ulcerations or rash noted.  Psych: Good eye contact, normal affect. Memory intact not anxious or depressed appearing.  CNS: CN 2-12 intact, power,  normal throughout.no focal deficits  noted.   Assessment & Plan  Essential hypertension Controlled, no change in medication DASH diet and commitment to daily physical activity for a minimum of 30 minutes discussed and encouraged, as a part of hypertension management. The importance of attaining a healthy weight is also discussed.     08/20/2022   10:38 AM 02/19/2022    2:29 PM 08/20/2021    3:15 PM 06/17/2021    3:47 PM 06/17/2021    3:03 PM 02/11/2021    3:41 PM 02/11/2021    3:03 PM  BP/Weight  Systolic BP 119 130 130 160 137 134 163  Diastolic BP 80 80 83 80 80 82 77  Wt. (Lbs) 159.12 161 158.12  163  157  BMI 26.48 kg/m2 26.79 kg/m2 28.01 kg/m2  28.87 kg/m2  27.81 kg/m2       Insomnia Sleep hygiene reviewed and written information offered also. Prescription sent for  medication needed.   Hyperlipidemia LDL goal <100 Hyperlipidemia:Low fat diet discussed and encouraged.   Lipid Panel  Lab Results  Component Value Date   CHOL 131 08/20/2022   HDL 58 08/20/2022   LDLCALC 56 08/20/2022   TRIG 89 08/20/2022   CHOLHDL 2.3 08/20/2022     Controlled, no change in medication   Vertigo Currently stable , continue meclizine as needed  Migraine with aura Controlled, no change in medication   Vitamin D deficiency Updated lab needed at/ before next visit.

## 2022-08-24 NOTE — Assessment & Plan Note (Signed)
Hyperlipidemia:Low fat diet discussed and encouraged.   Lipid Panel  Lab Results  Component Value Date   CHOL 131 08/20/2022   HDL 58 08/20/2022   LDLCALC 56 08/20/2022   TRIG 89 08/20/2022   CHOLHDL 2.3 08/20/2022     Controlled, no change in medication

## 2022-09-03 DIAGNOSIS — Z1211 Encounter for screening for malignant neoplasm of colon: Secondary | ICD-10-CM | POA: Diagnosis not present

## 2022-09-10 LAB — COLOGUARD: COLOGUARD: POSITIVE — AB

## 2022-09-10 NOTE — Addendum Note (Signed)
Addended by: Kerri Perches on: 09/10/2022 12:03 PM   Modules accepted: Orders

## 2022-09-15 ENCOUNTER — Other Ambulatory Visit: Payer: Self-pay | Admitting: Family Medicine

## 2022-09-21 ENCOUNTER — Encounter: Payer: Self-pay | Admitting: Gastroenterology

## 2022-09-21 ENCOUNTER — Telehealth: Payer: Self-pay | Admitting: *Deleted

## 2022-09-21 ENCOUNTER — Ambulatory Visit (INDEPENDENT_AMBULATORY_CARE_PROVIDER_SITE_OTHER): Payer: Medicare Other | Admitting: Gastroenterology

## 2022-09-21 VITALS — BP 152/73 | HR 76 | Temp 97.9°F | Ht 65.5 in | Wt 160.4 lb

## 2022-09-21 DIAGNOSIS — R195 Other fecal abnormalities: Secondary | ICD-10-CM | POA: Diagnosis not present

## 2022-09-21 NOTE — Telephone Encounter (Signed)
LMTCB to call back to schedule TCS with Dr. Marletta Lor, ASA 2

## 2022-09-21 NOTE — Patient Instructions (Addendum)
We will schedule you for a colonoscopy in the near future with Dr. Marletta Lor.  You will receive separate detailed written instructions regarding your prep.  Please do not hesitate to reach out if you have any questions or concerns.  It was a pleasure to see you today. I want to create trusting relationships with patients. If you receive a survey regarding your visit,  I greatly appreciate you taking time to fill this out on paper or through your MyChart. I value your feedback.  Brooke Bonito, MSN, FNP-BC, AGACNP-BC [facility]

## 2022-09-21 NOTE — Progress Notes (Signed)
GI Office Note    Referring Provider: Kerri Perches, MD Primary Care Physician:  Kerri Perches, MD  Primary Gastroenterologist: Hennie Duos. Marletta Lor, DO  Chief Complaint   Chief Complaint  Patient presents with   positive cologuard    Pt says done the test  about 2 weeks ago.   History of Present Illness   Sara Woods is a 77 y.o. female presenting today at the request of Kerri Perches, MD for positive Cologuard.  Last colonoscopy in March 2012: -Left-sided diverticulosis -Exam normal otherwise  Labs 08/20/2022: Hemoglobin 13.8, normal CMP.  Normal lipid panel. TSH 1.92.  Vitamin D 75.2  Positive Cologuard 09/03/2022.  Today: No melena or brbpr. Eats prunes and oatmeal and typically does not have a problem with constipation. Lately has been having BM twice per day. Reports her appetite is too good. Denies unintentional weight loss (trying to get her husband to gain weight). No abdominal pain, N/V, change in bowel movements.   Denies acid reflux or dysphagia.   Denies family history of colon cancer or colon polyps.   Does take Voltaren 50 mg BID.   Current Outpatient Medications  Medication Sig Dispense Refill   amLODipine (NORVASC) 5 MG tablet TAKE ONE TABLET BY MOUTH DAILY 30 tablet 2   Calcium-Vitamin D (CALTRATE 600 PLUS-VIT D PO) Take 2 tablets by mouth daily.     diclofenac (VOLTAREN) 50 MG EC tablet TAKE ONE TABLET BY MOUTH TWICE DAILY 60 tablet 4   doxepin (SINEQUAN) 10 MG capsule TAKE TWO (2) CAPSULES BY MOUTH AT BEDTIME 60 capsule 2   fluticasone (FLONASE) 50 MCG/ACT nasal spray place TWO SPRAYS into BOTH nostrils DAILY 16 g 6   gabapentin (NEURONTIN) 300 MG capsule TAKE ONE CAPSULE BY MOUTH TWICE DAILY AS NEEDED FOR PAIN 60 capsule 1   KRILL OIL 1000 MG CAPS Take 1 capsule by mouth daily.     lisinopril (ZESTRIL) 40 MG tablet TAKE ONE TABLET BY MOUTH DAILY 120 tablet 1   meclizine (ANTIVERT) 25 MG tablet TAKE ONE TABLET BY MOUTH THREE TIMES  DAILY AS NEEDED FOR DIZZINESS 30 tablet 3   multivitamin (THERAGRAN) per tablet Take 1 tablet by mouth daily.     rosuvastatin (CRESTOR) 40 MG tablet Take 1 tablet (40 mg total) by mouth daily. 90 tablet 1   topiramate (TOPAMAX) 50 MG tablet TAKE ONE TABLET BY MOUTH TWICE DAILY 60 tablet 1   No current facility-administered medications for this visit.    Past Medical History:  Diagnosis Date   Allergic rhinitis    Anxiety    Arthritis    Depression approx 1983   History of migraines    Hyperlipidemia approx 2006   Hypertension approx 2000   MRSA infection    Recurrent abcesses to legs about 8 years ago   Recurrent miscarriages due to luteal phase defect, not pregnant    5 miscarriages with 1 living adult son    Past Surgical History:  Procedure Laterality Date   BREAST EXCISIONAL BIOPSY Left    ETHMOIDECTOMY Right 11/01/2018   Procedure: RIGHT TOTAL ETHMOIDECTOMY;  Surgeon: Newman Pies, MD;  Location: [facility];  Service: ENT;  Laterality: Right;   EYE SURGERY Left 01/13/2021   FRONTAL SINUS EXPLORATION Right 11/01/2018   Procedure: FRONTAL SINUS EXPLORATION;  Surgeon: Newman Pies, MD;  Location: [facility];  Service: ENT;  Laterality: Right;   MAXILLARY ANTROSTOMY Right 11/01/2018   Procedure: RIGHT ENDOSCOPIC MAXILLARY  ANTROSTOMY WITH TISSUE REMOVAL;  Surgeon: Newman Pies, MD;  Location: [facility];  Service: ENT;  Laterality: Right;   NASAL SEPTOPLASTY W/ TURBINOPLASTY N/A 11/01/2018   Procedure: NASAL SEPTOPLASTY WITH TURBINATE REDUCTION;  Surgeon: Newman Pies, MD;  Location: [facility];  Service: ENT;  Laterality: N/A;   SINUS ENDO WITH FUSION N/A 11/01/2018   Procedure: SINUS ENDO WITH FUSION;  Surgeon: Newman Pies, MD;  Location: [facility];  Service: ENT;  Laterality: N/A;   SKIN CANCER DESTRUCTION     Back of left leg, right shoulder   SPHENOIDECTOMY Right 11/01/2018   Procedure: RIGHT SPHENOIDECTOMY WITH  TISSUE REMOVAL;  Surgeon: Newman Pies, MD;  Location:  SURGERY CENTER;  Service: ENT;  Laterality: Right;   TONSILLECTOMY     Childhood   TUBAL LIGATION     TUMOR EXCISION     Tongue    Family History  Problem Relation Age of Onset   Prostate cancer Father        metastic, 04/2006   Obesity Sister    Fibromyalgia Sister    Osteoporosis Mother    Cerebrovascular Disease Mother    Brain cancer Mother    Cerebrovascular Disease Brother    Stroke Son        x3, Vertebral artery dissection, January 2009   Hypertension Son     Allergies as of 09/21/2022 - Review Complete 09/21/2022  Allergen Reaction Noted   Adhesive [tape] Itching and Other (See Comments) 02/29/2012   Penicillins Rash    Sulfonamide derivatives Rash     Social History   Socioeconomic History   Marital status: Married    Spouse name: Gery Pray   Number of children: 1   Years of education: 12+   Highest education level: 12th grade  Occupational History   Occupation: retired   Tobacco Use   Smoking status: Never   Smokeless tobacco: Never  Vaping Use   Vaping Use: Never used  Substance and Sexual Activity   Alcohol use: No   Drug use: No   Sexual activity: Not on file  Other Topics Concern   Not on file  Social History Narrative   Married, lives with spouse   Spouse admitted for lumbar surgery         Social Determinants of Health   Financial Resource Strain: Low Risk  (04/06/2022)   Overall Financial Resource Strain (CARDIA)    Difficulty of Paying Living Expenses: Not hard at all  Food Insecurity: No Food Insecurity (04/06/2022)   Hunger Vital Sign    Worried About Running Out of Food in the Last Year: Never true    Ran Out of Food in the Last Year: Never true  Transportation Needs: No Transportation Needs (04/06/2022)   PRAPARE - Administrator, Civil Service (Medical): No    Lack of Transportation (Non-Medical): No  Physical Activity: Insufficiently Active (03/31/2021)    Exercise Vital Sign    Days of Exercise per Week: 3 days    Minutes of Exercise per Session: 30 min  Stress: No Stress Concern Present (04/06/2022)   [employer] of Occupational Health - Occupational Stress Questionnaire    Feeling of Stress : Not at all  Social Connections: Moderately Isolated (04/06/2022)   Social Connection and Isolation Panel [NHANES]    Frequency of Communication with Friends and Family: Twice a week    Frequency of Social Gatherings with Friends and Family: Never    Attends Religious  Services: More than 4 times per year    Active Member of Clubs or Organizations: No    Attends Banker Meetings: Never    Marital Status: Married  Catering manager Violence: Not At Risk (03/31/2021)   Humiliation, Afraid, Rape, and Kick questionnaire    Fear of Current or Ex-Partner: No    Emotionally Abused: No    Physically Abused: No    Sexually Abused: No     Review of Systems   Gen: Denies any fever, chills, fatigue, weight loss, lack of appetite.  CV: Denies chest pain, heart palpitations, peripheral edema, syncope.  Resp: Denies shortness of breath at rest or with exertion. Denies wheezing or cough.  GI: see HPI GU : Denies urinary burning, urinary frequency, urinary hesitancy MS: Denies joint pain, muscle weakness, cramps, or limitation of movement.  Derm: Denies rash, itching, dry skin Psych: Denies depression, anxiety, memory loss, and confusion Heme: Denies bruising, bleeding, and enlarged lymph nodes.  Physical Exam   BP (!) 152/73 (BP Location: Right Arm, Patient Position: Sitting, Cuff Size: Normal)   Pulse 76   Temp 97.9 F (36.6 C) (Rectal)   Ht 5' 5.5" (1.664 m)   Wt 160 lb 6.4 oz (72.8 kg)   SpO2 (!) 76%   BMI 26.29 kg/m   General:   Alert and oriented. Pleasant and cooperative. Well-nourished and well-developed.  Head:  Normocephalic and atraumatic. Eyes:  Without icterus, sclera clear and conjunctiva pink.  Ears:  Normal  auditory acuity. Mouth:  No deformity or lesions, oral mucosa pink.  Lungs:  Clear to auscultation bilaterally. No wheezes, rales, or rhonchi. No distress.  Heart:  S1, S2 present without murmurs appreciated.  Abdomen:  +BS, soft, non-tender and non-distended. No HSM noted. No guarding or rebound. No masses appreciated.  Rectal:  Deferred  Msk:  Symmetrical without gross deformities. Normal posture. Extremities:  Without edema. Neurologic:  Alert and  oriented x4;  grossly normal neurologically. Skin:  Intact without significant lesions or rashes. Psych:  Alert and cooperative. Normal mood and affect.   Assessment   Sara Woods is a 77 y.o. female with a history of anxiety, depression, migraines, HLD, HTN, arthritis presenting today for evaluation of positive Cologuard.  Positive Cologuard: Last colonoscopy in 2012 with left-sided diverticulosis, otherwise normal.  No alarm symptoms present.  Denies any upper or lower GI symptoms.  PLAN   Proceed with colonoscopy with propofol by Dr. Marletta Lor in near future: the risks, benefits, and alternatives have been discussed with the patient in detail. The patient states understanding and desires to proceed. ASA 2   Brooke Bonito, MSN, FNP-BC, AGACNP-BC [facility]

## 2022-09-22 ENCOUNTER — Encounter: Payer: Self-pay | Admitting: *Deleted

## 2022-09-22 ENCOUNTER — Other Ambulatory Visit: Payer: Self-pay | Admitting: *Deleted

## 2022-09-22 MED ORDER — PEG 3350-KCL-NA BICARB-NACL 420 G PO SOLR: 4000.0000 mL | Freq: Once | ORAL | 0 refills | Status: AC

## 2022-09-22 NOTE — Telephone Encounter (Signed)
Pt has been scheduled for 10/27/22. Instructions mailed and prep sent to the pharmacy.

## 2022-10-14 ENCOUNTER — Other Ambulatory Visit: Payer: Self-pay | Admitting: Family Medicine

## 2022-10-27 ENCOUNTER — Ambulatory Visit (HOSPITAL_COMMUNITY): Payer: Medicare Other | Admitting: Anesthesiology

## 2022-10-27 ENCOUNTER — Ambulatory Visit (HOSPITAL_BASED_OUTPATIENT_CLINIC_OR_DEPARTMENT_OTHER): Payer: Medicare Other | Admitting: Anesthesiology

## 2022-10-27 ENCOUNTER — Other Ambulatory Visit: Payer: Self-pay

## 2022-10-27 ENCOUNTER — Encounter (HOSPITAL_COMMUNITY)
Admission: RE | Disposition: A | Discharge status: Home / Self Care | Payer: Self-pay | Source: Home / Self Care | Attending: Internal Medicine

## 2022-10-27 ENCOUNTER — Encounter (HOSPITAL_COMMUNITY): Payer: Self-pay

## 2022-10-27 ENCOUNTER — Ambulatory Visit (HOSPITAL_COMMUNITY)
Admission: RE | Admit: 2022-10-27 | Discharge: 2022-10-27 | Disposition: A | Discharge status: Home / Self Care | Payer: Medicare Other | Attending: Internal Medicine | Admitting: Internal Medicine

## 2022-10-27 DIAGNOSIS — Z1211 Encounter for screening for malignant neoplasm of colon: Secondary | ICD-10-CM | POA: Diagnosis not present

## 2022-10-27 DIAGNOSIS — F418 Other specified anxiety disorders: Secondary | ICD-10-CM

## 2022-10-27 DIAGNOSIS — Z8673 Personal history of transient ischemic attack (TIA), and cerebral infarction without residual deficits: Secondary | ICD-10-CM | POA: Diagnosis not present

## 2022-10-27 DIAGNOSIS — K5730 Diverticulosis of large intestine without perforation or abscess without bleeding: Secondary | ICD-10-CM | POA: Insufficient documentation

## 2022-10-27 DIAGNOSIS — K648 Other hemorrhoids: Secondary | ICD-10-CM | POA: Insufficient documentation

## 2022-10-27 DIAGNOSIS — M1990 Unspecified osteoarthritis, unspecified site: Secondary | ICD-10-CM | POA: Insufficient documentation

## 2022-10-27 DIAGNOSIS — R195 Other fecal abnormalities: Secondary | ICD-10-CM | POA: Diagnosis present

## 2022-10-27 DIAGNOSIS — I10 Essential (primary) hypertension: Secondary | ICD-10-CM | POA: Insufficient documentation

## 2022-10-27 DIAGNOSIS — Z79899 Other long term (current) drug therapy: Secondary | ICD-10-CM | POA: Insufficient documentation

## 2022-10-27 DIAGNOSIS — G43909 Migraine, unspecified, not intractable, without status migrainosus: Secondary | ICD-10-CM | POA: Insufficient documentation

## 2022-10-27 HISTORY — PX: COLONOSCOPY WITH PROPOFOL: SHX5780

## 2022-10-27 SURGERY — COLONOSCOPY WITH PROPOFOL
Anesthesia: General

## 2022-10-27 MED ORDER — LACTATED RINGERS IV SOLN
INTRAVENOUS | Status: DC
  Administered 2022-10-27: 1000 mL via INTRAVENOUS

## 2022-10-27 MED ORDER — STERILE WATER FOR IRRIGATION IR SOLN
Status: DC | PRN
  Administered 2022-10-27: 60 mL

## 2022-10-27 MED ORDER — PROPOFOL 500 MG/50ML IV EMUL
INTRAVENOUS | Status: DC | PRN
  Administered 2022-10-27: 125 ug/kg/min via INTRAVENOUS

## 2022-10-27 MED ORDER — PROPOFOL 10 MG/ML IV BOLUS
INTRAVENOUS | Status: DC | PRN
  Administered 2022-10-27: 80 mg via INTRAVENOUS

## 2022-10-27 NOTE — Discharge Instructions (Addendum)
  Colonoscopy Discharge Instructions  Read the instructions outlined below and refer to this sheet in the next few weeks. These discharge instructions provide you with general information on caring for yourself after you leave the hospital. Your doctor may also give you specific instructions. While your treatment has been planned according to the most current medical practices available, unavoidable complications occasionally occur.   ACTIVITY You may resume your regular activity, but move at a slower pace for the next 24 hours.  Take frequent rest periods for the next 24 hours.  Walking will help get rid of the air and reduce the bloated feeling in your belly (abdomen).  No driving for 24 hours (because of the medicine (anesthesia) used during the test).   Do not sign any important legal documents or operate any machinery for 24 hours (because of the anesthesia used during the test).  NUTRITION Drink plenty of fluids.  You may resume your normal diet as instructed by your doctor.  Begin with a light meal and progress to your normal diet. Heavy or fried foods are harder to digest and may make you feel sick to your stomach (nauseated).  Avoid alcoholic beverages for 24 hours or as instructed.  MEDICATIONS You may resume your normal medications unless your doctor tells you otherwise.  WHAT YOU CAN EXPECT TODAY Some feelings of bloating in the abdomen.  Passage of more gas than usual.  Spotting of blood in your stool or on the toilet paper.  IF YOU HAD POLYPS REMOVED DURING THE COLONOSCOPY: No aspirin products for 7 days or as instructed.  No alcohol for 7 days or as instructed.  Eat a soft diet for the next 24 hours.  FINDING OUT THE RESULTS OF YOUR TEST Not all test results are available during your visit. If your test results are not back during the visit, make an appointment with your caregiver to find out the results. Do not assume everything is normal if you have not heard from your  caregiver or the medical facility. It is important for you to follow up on all of your test results.  SEEK IMMEDIATE MEDICAL ATTENTION IF: You have more than a spotting of blood in your stool.  Your belly is swollen (abdominal distention).  You are nauseated or vomiting.  You have a temperature over 101.  You have abdominal pain or discomfort that is severe or gets worse throughout the day.   Your colonoscopy was relatively unremarkable.  I did not find any polyps or evidence of colon cancer.  Given your age, I do not think we need to perform further colonoscopies for colon cancer screening.  You do have diverticulosis and internal hemorrhoids. I would recommend increasing fiber in your diet or adding OTC Benefiber/Metamucil. Be sure to drink at least 4 to 6 glasses of water daily. Follow-up with GI as needed.   I hope you have a great rest of your week!  Hennie Duos. Marletta Lor, D.O. Gastroenterology and Hepatology [facility]

## 2022-10-27 NOTE — Op Note (Signed)
[facility] Patient Name: Sara Woods Procedure Date: 10/27/2022 11:30 AM MRN: [id number] Date of Birth: October 23, 1945 Attending MD: Hennie Duos. Marletta Lor , [place], [id number] CSN: [id number] Age: 77 Admit Type: Outpatient Procedure:                Colonoscopy Indications:              Screening for colorectal malignant neoplasm,                            Incidental - Positive Cologuard test Providers:                Hennie Duos. Marletta Lor, DO, Angelica Ran, Pandora Leiter,                            Technician Referring MD:              Medicines:                See the Anesthesia note for documentation of the                            administered medications Complications:            No immediate complications. Estimated Blood Loss:     Estimated blood loss: none. Procedure:                Pre-Anesthesia Assessment:                           - The anesthesia plan was to use monitored                            anesthesia care (MAC).                           After obtaining informed consent, the colonoscope                            was passed under direct vision. Throughout the                            procedure, the patient's blood pressure, pulse, and                            oxygen saturations were monitored continuously. The                            [id number]-HQ190L ([id number]) scope was introduced through                            the anus and advanced to the the cecum, identified                            by appendiceal orifice and ileocecal valve. The                            colonoscopy was performed without difficulty. The  patient tolerated the procedure well. The quality                            of the bowel preparation was evaluated using the                            BBPS [facility] Bowel Preparation Scale) with scores                            of: Right Colon = 2 (minor amount of residual                            staining, small fragments of  stool and/or opaque                            liquid, but mucosa seen well), Transverse Colon = 3                            (entire mucosa seen well with no residual staining,                            small fragments of stool or opaque liquid) and Left                            Colon = 3 (entire mucosa seen well with no residual                            staining, small fragments of stool or opaque                            liquid). The total BBPS score equals 8. The quality                            of the bowel preparation was good. Scope In: 11:36:47 AM Scope Out: 11:57:55 AM Scope Withdrawal Time: 0 hours 11 minutes 8 seconds  Total Procedure Duration: 0 hours 21 minutes 8 seconds  Findings:      Hemorrhoids were found on perianal exam.      Non-bleeding internal hemorrhoids were found during endoscopy.      Multiple medium-mouthed and small-mouthed diverticula were found in the       sigmoid colon.      The exam was otherwise without abnormality. Impression:               - Hemorrhoids found on perianal exam.                           - Non-bleeding internal hemorrhoids.                           - Diverticulosis in the sigmoid colon.                           - The examination was otherwise normal.                           -  No specimens collected. Moderate Sedation:      Per Anesthesia Care Recommendation:           - Patient has a contact number available for                            emergencies. The signs and symptoms of potential                            delayed complications were discussed with the                            patient. Return to normal activities tomorrow.                            Written discharge instructions were provided to the                            patient.                           - Resume previous diet.                           - Continue present medications.                           - No repeat colonoscopy due to age.                            - Return to GI clinic PRN. Procedure Code(s):        --- Professional ---                           O1308, Colorectal cancer screening; colonoscopy on                            individual not meeting criteria for high risk Diagnosis Code(s):        --- Professional ---                           Z12.11, Encounter for screening for malignant                            neoplasm of colon                           K64.8, Other hemorrhoids                           K57.30, Diverticulosis of large intestine without                            perforation or abscess without bleeding CPT copyright 2022 American Medical Association. All rights reserved. The codes documented in this report are preliminary and upon coder review may  be revised to meet current compliance requirements. Hennie Duos. Marletta Lor, DO Leonette Most  Merrilyn Puma, DO 10/27/2022 12:00:43 PM This report has been signed electronically. Number of Addenda: 0

## 2022-10-27 NOTE — H&P (Signed)
Primary Care Physician:  Kerri Perches, MD Primary Gastroenterologist:  Dr. Marletta Lor  Pre-Procedure History & Physical: HPI:  Sara Woods is a 77 y.o. female is here for a colonoscopy for colon cancer screening purposes/positive Cologuard testing.   Past Medical History:  Diagnosis Date   Allergic rhinitis    Anxiety    Arthritis    Depression approx 1983   History of migraines    Hyperlipidemia approx 2006   Hypertension approx 2000   MRSA infection    Recurrent abcesses to legs about 8 years ago   Recurrent miscarriages due to luteal phase defect, not pregnant    5 miscarriages with 1 living adult son    Past Surgical History:  Procedure Laterality Date   BREAST EXCISIONAL BIOPSY Left    ETHMOIDECTOMY Right 11/01/2018   Procedure: RIGHT TOTAL ETHMOIDECTOMY;  Surgeon: Newman Pies, MD;  Location: [facility];  Service: ENT;  Laterality: Right;   EYE SURGERY Left 01/13/2021   FRONTAL SINUS EXPLORATION Right 11/01/2018   Procedure: FRONTAL SINUS EXPLORATION;  Surgeon: Newman Pies, MD;  Location: [facility];  Service: ENT;  Laterality: Right;   MAXILLARY ANTROSTOMY Right 11/01/2018   Procedure: RIGHT ENDOSCOPIC MAXILLARY ANTROSTOMY WITH TISSUE REMOVAL;  Surgeon: Newman Pies, MD;  Location: [facility];  Service: ENT;  Laterality: Right;   NASAL SEPTOPLASTY W/ TURBINOPLASTY N/A 11/01/2018   Procedure: NASAL SEPTOPLASTY WITH TURBINATE REDUCTION;  Surgeon: Newman Pies, MD;  Location: [facility];  Service: ENT;  Laterality: N/A;   SINUS ENDO WITH FUSION N/A 11/01/2018   Procedure: SINUS ENDO WITH FUSION;  Surgeon: Newman Pies, MD;  Location: [facility];  Service: ENT;  Laterality: N/A;   SKIN CANCER DESTRUCTION     Back of left leg, right shoulder   SPHENOIDECTOMY Right 11/01/2018   Procedure: RIGHT SPHENOIDECTOMY WITH TISSUE REMOVAL;  Surgeon: Newman Pies, MD;  Location:  SURGERY CENTER;  Service: ENT;   Laterality: Right;   TONSILLECTOMY     Childhood   TUBAL LIGATION     TUMOR EXCISION     Tongue    Prior to Admission medications   Medication Sig Start Date End Date Taking? Authorizing Provider  amLODipine (NORVASC) 5 MG tablet TAKE ONE TABLET BY MOUTH DAILY 07/20/22  Yes Kerri Perches, MD  Calcium-Vitamin D (CALTRATE 600 PLUS-VIT D PO) Take 2 tablets by mouth daily.   Yes [provider]  diclofenac (VOLTAREN) 50 MG EC tablet TAKE ONE TABLET BY MOUTH TWICE DAILY 09/15/22  Yes Kerri Perches, MD  doxepin (SINEQUAN) 10 MG capsule TAKE TWO (2) CAPSULES BY MOUTH AT BEDTIME 09/15/22  Yes Kerri Perches, MD  fluticasone [facility]) 50 MCG/ACT nasal spray place TWO SPRAYS into BOTH nostrils DAILY 07/24/21  Yes Kerri Perches, MD  gabapentin (NEURONTIN) 300 MG capsule TAKE ONE CAPSULE BY MOUTH TWICE DAILY AS NEEDED FOR PAIN 08/17/22  Yes Kerri Perches, MD  KRILL OIL 1000 MG CAPS Take 1 capsule by mouth daily.   Yes [provider]  lisinopril (ZESTRIL) 40 MG tablet TAKE ONE TABLET BY MOUTH DAILY 06/25/22  Yes Kerri Perches, MD  meclizine (ANTIVERT) 25 MG tablet TAKE ONE TABLET BY MOUTH THREE TIMES DAILY AS NEEDED FOR DIZZINESS 07/14/22  Yes Kerri Perches, MD  multivitamin [facility]) per tablet Take 1 tablet by mouth daily.   Yes [provider]  rosuvastatin (CRESTOR) 40 MG tablet TAKE ONE TABLET BY MOUTH  DAILY 10/14/22  Yes Kerri Perches, MD  topiramate (TOPAMAX) 50 MG tablet TAKE ONE TABLET BY MOUTH TWICE DAILY 08/17/22  Yes Kerri Perches, MD    Allergies as of 09/22/2022 - Review Complete 09/21/2022  Allergen Reaction Noted   Adhesive [tape] Itching and Other (See Comments) 02/29/2012   Penicillins Rash    Sulfonamide derivatives Rash     Family History  Problem Relation Age of Onset   Prostate cancer Father        metastic, 04/2006   Obesity Sister    Fibromyalgia Sister    Osteoporosis Mother    Cerebrovascular  Disease Mother    Brain cancer Mother    Cerebrovascular Disease Brother    Stroke Son        x3, Vertebral artery dissection, January 2009   Hypertension Son     Social History   Socioeconomic History   Marital status: Married    Spouse name: Gery Pray   Number of children: 1   Years of education: 12+   Highest education level: 12th grade  Occupational History   Occupation: retired   Tobacco Use   Smoking status: Never   Smokeless tobacco: Never  Vaping Use   Vaping Use: Never used  Substance and Sexual Activity   Alcohol use: No   Drug use: No   Sexual activity: Not on file  Other Topics Concern   Not on file  Social History Narrative   Married, lives with spouse   Spouse admitted for lumbar surgery         Social Determinants of Health   Financial Resource Strain: Low Risk  (04/06/2022)   Overall Financial Resource Strain (CARDIA)    Difficulty of Paying Living Expenses: Not hard at all  Food Insecurity: No Food Insecurity (04/06/2022)   Hunger Vital Sign    Worried About Running Out of Food in the Last Year: Never true    Ran Out of Food in the Last Year: Never true  Transportation Needs: No Transportation Needs (04/06/2022)   PRAPARE - Administrator, Civil Service (Medical): No    Lack of Transportation (Non-Medical): No  Physical Activity: Insufficiently Active (03/31/2021)   Exercise Vital Sign    Days of Exercise per Week: 3 days    Minutes of Exercise per Session: 30 min  Stress: No Stress Concern Present (04/06/2022)   [employer] of Occupational Health - Occupational Stress Questionnaire    Feeling of Stress : Not at all  Social Connections: Moderately Isolated (04/06/2022)   Social Connection and Isolation Panel [NHANES]    Frequency of Communication with Friends and Family: Twice a week    Frequency of Social Gatherings with Friends and Family: Never    Attends Religious Services: More than 4 times per year    Active Member of  [employer] or Organizations: No    Attends Banker Meetings: Never    Marital Status: Married  Catering manager Violence: Not At Risk (03/31/2021)   Humiliation, Afraid, Rape, and Kick questionnaire    Fear of Current or Ex-Partner: No    Emotionally Abused: No    Physically Abused: No    Sexually Abused: No    Review of Systems: See HPI, otherwise negative ROS  Physical Exam: Vital signs in last 24 hours: Pulse Rate:  [94] 94 (06/18 1022) Resp:  [18] 18 (06/18 1022) BP: (170)/(80) 170/80 (06/18 1022) SpO2:  [98 %] 98 % (06/18 1022) Weight:  [72.3 kg] 72.3  kg (06/18 1022)   General:   Alert,  Well-developed, well-nourished, pleasant and cooperative in NAD Head:  Normocephalic and atraumatic. Eyes:  Sclera clear, no icterus.   Conjunctiva pink. Ears:  Normal auditory acuity. Nose:  No deformity, discharge,  or lesions. Msk:  Symmetrical without gross deformities. Normal posture. Extremities:  Without clubbing or edema. Neurologic:  Alert and  oriented x4;  grossly normal neurologically. Skin:  Intact without significant lesions or rashes. Psych:  Alert and cooperative. Normal mood and affect.  Impression/Plan: Sara Woods is here for a colonoscopy to be performed for colon cancer screening purposes/positive Cologuard testing.   The risks of the procedure including infection, bleed, or perforation as well as benefits, limitations, alternatives and imponderables have been reviewed with the patient. Questions have been answered. All parties agreeable.

## 2022-10-27 NOTE — Transfer of Care (Signed)
Immediate Anesthesia Transfer of Care Note  Patient: Sara Woods  Procedure(s) Performed: COLONOSCOPY WITH PROPOFOL  Patient Location: Endoscopy Unit  Anesthesia Type:General  Level of Consciousness: awake  Airway & Oxygen Therapy: Patient Spontanous Breathing  Post-op Assessment: Report given to RN and Post -op Vital signs reviewed and stable  Post vital signs: Reviewed and stable  Last Vitals:  Vitals Value Taken Time  BP 113/88 10/27/22 1200  Temp 36.7 C 10/27/22 1200  Pulse 73 10/27/22 1200  Resp 20 10/27/22 1200  SpO2 95 % 10/27/22 1200    Last Pain:  Vitals:   10/27/22 1200  TempSrc: Oral  PainSc: 0-No pain      Patients Stated Pain Goal: 5 (10/27/22 1022)  Complications: No notable events documented.

## 2022-10-27 NOTE — Anesthesia Preprocedure Evaluation (Addendum)
Anesthesia Evaluation  Patient identified by MRN, date of birth, ID band Patient awake    Reviewed: Allergy & Precautions, H&P , NPO status , Patient's Chart, lab work & pertinent test results  Airway Mallampati: III  TM Distance: >3 FB Neck ROM: Full    Dental no notable dental hx. (+) Dental Advisory Given, Teeth Intact   Pulmonary neg pulmonary ROS   Pulmonary exam normal breath sounds clear to auscultation       Cardiovascular hypertension, Pt. on medications Normal cardiovascular exam Rhythm:Regular Rate:Normal     Neuro/Psych  Headaches PSYCHIATRIC DISORDERS Anxiety Depression    CVA (mild vision problems), No Residual Symptoms    GI/Hepatic negative GI ROS, Neg liver ROS,,,  Endo/Other  negative endocrine ROS    Renal/GU negative Renal ROS  negative genitourinary   Musculoskeletal  (+) Arthritis , Osteoarthritis,    Abdominal   Peds negative pediatric ROS (+)  Hematology negative hematology ROS (+)   Anesthesia Other Findings   Reproductive/Obstetrics negative OB ROS                             Anesthesia Physical Anesthesia Plan  ASA: 3  Anesthesia Plan: General   Post-op Pain Management: Minimal or no pain anticipated   Induction: Intravenous  PONV Risk Score and Plan: 1 and Propofol infusion  Airway Management Planned: Nasal Cannula and Natural Airway  Additional Equipment:   Intra-op Plan:   Post-operative Plan:   Informed Consent: I have reviewed the patients History and Physical, chart, labs and discussed the procedure including the risks, benefits and alternatives for the proposed anesthesia with the patient or authorized representative who has indicated his/her understanding and acceptance.     Dental advisory given  Plan Discussed with: CRNA and Surgeon  Anesthesia Plan Comments:        Anesthesia Quick Evaluation

## 2022-10-27 NOTE — Anesthesia Postprocedure Evaluation (Signed)
Anesthesia Post Note  Patient: Sara Woods  Procedure(s) Performed: COLONOSCOPY WITH PROPOFOL  Patient location during evaluation: Phase II Anesthesia Type: General Level of consciousness: awake and alert and oriented Pain management: pain level controlled Vital Signs Assessment: post-procedure vital signs reviewed and stable Respiratory status: spontaneous breathing, nonlabored ventilation and respiratory function stable Cardiovascular status: blood pressure returned to baseline and stable Postop Assessment: no apparent nausea or vomiting Anesthetic complications: no  No notable events documented.   Last Vitals:  Vitals:   10/27/22 1022 10/27/22 1200  BP: (!) 170/80 113/88  Pulse: 94 73  Resp: 18 20  Temp:  36.7 C  SpO2: 98% 95%    Last Pain:  Vitals:   10/27/22 1200  TempSrc: Oral  PainSc: 0-No pain                 [name] C Teshara Moree

## 2022-11-02 ENCOUNTER — Encounter (HOSPITAL_COMMUNITY): Payer: Self-pay | Admitting: Internal Medicine

## 2022-11-12 ENCOUNTER — Other Ambulatory Visit: Payer: Self-pay | Admitting: Family Medicine

## 2022-11-13 ENCOUNTER — Other Ambulatory Visit: Payer: Self-pay | Admitting: Family Medicine

## 2023-01-11 ENCOUNTER — Other Ambulatory Visit: Payer: Self-pay | Admitting: Family Medicine

## 2023-01-20 DIAGNOSIS — Z23 Encounter for immunization: Secondary | ICD-10-CM | POA: Diagnosis not present

## 2023-02-09 ENCOUNTER — Other Ambulatory Visit: Payer: Self-pay | Admitting: Family Medicine

## 2023-02-19 ENCOUNTER — Ambulatory Visit: Payer: Medicare Other | Admitting: Family Medicine

## 2023-03-12 ENCOUNTER — Other Ambulatory Visit: Payer: Self-pay | Admitting: Family Medicine

## 2023-03-23 ENCOUNTER — Ambulatory Visit (INDEPENDENT_AMBULATORY_CARE_PROVIDER_SITE_OTHER): Payer: Medicare Other | Admitting: Family Medicine

## 2023-03-23 ENCOUNTER — Encounter: Payer: Self-pay | Admitting: Family Medicine

## 2023-03-23 VITALS — BP 134/82 | HR 99 | Ht 65.0 in | Wt 159.1 lb

## 2023-03-23 DIAGNOSIS — M8580 Other specified disorders of bone density and structure, unspecified site: Secondary | ICD-10-CM | POA: Diagnosis not present

## 2023-03-23 DIAGNOSIS — Z780 Asymptomatic menopausal state: Secondary | ICD-10-CM | POA: Diagnosis not present

## 2023-03-23 DIAGNOSIS — I10 Essential (primary) hypertension: Secondary | ICD-10-CM

## 2023-03-23 DIAGNOSIS — F5104 Psychophysiologic insomnia: Secondary | ICD-10-CM | POA: Diagnosis not present

## 2023-03-23 DIAGNOSIS — E785 Hyperlipidemia, unspecified: Secondary | ICD-10-CM | POA: Diagnosis not present

## 2023-03-23 DIAGNOSIS — G43109 Migraine with aura, not intractable, without status migrainosus: Secondary | ICD-10-CM

## 2023-03-23 DIAGNOSIS — Z1231 Encounter for screening mammogram for malignant neoplasm of breast: Secondary | ICD-10-CM | POA: Diagnosis not present

## 2023-03-23 DIAGNOSIS — Z23 Encounter for immunization: Secondary | ICD-10-CM | POA: Insufficient documentation

## 2023-03-23 MED ORDER — GABAPENTIN 300 MG PO CAPS: ORAL_CAPSULE | ORAL | 5 refills | Status: DC

## 2023-03-23 NOTE — Assessment & Plan Note (Signed)
Controlled, no change in medication  

## 2023-03-23 NOTE — Assessment & Plan Note (Signed)
Rept dexa past due, will schedule

## 2023-03-23 NOTE — Patient Instructions (Addendum)
F/U in 6 months, call if you need mne sooner, EKG at visit  Please scherdule mammogram at checkout also needs to get dexa scan   Flu vaccine today  Pls let pt know if she has had covid vaccine since September,if not she also needs a covid vaccine, let her know  You need TdAP and shingrix vaccines  Fasting lipid, cmp and EGFR, in next 2 weeks  Thanks for choosing [facility], we consider it a privelige to serve you.

## 2023-03-23 NOTE — Assessment & Plan Note (Signed)
Controlled with daily prophylactic meds, denies headache in past 6 months

## 2023-03-23 NOTE — Progress Notes (Signed)
   Sara Woods     MRN: [id number]      DOB: Mar 13, 1946  Chief Complaint  Patient presents with   Follow-up    Follow up wants flu shot needs tdap and shingles    HPI Sara Woods is here for follow up and re-evaluation of chronic medical conditions, medication management and review of any available recent lab and radiology data.  Preventive health is updated, specifically  Cancer screening and Immunization.   Questions or concerns regarding consultations or procedures which the PT has had in the interim are  addressed. The PT denies any adverse reactions to current medications since the last visit.  Acutely grieving recent loss of neighbor and good friend 3 weeks ago   ROS Denies recent fever or chills. Denies sinus pressure, nasal congestion, ear pain or sore throat. Denies chest congestion, productive cough or wheezing. Denies chest pains, palpitations and leg swelling Denies abdominal pain, nausea, vomiting,diarrhea or constipation.   Denies dysuria, frequency, hesitancy or incontinence. Denies joint pain, swelling and limitation in mobility. Denies headaches, seizures, numbness, or tingling. Denies depression, anxiety or insomnia. Denies skin break down or rash.   PE  BP 134/82 (BP Location: Left Arm, Patient Position: Sitting, Cuff Size: Large)   Pulse 99   Ht 5\' 5"  (1.651 m)   Wt 159 lb 1.9 oz (72.2 kg)   SpO2 96%   BMI 26.48 kg/m   Patient alert and oriented and in no cardiopulmonary distress.  HEENT: No facial asymmetry, EOMI,     Neck supple .  Chest: Clear to auscultation bilaterally.  CVS: S1, S2 no murmurs, no S3.Regular rate.  ABD: Soft non tender.   Ext: No edema  MS: Adequate ROM spine, shoulders, hips and knees.  Skin: Intact, no ulcerations or rash noted.  Psych: Good eye contact, normal affect. Memory intact not anxious or depressed appearing.  CNS: CN 2-12 intact, power,  normal throughout.no focal deficits noted.   Assessment &  Plan  Hyperlipidemia LDL goal <100 Hyperlipidemia:Low fat diet discussed and encouraged.   Lipid Panel  Lab Results  Component Value Date   CHOL 131 08/20/2022   HDL 58 08/20/2022   LDLCALC 56 08/20/2022   TRIG 89 08/20/2022   CHOLHDL 2.3 08/20/2022     Updated lab needed at/ before next visit.   Insomnia Controlled, no change in medication   Migraine with aura Controlled with daily prophylactic meds, denies headache in past 6 months  Osteopenia Rept dexa past due, will schedule  Essential hypertension Controlled, no change in medication DASH diet and commitment to daily physical activity for a minimum of 30 minutes discussed and encouraged, as a part of hypertension management. The importance of attaining a healthy weight is also discussed.     03/23/2023    1:54 PM 03/23/2023    1:52 PM 10/27/2022   12:00 PM 10/27/2022   10:22 AM 09/21/2022    1:08 PM 08/20/2022   10:38 AM 02/19/2022    2:29 PM  BP/Weight  Systolic BP 134 144 113 170 152 119 130  Diastolic BP 82 84 88 80 73 80 80  Wt. (Lbs)  159.12  159.5 160.4 159.12 161  BMI  26.48 kg/m2  26.14 kg/m2 26.29 kg/m2 26.48 kg/m2 26.79 kg/m2       Encounter for immunization After obtaining informed consent, the vaccine is  administered , with no adverse effect noted at the time of administration.

## 2023-03-23 NOTE — Assessment & Plan Note (Signed)
Controlled, no change in medication DASH diet and commitment to daily physical activity for a minimum of 30 minutes discussed and encouraged, as a part of hypertension management. The importance of attaining a healthy weight is also discussed.     03/23/2023    1:54 PM 03/23/2023    1:52 PM 10/27/2022   12:00 PM 10/27/2022   10:22 AM 09/21/2022    1:08 PM 08/20/2022   10:38 AM 02/19/2022    2:29 PM  BP/Weight  Systolic BP 134 144 113 170 152 119 130  Diastolic BP 82 84 88 80 73 80 80  Wt. (Lbs)  159.12  159.5 160.4 159.12 161  BMI  26.48 kg/m2  26.14 kg/m2 26.29 kg/m2 26.48 kg/m2 26.79 kg/m2

## 2023-03-23 NOTE — Assessment & Plan Note (Signed)
After obtaining informed consent, the vaccine is  administered , with no adverse effect noted at the time of administration.  

## 2023-03-23 NOTE — Assessment & Plan Note (Signed)
Hyperlipidemia:Low fat diet discussed and encouraged.   Lipid Panel  Lab Results  Component Value Date   CHOL 131 08/20/2022   HDL 58 08/20/2022   LDLCALC 56 08/20/2022   TRIG 89 08/20/2022   CHOLHDL 2.3 08/20/2022     Updated lab needed at/ before next visit.

## 2023-03-26 ENCOUNTER — Telehealth: Payer: Self-pay

## 2023-03-26 NOTE — Telephone Encounter (Signed)
Copied from CRM [id number]. Topic: General - Call Back - No Documentation >> Mar 25, 2023 12:42 PM Mosetta Putt H wrote: Reason for CRM: patient needs to leave a message for dr.simpson, Patient called [facility] and the covid shot she received is the most up to date covid shot, they stated she doesn't need another covid shot

## 2023-04-09 ENCOUNTER — Other Ambulatory Visit: Payer: Self-pay | Admitting: Family Medicine

## 2023-04-11 ENCOUNTER — Other Ambulatory Visit: Payer: Self-pay | Admitting: Family Medicine

## 2023-04-14 DIAGNOSIS — I10 Essential (primary) hypertension: Secondary | ICD-10-CM | POA: Diagnosis not present

## 2023-04-14 DIAGNOSIS — E785 Hyperlipidemia, unspecified: Secondary | ICD-10-CM | POA: Diagnosis not present

## 2023-04-15 LAB — CMP14+EGFR
ALT: 22 [IU]/L (ref 0–32)
AST: 20 [IU]/L (ref 0–40)
Albumin: 4.3 g/dL (ref 3.8–4.8)
Alkaline Phosphatase: 98 [IU]/L (ref 44–121)
BUN/Creatinine Ratio: 26 (ref 12–28)
BUN: 25 mg/dL (ref 8–27)
Bilirubin Total: 0.5 mg/dL (ref 0.0–1.2)
CO2: 16 mmol/L — ABNORMAL LOW (ref 20–29)
Calcium: 10.3 mg/dL (ref 8.7–10.3)
Chloride: 111 mmol/L — ABNORMAL HIGH (ref 96–106)
Creatinine, Ser: 0.98 mg/dL (ref 0.57–1.00)
Globulin, Total: 2.4 g/dL (ref 1.5–4.5)
Glucose: 89 mg/dL (ref 70–99)
Potassium: 3.8 mmol/L (ref 3.5–5.2)
Sodium: 146 mmol/L — ABNORMAL HIGH (ref 134–144)
Total Protein: 6.7 g/dL (ref 6.0–8.5)
eGFR: 59 mL/min/{1.73_m2} — ABNORMAL LOW (ref >59)

## 2023-04-15 LAB — LIPID PANEL
Chol/HDL Ratio: 2.7 {ratio} (ref 0.0–4.4)
Cholesterol, Total: 135 mg/dL (ref 100–199)
HDL: 50 mg/dL (ref >39)
LDL Chol Calc (NIH): 66 mg/dL (ref 0–99)
Triglycerides: 106 mg/dL (ref 0–149)
VLDL Cholesterol Cal: 19 mg/dL (ref 5–40)

## 2023-05-10 ENCOUNTER — Ambulatory Visit (HOSPITAL_COMMUNITY): Payer: Medicare Other

## 2023-05-10 ENCOUNTER — Encounter (HOSPITAL_COMMUNITY): Payer: Self-pay

## 2023-05-10 ENCOUNTER — Ambulatory Visit (HOSPITAL_COMMUNITY)
Admission: RE | Admit: 2023-05-10 | Discharge: 2023-05-10 | Disposition: A | Discharge status: Home / Self Care | Payer: Medicare Other | Source: Ambulatory Visit | Attending: Family Medicine | Admitting: Family Medicine

## 2023-05-10 ENCOUNTER — Ambulatory Visit (INDEPENDENT_AMBULATORY_CARE_PROVIDER_SITE_OTHER): Payer: Medicare Other

## 2023-05-10 VITALS — Ht 65.5 in | Wt 140.0 lb

## 2023-05-10 DIAGNOSIS — Z780 Asymptomatic menopausal state: Secondary | ICD-10-CM | POA: Insufficient documentation

## 2023-05-10 DIAGNOSIS — M85852 Other specified disorders of bone density and structure, left thigh: Secondary | ICD-10-CM | POA: Diagnosis not present

## 2023-05-10 DIAGNOSIS — Z0000 Encounter for general adult medical examination without abnormal findings: Secondary | ICD-10-CM | POA: Diagnosis not present

## 2023-05-10 DIAGNOSIS — Z1231 Encounter for screening mammogram for malignant neoplasm of breast: Secondary | ICD-10-CM | POA: Insufficient documentation

## 2023-05-10 NOTE — Progress Notes (Signed)
Because this visit was a virtual/telehealth visit,  certain criteria was not obtained, such a blood pressure, CBG if applicable, and timed get up and go. Any medications not marked as "taking" were not mentioned during the medication reconciliation part of the visit. Any vitals not documented were not able to be obtained due to this being a telehealth visit or patient was unable to self-report a recent blood pressure reading due to a lack of equipment at home via telehealth. Vitals that have been documented are verbally provided by the patient.   Subjective:   Sara Woods is a 77 y.o. female who presents for Medicare Annual (Subsequent) preventive examination.  Visit Complete: Virtual I connected with  Virgel Bouquet on 05/10/23 by a audio enabled telemedicine application and verified that I am speaking with the correct person using two identifiers.  Patient Location: Home  Provider Location: Home Office  I discussed the limitations of evaluation and management by telemedicine. The patient expressed understanding and agreed to proceed.  Vital Signs: Because this visit was a virtual/telehealth visit, some criteria may be missing or patient reported. Any vitals not documented were not able to be obtained and vitals that have been documented are patient reported.  Patient Medicare AWV questionnaire was completed by the patient on na; I have confirmed that all information answered by patient is correct and no changes since this date.  Cardiac Risk Factors include: advanced age (>26men, >56 women);dyslipidemia;hypertension     Objective:    Today's Vitals   05/10/23 1413 05/10/23 1414  Weight: 140 lb (63.5 kg)   Height: 5' 5.5" (1.664 m)   PainSc:  0-No pain   Body mass index is 22.94 kg/m.     05/10/2023    2:11 PM 10/27/2022   10:09 AM 04/06/2022    2:39 PM 03/31/2021    3:46 PM 03/28/2020    1:14 PM 03/28/2019    1:04 PM 11/01/2018    8:07 AM  Advanced Directives  Does  Patient Have a Medical Advance Directive? No No Yes Yes;No Yes No;Yes Yes  Type of Surveyor, minerals;Living will  Healthcare Power of [place];Living will Living will;Healthcare Power of Attorney Living will  Does patient want to make changes to medical advance directive?    No - Patient declined   No - Patient declined  Copy of Healthcare Power of Attorney in Chart?   No - copy requested  No - copy requested  No - copy requested  Would patient like information on creating a medical advance directive? No - Patient declined No - Patient declined  No - Patient declined       Current Medications (verified) Outpatient Encounter Medications as of 05/10/2023  Medication Sig   amLODipine (NORVASC) 5 MG tablet TAKE ONE TABLET BY MOUTH DAILY   Calcium-Vitamin D (CALTRATE 600 PLUS-VIT D PO) Take 2 tablets by mouth daily.   diclofenac (VOLTAREN) 50 MG EC tablet TAKE 1 TABLET BY MOUTH TWICE DAILY   doxepin (SINEQUAN) 10 MG capsule TAKE 2 CAPSULE BY MOUTH EVERY NIGHT AT BEDTIME   fluticasone (FLONASE) 50 MCG/ACT nasal spray place TWO SPRAYS into BOTH nostrils DAILY   gabapentin (NEURONTIN) 300 MG capsule TAKE 1 CAPSULE BY MOUTH TWICE DAILY AS NEEDED FOR PAIN   KRILL OIL 1000 MG CAPS Take 1 capsule by mouth daily.   lisinopril (ZESTRIL) 40 MG tablet TAKE ONE TABLET BY MOUTH DAILY   meclizine (ANTIVERT) 25 MG tablet TAKE ONE TABLET  BY MOUTH THREE TIMES DAILY AS NEEDED FOR DIZZINESS   multivitamin (THERAGRAN) per tablet Take 1 tablet by mouth daily.   rosuvastatin (CRESTOR) 40 MG tablet TAKE 1 TABLET BY MOUTH DAILY   topiramate (TOPAMAX) 50 MG tablet TAKE ONE TABLET BY MOUTH TWICE DAILY   No facility-administered encounter medications on file as of 05/10/2023.    Allergies (verified) Adhesive [tape], Penicillins, and Sulfonamide derivatives   History: Past Medical History:  Diagnosis Date   Allergic rhinitis    Anxiety    Arthritis    Depression approx 1983    History of migraines    Hyperlipidemia approx 2006   Hypertension approx 2000   MRSA infection    Recurrent abcesses to legs about 8 years ago   Recurrent miscarriages due to luteal phase defect, not pregnant    5 miscarriages with 1 living adult son   Past Surgical History:  Procedure Laterality Date   BREAST EXCISIONAL BIOPSY Left    COLONOSCOPY WITH PROPOFOL N/A 10/27/2022   Procedure: COLONOSCOPY WITH PROPOFOL;  Surgeon: Lanelle Bal, DO;  [facility];  Service: Endoscopy;  Laterality: N/A;  11:15 AM, ASA 2   ETHMOIDECTOMY Right 11/01/2018   Procedure: RIGHT TOTAL ETHMOIDECTOMY;  Surgeon: Newman Pies, MD;  Location: [facility];  Service: ENT;  Laterality: Right;   EYE SURGERY Left 01/13/2021   FRONTAL SINUS EXPLORATION Right 11/01/2018   Procedure: FRONTAL SINUS EXPLORATION;  Surgeon: Newman Pies, MD;  Location: [facility];  Service: ENT;  Laterality: Right;   MAXILLARY ANTROSTOMY Right 11/01/2018   Procedure: RIGHT ENDOSCOPIC MAXILLARY ANTROSTOMY WITH TISSUE REMOVAL;  Surgeon: Newman Pies, MD;  Location: [facility];  Service: ENT;  Laterality: Right;   NASAL SEPTOPLASTY W/ TURBINOPLASTY N/A 11/01/2018   Procedure: NASAL SEPTOPLASTY WITH TURBINATE REDUCTION;  Surgeon: Newman Pies, MD;  Location: [facility];  Service: ENT;  Laterality: N/A;   SINUS ENDO WITH FUSION N/A 11/01/2018   Procedure: SINUS ENDO WITH FUSION;  Surgeon: Newman Pies, MD;  Location: [facility];  Service: ENT;  Laterality: N/A;   SKIN CANCER DESTRUCTION     Back of left leg, right shoulder   SPHENOIDECTOMY Right 11/01/2018   Procedure: RIGHT SPHENOIDECTOMY WITH TISSUE REMOVAL;  Surgeon: Newman Pies, MD;  Location: [facility];  Service: ENT;  Laterality: Right;   TONSILLECTOMY     Childhood   TUBAL LIGATION     TUMOR EXCISION     Tongue   Family History  Problem Relation Age of Onset   Prostate cancer Father         metastic, 04/2006   Obesity Sister    Fibromyalgia Sister    Osteoporosis Mother    Cerebrovascular Disease Mother    Brain cancer Mother    Cerebrovascular Disease Brother    Stroke Son        x3, Vertebral artery dissection, January 2009   Hypertension Son    Social History   Socioeconomic History   Marital status: Married    Spouse name: Gery Pray   Number of children: 1   Years of education: 12+   Highest education level: 12th grade  Occupational History   Occupation: retired   Tobacco Use   Smoking status: Never   Smokeless tobacco: Never  Vaping Use   Vaping status: Never Used  Substance and Sexual Activity   Alcohol use: No   Drug use: No   Sexual activity: Not on  file  Other Topics Concern   Not on file  Social History Narrative   Married, lives with spouse   Spouse admitted for lumbar surgery         Social Drivers of Health   Financial Resource Strain: Low Risk  (05/10/2023)   Overall Financial Resource Strain (CARDIA)    Difficulty of Paying Living Expenses: Not hard at all  Food Insecurity: No Food Insecurity (05/10/2023)   Hunger Vital Sign    Worried About Running Out of Food in the Last Year: Never true    Ran Out of Food in the Last Year: Never true  Transportation Needs: No Transportation Needs (05/10/2023)   PRAPARE - Administrator, Civil Service (Medical): No    Lack of Transportation (Non-Medical): No  Physical Activity: Insufficiently Active (05/10/2023)   Exercise Vital Sign    Days of Exercise per Week: 3 days    Minutes of Exercise per Session: 20 min  Stress: No Stress Concern Present (05/10/2023)   [employer] of Occupational Health - Occupational Stress Questionnaire    Feeling of Stress : Only a little  Social Connections: Moderately Isolated (05/10/2023)   Social Connection and Isolation Panel [NHANES]    Frequency of Communication with Friends and Family: More than three times a week    Frequency of Social  Gatherings with Friends and Family: Once a week    Attends Religious Services: Never    Database administrator or Organizations: No    Attends Engineer, structural: Never    Marital Status: Married    Tobacco Counseling Counseling given: Yes   Clinical Intake:  Pre-visit preparation completed: Yes  Pain : No/denies pain Pain Score: 0-No pain     BMI - recorded: 22.94 Nutritional Status: BMI of 19-24  Normal Nutritional Risks: None Diabetes: No  How often do you need to have someone help you when you read instructions, pamphlets, or other written materials from your doctor or pharmacy?: 1 - Never  Interpreter Needed?: No  Information entered by :: Abby Jaeda Bruso, CMA   Activities of Daily Living    05/10/2023    2:16 PM  In your present state of health, do you have any difficulty performing the following activities:  Hearing? 0  Vision? 0  Difficulty concentrating or making decisions? 0  Walking or climbing stairs? 0  Dressing or bathing? 0  Doing errands, shopping? 0  Preparing Food and eating ? N  Using the Toilet? N  In the past six months, have you accidently leaked urine? N  Do you have problems with loss of bowel control? N  Managing your Medications? N  Managing your Finances? N  Housekeeping or managing your Housekeeping? N    Patient Care Team: Kerri Perches, MD as PCP - Dia Crawford, MD as Referring Physician (Unknown Physician Specialty)  Indicate any recent Medical Services you may have received from other than [facility] providers in the past year (date may be approximate).     Assessment:   This is a routine wellness examination for Mai.  Hearing/Vision screen Hearing Screening - Comments:: Patient denies any hearing difficulties.   Vision Screening - Comments:: Wears rx glasses - up to date with routine eye exams  [facility]    Goals Addressed             This Visit's Progress    Patient  Stated       Get our yard back  in good shape.        Depression Screen    05/10/2023    2:17 PM 03/23/2023    1:53 PM 08/20/2022   10:41 AM 04/06/2022    2:41 PM 02/19/2022    2:29 PM 08/20/2021    3:16 PM 06/17/2021    3:04 PM  PHQ 2/9 Scores  PHQ - 2 Score 0 0 0 0 0 0 0    Fall Risk    05/10/2023    2:16 PM 03/23/2023    1:53 PM 08/20/2022   10:41 AM 04/06/2022    2:41 PM 02/19/2022    2:29 PM  Fall Risk   Falls in the past year? 0 0 0 0 0  Number falls in past yr: 0 0 0 0 0  Injury with Fall? 0 0 0 0 0  Risk for fall due to : No Fall Risks No Fall Risks No Fall Risks No Fall Risks No Fall Risks  Follow up Falls prevention discussed Falls evaluation completed Falls evaluation completed Falls evaluation completed     MEDICARE RISK AT HOME: Medicare Risk at Home Any stairs in or around the home?: Yes If so, are there any without handrails?: No Home free of loose throw rugs in walkways, pet beds, electrical cords, etc?: Yes Adequate lighting in your home to reduce risk of falls?: Yes Life alert?: Yes Use of a cane, walker or w/c?: No Grab bars in the bathroom?: Yes Shower chair or bench in shower?: Yes Elevated toilet seat or a handicapped toilet?: No  TIMED UP AND GO:  Was the test performed?  No    Cognitive Function:        05/10/2023    2:15 PM 04/06/2022    2:43 PM 03/31/2021    3:52 PM 03/28/2020    1:16 PM 03/28/2019    1:09 PM  6CIT Screen  What Year? 0 points 0 points 0 points 0 points 0 points  What month? 0 points 0 points 0 points 0 points 0 points  What time? 0 points 0 points 0 points 0 points 0 points  Count back from 20 0 points 0 points 0 points 0 points 0 points  Months in reverse 0 points 0 points 2 points 0 points 0 points  Repeat phrase 0 points 0 points 0 points 0 points 0 points  Total Score 0 points 0 points 2 points 0 points 0 points    Immunizations Immunization History  Administered Date(s) Administered   Fluad Quad(high  Dose 65+) 02/11/2021, 03/05/2022   Fluad Trivalent(High Dose 65+) 03/23/2023   H1N1 04/19/2008   Influenza Split 01/30/2011, 02/24/2012   Influenza Whole 02/23/2007, 02/23/2008   Influenza,inj,Quad PF,6+ Mos 02/27/2013, 04/19/2014, 02/26/2015, 03/23/2016, 01/06/2017, 12/23/2017   Influenza-Unspecified 02/28/2020   Moderna Covid-19 Vaccine Bivalent Booster 34yrs & up 01/20/2023   Moderna Sars-Covid-2 Vaccination 07/06/2019, 07/30/2019, 04/29/2020, 03/25/2021   Pneumococcal Conjugate-13 12/06/2013   Pneumococcal Polysaccharide-23 12/19/2010   Respiratory Syncytial Virus Vaccine,Recomb Aduvanted(Arexvy) 04/17/2022   Td 02/24/2010   Unspecified SARS-COV-2 Vaccination 03/09/2022   Zoster, Live 12/19/2010    TDAP status: Due, Education has been provided regarding the importance of this vaccine. Advised may receive this vaccine at local pharmacy or Health Dept. Aware to provide a copy of the vaccination record if obtained from local pharmacy or Health Dept. Verbalized acceptance and understanding.  Flu Vaccine status: Up to date  Pneumococcal vaccine status: Up to date  Covid-19 vaccine status: Information provided on how to obtain vaccines.  Qualifies for Shingles Vaccine? Yes   Zostavax completed Yes   Shingrix Completed?: No.    Education has been provided regarding the importance of this vaccine. Patient has been advised to call insurance company to determine out of pocket expense if they have not yet received this vaccine. Advised may also receive vaccine at local pharmacy or Health Dept. Verbalized acceptance and understanding.  Screening Tests Health Maintenance  Topic Date Due   Zoster Vaccines- Shingrix (1 of 2) 05/23/1964   DTaP/Tdap/Td (2 - Tdap) 02/25/2020   COVID-19 Vaccine (7 - 2024-25 season) 03/17/2023   Medicare Annual Wellness (AWV)  04/07/2023   Pneumonia Vaccine 42+ Years old  Completed   INFLUENZA VACCINE  Completed   DEXA SCAN  Completed   Hepatitis C Screening   Completed   HPV VACCINES  Aged Out   Colonoscopy  Discontinued    Health Maintenance  Health Maintenance Due  Topic Date Due   Zoster Vaccines- Shingrix (1 of 2) 05/23/1964   DTaP/Tdap/Td (2 - Tdap) 02/25/2020   COVID-19 Vaccine (7 - 2024-25 season) 03/17/2023   Medicare Annual Wellness (AWV)  04/07/2023    Colorectal cancer screening: No longer required.   Mammogram Status: Scheduled for 05/10/2023  Bone Density status: Completed 05/10/2023. Results reflect: Bone density results: OSTEOPENIA. Repeat every 2 years.  Lung Cancer Screening: (Low Dose CT Chest recommended if Age 37-80 years, 20 pack-year currently smoking OR have quit w/in 15years.) does not qualify.   Lung Cancer Screening Referral: na  Additional Screening:  Hepatitis C Screening: does not qualify; Completed   Vision Screening: Recommended annual ophthalmology exams for early detection of glaucoma and other disorders of the eye. Is the patient up to date with their annual eye exam?  Yes  Who is the provider or what is the name of the office in which the patient attends annual eye exams? [facility] If pt is not established with a provider, would they like to be referred to a provider to establish care? No .   Dental Screening: Recommended annual dental exams for proper oral hygiene  Diabetic Foot Exam: na  Community Resource Referral / Chronic Care Management: CRR required this visit?  No   CCM required this visit?  No     Plan:     I have personally reviewed and noted the following in the patient's chart:   Medical and social history Use of alcohol, tobacco or illicit drugs  Current medications and supplements including opioid prescriptions. Patient is not currently taking opioid prescriptions. Functional ability and status Nutritional status Physical activity Advanced directives List of other physicians Hospitalizations, surgeries, and ER visits in previous 12  months Vitals Screenings to include cognitive, depression, and falls Referrals and appointments  In addition, I have reviewed and discussed with patient certain preventive protocols, quality metrics, and best practice recommendations. A written personalized care plan for preventive services as well as general preventive health recommendations were provided to patient.     Jordan Hawks Jamirra Curnow, CMA   05/10/2023   After Visit Summary: (Mail) Due to this being a telephonic visit, the after visit summary with patients personalized plan was offered to patient via mail   Nurse Notes: none

## 2023-05-10 NOTE — Patient Instructions (Signed)
Sara Woods , Thank you for taking time to come for your Medicare Wellness Visit. I appreciate your ongoing commitment to your health goals. Please review the following plan we discussed and let me know if I can assist you in the future.   Referrals/Orders/Follow-Ups/Clinician Recommendations:  May 10, 2024 at 10:00 am virtual visit  This is a list of the screening recommended for you and due dates:  Health Maintenance  Topic Date Due   Zoster (Shingles) Vaccine (1 of 2) 05/23/1964   DTaP/Tdap/Td vaccine (2 - Tdap) 02/25/2020   COVID-19 Vaccine (7 - 2024-25 season) 03/17/2023   Mammogram  05/07/2023   Medicare Annual Wellness Visit  05/09/2024   DEXA scan (bone density measurement)  05/09/2025   Pneumonia Vaccine  Completed   Flu Shot  Completed   Hepatitis C Screening  Completed   HPV Vaccine  Aged Out   Colon Cancer Screening  Discontinued    Advanced directives: (Declined) Advance directive discussed with you today. Even though you declined this today, please call our office should you change your mind, and we can give you the proper paperwork for you to fill out.  Next Medicare Annual Wellness Visit scheduled for next year: Yes  Preventive Care 1 Years and Older, Female Preventive care refers to lifestyle choices and visits with your health care provider that can promote health and wellness. Preventive care visits are also called wellness exams. What can I expect for my preventive care visit? Counseling Your health care provider may ask you questions about your: Medical history, including: Past medical problems. Family medical history. Pregnancy and menstrual history. History of falls. Current health, including: Memory and ability to understand (cognition). Emotional well-being. Home life and relationship well-being. Sexual activity and sexual health. Lifestyle, including: Alcohol, nicotine or tobacco, and drug use. Access to firearms. Diet, exercise, and sleep  habits. Work and work Astronomer. Sunscreen use. Safety issues such as seatbelt and bike helmet use. Physical exam Your health care provider will check your: Height and weight. These may be used to calculate your BMI (body mass index). BMI is a measurement that tells if you are at a healthy weight. Waist circumference. This measures the distance around your waistline. This measurement also tells if you are at a healthy weight and may help predict your risk of certain diseases, such as type 2 diabetes and high blood pressure. Heart rate and blood pressure. Body temperature. Skin for abnormal spots. What immunizations do I need?  Vaccines are usually given at various ages, according to a schedule. Your health care provider will recommend vaccines for you based on your age, medical history, and lifestyle or other factors, such as travel or where you work. What tests do I need? Screening Your health care provider may recommend screening tests for certain conditions. This may include: Lipid and cholesterol levels. Hepatitis C test. Hepatitis B test. HIV (human immunodeficiency virus) test. STI (sexually transmitted infection) testing, if you are at risk. Lung cancer screening. Colorectal cancer screening. Diabetes screening. This is done by checking your blood sugar (glucose) after you have not eaten for a while (fasting). Mammogram. Talk with your health care provider about how often you should have regular mammograms. BRCA-related cancer screening. This may be done if you have a family history of breast, ovarian, tubal, or peritoneal cancers. Bone density scan. This is done to screen for osteoporosis. Talk with your health care provider about your test results, treatment options, and if necessary, the need for more tests. Follow  these instructions at home: Eating and drinking  Eat a diet that includes fresh fruits and vegetables, whole grains, lean protein, and low-fat dairy products.  Limit your intake of foods with high amounts of sugar, saturated fats, and salt. Take vitamin and mineral supplements as recommended by your health care provider. Do not drink alcohol if your health care provider tells you not to drink. If you drink alcohol: Limit how much you have to 0-1 drink a day. Know how much alcohol is in your drink. In the [place] one drink equals one 12 oz bottle of beer (355 mL), one 5 oz glass of wine (148 mL), or one 1 oz glass of hard liquor (44 mL). Lifestyle Brush your teeth every morning and night with fluoride toothpaste. Floss one time each day. Exercise for at least 30 minutes 5 or more days each week. Do not use any products that contain nicotine or tobacco. These products include cigarettes, chewing tobacco, and vaping devices, such as e-cigarettes. If you need help quitting, ask your health care provider. Do not use drugs. If you are sexually active, practice safe sex. Use a condom or other form of protection in order to prevent STIs. Take aspirin only as told by your health care provider. Make sure that you understand how much to take and what form to take. Work with your health care provider to find out whether it is safe and beneficial for you to take aspirin daily. Ask your health care provider if you need to take a cholesterol-lowering medicine (statin). Find healthy ways to manage stress, such as: Meditation, yoga, or listening to music. Journaling. Talking to a trusted person. Spending time with friends and family. Minimize exposure to UV radiation to reduce your risk of skin cancer. Safety Always wear your seat belt while driving or riding in a vehicle. Do not drive: If you have been drinking alcohol. Do not ride with someone who has been drinking. When you are tired or distracted. While texting. If you have been using any mind-altering substances or drugs. Wear a helmet and other protective equipment during sports activities. If you have  firearms in your house, make sure you follow all gun safety procedures. What's next? Visit your health care provider once a year for an annual wellness visit. Ask your health care provider how often you should have your eyes and teeth checked. Stay up to date on all vaccines. This information is not intended to replace advice given to you by your health care provider. Make sure you discuss any questions you have with your health care provider. Document Revised: 10/23/2020 Document Reviewed: 10/23/2020 Elsevier Patient Education  2024 [employer]. Understanding Your Risk for Falls Millions of people have serious injuries from falls each year. It is important to understand your risk of falling. Talk with your health care provider about your risk and what you can do to lower it. If you do have a serious fall, make sure to tell your provider. Falling once raises your risk of falling again. How can falls affect me? Serious injuries from falls are common. These include: Broken bones, such as hip fractures. Head injuries, such as traumatic brain injuries (TBI) or concussions. A fear of falling can cause you to avoid activities and stay at home. This can make your muscles weaker and raise your risk for a fall. What can increase my risk? There are a number of risk factors that increase your risk for falling. The more risk factors you have, the higher  your risk of falling. Serious injuries from a fall happen most often to people who are older than 77 years old. Teenagers and young adults ages 72-29 are also at higher risk. Common risk factors include: Weakness in the lower body. Being generally weak or confused due to long-term (chronic) illness. Dizziness or balance problems. Poor vision. Medicines that cause dizziness or drowsiness. These may include: Medicines for your blood pressure, heart, anxiety, insomnia, or swelling (edema). Pain medicines. Muscle relaxants. Other risk factors  include: Drinking alcohol. Having had a fall in the past. Having foot pain or wearing improper footwear. Working at a dangerous job. Having any of the following in your home: Tripping hazards, such as floor clutter or loose rugs. Poor lighting. Pets. Having dementia or memory loss. What actions can I take to lower my risk of falling?     Physical activity Stay physically fit. Do strength and balance exercises. Consider taking a regular class to build strength and balance. Yoga and tai chi are good options. Vision Have your eyes checked every year and your prescription for glasses or contacts updated as needed. Shoes and walking aids Wear non-skid shoes. Wear shoes that have rubber soles and low heels. Do not wear high heels. Do not walk around the house in socks or slippers. Use a cane or walker as told by your provider. Home safety Attach secure railings on both sides of your stairs. Install grab bars for your bathtub, shower, and toilet. Use a non-skid mat in your bathtub or shower. Attach bath mats securely with double-sided, non-slip rug tape. Use good lighting in all rooms. Keep a flashlight near your bed. Make sure there is a clear path from your bed to the bathroom. Use night-lights. Do not use throw rugs. Make sure all carpeting is taped or tacked down securely. Remove all clutter from walkways and stairways, including extension cords. Repair uneven or broken steps and floors. Avoid walking on icy or slippery surfaces. Walk on the grass instead of on icy or slick sidewalks. Use ice melter to get rid of ice on walkways in the winter. Use a cordless phone. Questions to ask your health care provider Can you help me check my risk for a fall? Do any of my medicines make me more likely to fall? Should I take a vitamin D supplement? What exercises can I do to improve my strength and balance? Should I make an appointment to have my vision checked? Do I need a bone density test  to check for weak bones (osteoporosis)? Would it help to use a cane or a walker? Where to find more information Centers for Disease Control and Prevention, [employer]: TonerPromos.no Community-Based Fall Prevention Programs: TonerPromos.no [employer] on Aging: BaseRingTones.pl Contact a health care provider if: You fall at home. You are afraid of falling at home. You feel weak, drowsy, or dizzy. This information is not intended to replace advice given to you by your health care provider. Make sure you discuss any questions you have with your health care provider. Document Revised: 12/29/2021 Document Reviewed: 12/29/2021 Elsevier Patient Education  2024 [employer]. Advance Directive  Advance directives are legal documents that allow you to make decisions about your health care and medical treatment in case you become unable to communicate for yourself. Advance directives let your wishes be known to family, friends, and health care providers. Discussing and writing advance directives should happen over time rather than all at once. Advance directives can be changed and updated at any time.  There are different types of advance directives, such as: Medical power of attorney. Living will. Do not resuscitate (DNR) order or do not attempt resuscitation (DNAR) order. Health care proxy and medical power of attorney A health care proxy is also called a health care agent. This person is appointed to make medical decisions for you when you are unable to make decisions for yourself. Generally, people ask a trusted friend or family member to act as their proxy and represent their preferences. Make sure you have an agreement with your trusted person to act as your proxy. A proxy may have to make a medical decision on your behalf if your wishes are not known. A medical power of attorney, also called a durable power of attorney for health care, is a legal document that names your health care proxy. Depending on the laws  in your state, the document may need to be: Signed. Notarized. Dated. Copied. Witnessed. Incorporated into your medical record. You may also want to appoint a trusted person to manage your money in the event you are unable to do so. This is called a durable power of attorney for finances. It is a separate legal document from the durable power of attorney for health care. You may choose your health care proxy or someone different to act as your agent in money matters. If you do not appoint a proxy, or there is a concern that the proxy is not acting in your best interest, a court may appoint a guardian to act on your behalf. Living will A living will is a set of instructions that state your wishes about medical care when you cannot express them yourself. Health care providers should keep a copy of your living will in your medical record. You may want to give a copy to family members or friends. To alert caregivers in case of an emergency, you can place a card in your wallet to let them know that you have a living will and where they can find it. A living will is used if you become: Terminally ill. Disabled. Unable to communicate or make decisions. The following decisions should be included in your living will: To use or not to use life support equipment, such as dialysis machines and breathing machines (ventilators). Whether you want a DNR or DNAR order. This tells health care providers not to use cardiopulmonary resuscitation (CPR) if breathing or heartbeat stops. To use or not to use tube feeding. To be given or not to be given food and fluids. Whether you want comfort (palliative) care when the goal becomes comfort rather than a cure. Whether you want to donate your organs and tissues. A living will does not give instructions for distributing your money and property if you should pass away. DNR or DNAR A DNR or DNAR order is a request not to have CPR in the event that your heart stops beating  or you stop breathing. If a DNR or DNAR order has not been made and shared, a health care provider will try to help any patient whose heart has stopped or who has stopped breathing. If you plan to have surgery, talk with your health care provider about how your DNR or DNAR order will be followed if problems occur. What if I do not have an advance directive? Some states assign family decision makers to act on your behalf if you do not have an advance directive. Each state has its own laws about advance directives. You may want to check  with your health care provider, attorney, or state representative about the laws in your state. Summary Advance directives are legal documents that allow you to make decisions about your health care and medical treatment in case you become unable to communicate for yourself. The process of discussing and writing advance directives should happen over time. You can change and update advance directives at any time. Advance directives may include a medical power of attorney, a living will, and a DNR or DNAR order. This information is not intended to replace advice given to you by your health care provider. Make sure you discuss any questions you have with your health care provider. Document Revised: 01/30/2020 Document Reviewed: 01/30/2020 Elsevier Patient Education  2024 [employer].

## 2023-05-11 ENCOUNTER — Other Ambulatory Visit: Payer: Self-pay | Admitting: Family Medicine

## 2023-05-14 ENCOUNTER — Other Ambulatory Visit: Payer: Self-pay | Admitting: Family Medicine

## 2023-06-10 ENCOUNTER — Other Ambulatory Visit: Payer: Self-pay | Admitting: Family Medicine

## 2023-07-08 ENCOUNTER — Other Ambulatory Visit: Payer: Self-pay | Admitting: Family Medicine

## 2023-08-09 ENCOUNTER — Other Ambulatory Visit: Payer: Self-pay | Admitting: Family Medicine

## 2023-09-21 ENCOUNTER — Telehealth: Payer: Medicare Other | Admitting: Family Medicine

## 2023-10-08 ENCOUNTER — Other Ambulatory Visit: Payer: Self-pay | Admitting: Family Medicine

## 2023-10-12 ENCOUNTER — Encounter: Payer: Self-pay | Admitting: Family Medicine

## 2023-10-12 ENCOUNTER — Ambulatory Visit (INDEPENDENT_AMBULATORY_CARE_PROVIDER_SITE_OTHER): Payer: Self-pay | Admitting: Family Medicine

## 2023-10-12 VITALS — BP 156/96 | HR 75 | Resp 16 | Ht 65.5 in | Wt 152.0 lb

## 2023-10-12 DIAGNOSIS — G43109 Migraine with aura, not intractable, without status migrainosus: Secondary | ICD-10-CM | POA: Diagnosis not present

## 2023-10-12 DIAGNOSIS — F5104 Psychophysiologic insomnia: Secondary | ICD-10-CM

## 2023-10-12 DIAGNOSIS — J309 Allergic rhinitis, unspecified: Secondary | ICD-10-CM

## 2023-10-12 DIAGNOSIS — R42 Dizziness and giddiness: Secondary | ICD-10-CM | POA: Diagnosis not present

## 2023-10-12 DIAGNOSIS — I10 Essential (primary) hypertension: Secondary | ICD-10-CM

## 2023-10-12 DIAGNOSIS — E559 Vitamin D deficiency, unspecified: Secondary | ICD-10-CM | POA: Diagnosis not present

## 2023-10-12 DIAGNOSIS — E785 Hyperlipidemia, unspecified: Secondary | ICD-10-CM

## 2023-10-12 NOTE — Assessment & Plan Note (Signed)
 Reports uncontrolled, needs to use flonase  daily

## 2023-10-12 NOTE — Progress Notes (Signed)
   Sara Woods     MRN: [id number]      DOB: 05-24-1945  Chief Complaint  Patient presents with   Hypertension    6 month follow up     HPI Sara Woods is here for follow up and re-evaluation of chronic medical conditions, medication management and review of any available recent lab and radiology data.  Preventive health is updated, specifically  Cancer screening and Immunization.   Questions or concerns regarding consultations or procedures which the PT has had in the interim are  addressed. The PT denies any adverse reactions to current medications since the last visit.  There are no new concerns.  There are no specific complaints   ROS Denies recent fever or chills. Denies sinus pressure, nasal congestion, ear pain or sore throat. Denies chest congestion, productive cough or wheezing. Denies chest pains, palpitations and leg swelling Denies abdominal pain, nausea, vomiting,diarrhea or constipation.   Denies dysuria, frequency, hesitancy or incontinence. Denies joint pain, swelling and limitation in mobility. Denies uncontrolled headaches, seizures, numbness, or tingling. Denies depression, anxiety or insomnia. Denies skin break down or rash.   PE  BP (!) 156/96   Pulse 75   Resp 16   Ht 5' 5.5" (1.664 m)   Wt 152 lb 0.6 oz (69 kg)   SpO2 96%   BMI 24.92 kg/m   Patient alert and oriented and in no cardiopulmonary distress.  HEENT: No facial asymmetry, EOMI,     Neck supple .  Chest: Clear to auscultation bilaterally.  CVS: S1, S2 no murmurs, no S3.Regular rate.  ABD: Soft non tender.   Ext: No edema  MS: Adequate ROM spine, shoulders, hips and knees.  Skin: Intact, no ulcerations or rash noted.  Psych: Good eye contact, normal affect. Memory intact not anxious or depressed appearing.  CNS: CN 2-12 intact, power,  normal throughout.no focal deficits noted.   Assessment & Plan  Essential hypertension DASH diet and commitment to daily physical activity  for a minimum of 30 minutes discussed and encouraged, as a part of hypertension management. The importance of attaining a healthy weight is also discussed. Generally uncontrolled, will re eval in 4 weeks, if high will add amlodipine  2.5 mg daily     10/12/2023    1:40 PM 05/10/2023    2:13 PM 03/23/2023    1:54 PM 03/23/2023    1:52 PM 10/27/2022   12:00 PM 10/27/2022   10:22 AM 09/21/2022    1:08 PM  BP/Weight  Systolic BP 149 -- 134 144 113 170 152  Diastolic BP 93 -- 82 84 88 80 73  Wt. (Lbs) 152.04 140  159.12  159.5 160.4  BMI 24.92 kg/m2 22.94 kg/m2  26.48 kg/m2  26.14 kg/m2 26.29 kg/m2       Hyperlipidemia LDL goal <100 Hyperlipidemia:Low fat diet discussed and encouraged.   Lipid Panel  Lab Results  Component Value Date   CHOL 135 04/14/2023   HDL 50 04/14/2023   LDLCALC 66 04/14/2023   TRIG 106 04/14/2023   CHOLHDL 2.7 04/14/2023    Updated lab needed at/ before next visit.   Vitamin D  deficiency Updated lab needed at/ before next visit.   Vertigo Intermittent associated with uncontrolled sinus symptoms, took one meclizine  this morning  Allergic rhinitis Reports uncontrolled, needs to use flonase  daily  Insomnia Controlled, no change in medication   Migraine with aura Controlled, no change in medication

## 2023-10-12 NOTE — Assessment & Plan Note (Addendum)
 DASH diet and commitment to daily physical activity for a minimum of 30 minutes discussed and encouraged, as a part of hypertension management. The importance of attaining a healthy weight is also discussed. Generally uncontrolled, will re eval in 4 weeks, if high will add amlodipine  2.5 mg daily     10/12/2023    1:40 PM 05/10/2023    2:13 PM 03/23/2023    1:54 PM 03/23/2023    1:52 PM 10/27/2022   12:00 PM 10/27/2022   10:22 AM 09/21/2022    1:08 PM  BP/Weight  Systolic BP 149 -- 134 144 113 170 152  Diastolic BP 93 -- 82 84 88 80 73  Wt. (Lbs) 152.04 140  159.12  159.5 160.4  BMI 24.92 kg/m2 22.94 kg/m2  26.48 kg/m2  26.14 kg/m2 26.29 kg/m2

## 2023-10-12 NOTE — Assessment & Plan Note (Signed)
 Controlled, no change in medication

## 2023-10-12 NOTE — Assessment & Plan Note (Signed)
 Updated lab needed at/ before next visit.

## 2023-10-12 NOTE — Patient Instructions (Addendum)
 F/u in  4 weeks, please bring your medication and your home blood pressure cuff  to the visit, call if you need me sooner  Pls get fasting CBC, lipid, cmp and eGFR, TSH and vit d in next 1 week  Blood pressure at visit is high today and has been on several other occasions  Please check blood pressure at least 3 times per week and write it down ( nurse pls give blood pressure card)   If your blood pressure is generally 145 or more , that is high and you need more medication, this can be fixed.  It is important that you exercise regularly at least 30 minutes 5 times a week. If you develop chest pain, have severe difficulty breathing, or feel very tired, stop exercising immediately and seek medical attention

## 2023-10-12 NOTE — Assessment & Plan Note (Signed)
 Intermittent associated with uncontrolled sinus symptoms, took one meclizine  this morning

## 2023-10-12 NOTE — Assessment & Plan Note (Signed)
 Hyperlipidemia:Low fat diet discussed and encouraged.   Lipid Panel  Lab Results  Component Value Date   CHOL 135 04/14/2023   HDL 50 04/14/2023   LDLCALC 66 04/14/2023   TRIG 106 04/14/2023   CHOLHDL 2.7 04/14/2023    Updated lab needed at/ before next visit.

## 2023-11-06 ENCOUNTER — Other Ambulatory Visit: Payer: Self-pay | Admitting: Family Medicine

## 2023-11-18 ENCOUNTER — Ambulatory Visit: Admitting: Family Medicine

## 2023-11-18 ENCOUNTER — Encounter: Payer: Self-pay | Admitting: Family Medicine

## 2023-11-18 VITALS — BP 150/86 | HR 84 | Resp 18 | Ht 65.5 in | Wt 155.0 lb

## 2023-11-18 DIAGNOSIS — E785 Hyperlipidemia, unspecified: Secondary | ICD-10-CM | POA: Diagnosis not present

## 2023-11-18 DIAGNOSIS — I10 Essential (primary) hypertension: Secondary | ICD-10-CM | POA: Diagnosis not present

## 2023-11-18 DIAGNOSIS — E559 Vitamin D deficiency, unspecified: Secondary | ICD-10-CM | POA: Diagnosis not present

## 2023-11-18 MED ORDER — AMLODIPINE BESYLATE 2.5 MG PO TABS: 2.5000 mg | ORAL_TABLET | Freq: Every day | ORAL | 2 refills | Status: DC

## 2023-11-18 NOTE — Assessment & Plan Note (Signed)
 Uncontrolled , add amlodipine  2.5 mg daily DASH diet and commitment to daily physical activity for a minimum of 30 minutes discussed and encouraged, as a part of hypertension management. The importance of attaining a healthy weight is also discussed.     11/18/2023    2:18 PM 11/18/2023    1:42 PM 10/12/2023    2:23 PM 10/12/2023    1:40 PM 05/10/2023    2:13 PM 03/23/2023    1:54 PM 03/23/2023    1:52 PM  BP/Weight  Systolic BP 150 134 156 149 -- 134 144  Diastolic BP 86 80 96 93 -- 82 84  Wt. (Lbs)  155.02  152.04 140  159.12  BMI  25.4 kg/m2  24.92 kg/m2 22.94 kg/m2  26.48 kg/m2

## 2023-11-18 NOTE — Progress Notes (Signed)
   Sara Woods     MRN: [id number]      DOB: [birth year]  Chief Complaint  Patient presents with   Hypertension    4 week follow up     HPI Sara Woods is here for follow up and re-evaluation of uncontrolled hypertension She has been faithfully testing BP at home and most often SBP is over 150, and DBP also elevated Denies chest pain, palpitation, leg swelling, PND or orthopnea C/o increased stress and anxiety , ahs lost 4 cousins and 1 uncle in past 6 to 8 months and also has family in [place] st[place]ened area of the [place] ROS Denies recent fever or chills. Denies sinus pressure, nasal congestion, ear pain or sore throat. Denies chest congestion, productive cough or wheezing. . Denies uncontrolled  joint pain, swelling and limitation in mobility. Denies headaches, seizures, numbness, or tingling. C/o depression and  anxiety . Denies skin break down or rash.   PE  BP (!) 150/86   Pulse 84   Resp 18   Ht 5' 5.5 (1.664 m)   Wt 155 lb 0.3 oz (70.3 kg)   SpO2 96%   BMI 25.40 kg/m   Patient alert and oriented d, slightly confused and at times tearful and in no cardiopulmonary distress.  HEENT: No facial asymmetry, EOMI,     Neck supple .  Chest: Clear to auscultation bilaterally.  CVS: S1, S2 no murmurs, no S3.Regular rate.  ABD: Soft non tender.   Ext: No edema  MS: Adequate ROM spine, shoulders, hips and knees.  Skin: Intact, no ulcerations or rash noted.  CNS: CN 2-12 intact, no focal deficits noted.   Assessment & Plan  Essential hypertension Uncontrolled , add amlodipine  2.5 mg daily DASH diet and commitment to daily physical activity for a minimum of 30 minutes discussed and encouraged, as a part of hypertension management. The importance of attaining a healthy weight is also discussed.     11/18/2023    2:18 PM 11/18/2023    1:42 PM 10/12/2023    2:23 PM 10/12/2023    1:40 PM 05/10/2023    2:13 PM 03/23/2023    1:54 PM 03/23/2023    1:52 PM  BP/Weight   Systolic BP 150 134 156 149 -- 134 144  Diastolic BP 86 80 96 93 -- 82 84  Wt. (Lbs)  155.02  152.04 140  159.12  BMI  25.4 kg/m2  24.92 kg/m2 22.94 kg/m2  26.48 kg/m2

## 2023-11-18 NOTE — Patient Instructions (Addendum)
 F/U in 8 weeks re evaluate blood pressure  New additional dose of amlodipine  2.5 mg one daily, continue amlodipine  5 mg so total daily dose is 7.5 mg daily Continue lisinopril  40 mg one daily  Pls get labs ordered in June, past due, today  Please schedule mammogram at checkout  Thanks for choosing [facility], we consider it a privelige to serve you.

## 2023-11-19 LAB — VITAMIN D 25 HYDROXY (VIT D DEFICIENCY, FRACTURES): Vit D, 25-Hydroxy: 41.1 ng/mL (ref 30.0–100.0)

## 2023-11-19 LAB — CMP14+EGFR
ALT: 38 IU/L — ABNORMAL HIGH (ref 0–32)
AST: 26 IU/L (ref 0–40)
Albumin: 4.2 g/dL (ref 3.8–4.8)
Alkaline Phosphatase: 95 IU/L (ref 44–121)
BUN/Creatinine Ratio: 33 — ABNORMAL HIGH (ref 12–28)
BUN: 23 mg/dL (ref 8–27)
Bilirubin Total: 0.2 mg/dL (ref 0.0–1.2)
CO2: 20 mmol/L (ref 20–29)
Calcium: 10 mg/dL (ref 8.7–10.3)
Chloride: 110 mmol/L — ABNORMAL HIGH (ref 96–106)
Creatinine, Ser: 0.7 mg/dL (ref 0.57–1.00)
Globulin, Total: 2 g/dL (ref 1.5–4.5)
Glucose: 79 mg/dL (ref 70–99)
Potassium: 4 mmol/L (ref 3.5–5.2)
Sodium: 145 mmol/L — ABNORMAL HIGH (ref 134–144)
Total Protein: 6.2 g/dL (ref 6.0–8.5)
eGFR: 88 mL/min/1.73 (ref >59)

## 2023-11-19 LAB — CBC WITH DIFFERENTIAL/PLATELET
Basophils Absolute: 0.1 x10E3/uL (ref 0.0–0.2)
Basos: 1 %
EOS (ABSOLUTE): 0.1 x10E3/uL (ref 0.0–0.4)
Eos: 1 %
Hematocrit: 42 % (ref 34.0–46.6)
Hemoglobin: 13.3 g/dL (ref 11.1–15.9)
Immature Grans (Abs): 0 x10E3/uL (ref 0.0–0.1)
Immature Granulocytes: 0 %
Lymphocytes Absolute: 1.7 x10E3/uL (ref 0.7–3.1)
Lymphs: 22 %
MCH: 30.9 pg (ref 26.6–33.0)
MCHC: 31.7 g/dL (ref 31.5–35.7)
MCV: 98 fL — ABNORMAL HIGH (ref 79–97)
Monocytes Absolute: 0.5 x10E3/uL (ref 0.1–0.9)
Monocytes: 7 %
Neutrophils Absolute: 5.4 x10E3/uL (ref 1.4–7.0)
Neutrophils: 69 %
Platelets: 323 x10E3/uL (ref 150–450)
RBC: 4.3 x10E6/uL (ref 3.77–5.28)
RDW: 12.3 % (ref 11.7–15.4)
WBC: 7.7 x10E3/uL (ref 3.4–10.8)

## 2023-11-19 LAB — LIPID PANEL
Chol/HDL Ratio: 2.4 ratio (ref 0.0–4.4)
Cholesterol, Total: 124 mg/dL (ref 100–199)
HDL: 52 mg/dL (ref >39)
LDL Chol Calc (NIH): 53 mg/dL (ref 0–99)
Triglycerides: 101 mg/dL (ref 0–149)
VLDL Cholesterol Cal: 19 mg/dL (ref 5–40)

## 2023-11-19 LAB — TSH: TSH: 2.68 u[IU]/mL (ref 0.450–4.500)

## 2023-11-20 ENCOUNTER — Ambulatory Visit: Payer: Self-pay | Admitting: Family Medicine

## 2023-11-22 ENCOUNTER — Telehealth: Payer: Self-pay

## 2023-11-22 ENCOUNTER — Other Ambulatory Visit (HOSPITAL_COMMUNITY): Payer: Self-pay | Admitting: Family Medicine

## 2023-11-22 DIAGNOSIS — Z1231 Encounter for screening mammogram for malignant neoplasm of breast: Secondary | ICD-10-CM

## 2023-11-22 NOTE — Telephone Encounter (Signed)
 Called scheduled mammogram

## 2023-11-22 NOTE — Telephone Encounter (Signed)
 Copied from CRM [id number]. Topic: Clinical - Request for Lab/Test Order >> Nov 22, 2023  9:51 AM Myrick T wrote: Reason for CRM: patient called request an appt for a mammogram. Please f/u with paitent

## 2023-12-04 ENCOUNTER — Other Ambulatory Visit: Payer: Self-pay | Admitting: Family Medicine

## 2024-01-06 ENCOUNTER — Other Ambulatory Visit: Payer: Self-pay | Admitting: Family Medicine

## 2024-01-13 ENCOUNTER — Encounter: Payer: Self-pay | Admitting: Family Medicine

## 2024-01-13 ENCOUNTER — Ambulatory Visit (INDEPENDENT_AMBULATORY_CARE_PROVIDER_SITE_OTHER): Payer: Self-pay | Admitting: Family Medicine

## 2024-01-13 VITALS — BP 179/98 | HR 87 | Resp 16 | Ht 65.5 in | Wt 150.0 lb

## 2024-01-13 DIAGNOSIS — G4700 Insomnia, unspecified: Secondary | ICD-10-CM

## 2024-01-13 DIAGNOSIS — I10 Essential (primary) hypertension: Secondary | ICD-10-CM | POA: Diagnosis not present

## 2024-01-13 DIAGNOSIS — E785 Hyperlipidemia, unspecified: Secondary | ICD-10-CM | POA: Diagnosis not present

## 2024-01-13 DIAGNOSIS — Z23 Encounter for immunization: Secondary | ICD-10-CM | POA: Diagnosis not present

## 2024-01-13 MED ORDER — LISINOPRIL 40 MG PO TABS
40.0000 mg | ORAL_TABLET | Freq: Every day | ORAL | 3 refills | Status: AC
Start: 2024-01-13 — End: ?

## 2024-01-13 MED ORDER — AMLODIPINE BESYLATE 5 MG PO TABS: 5.0000 mg | ORAL_TABLET | Freq: Every day | ORAL | 3 refills | Status: DC

## 2024-01-13 MED ORDER — AMLODIPINE BESYLATE 2.5 MG PO TABS
2.5000 mg | ORAL_TABLET | Freq: Every day | ORAL | 3 refills | Status: DC
Start: 2024-01-13 — End: 2024-03-24

## 2024-01-13 NOTE — Assessment & Plan Note (Signed)
 Controlled, no change in medication

## 2024-01-13 NOTE — Assessment & Plan Note (Signed)
 Uncontrolled, note to pharmacy and pt re correct dose amlodipine   7.5 mg total, continue lisinopril  40 mg daily DASH diet and commitment to daily physical activity for a minimum of 30 minutes discussed and encouraged, as a part of hypertension management. The importance of attaining a healthy weight is also discussed.     01/13/2024   10:39 AM 01/13/2024   10:19 AM 11/18/2023    2:18 PM 11/18/2023    1:42 PM 10/12/2023    2:23 PM 10/12/2023    1:40 PM 05/10/2023    2:13 PM  BP/Weight  Systolic BP 179 155 150 134 156 149 --  Diastolic BP 98 93 86 80 96 93 --  Wt. (Lbs)  150.04  155.02  152.04 140  BMI  24.59 kg/m2  25.4 kg/m2  24.92 kg/m2 22.94 kg/m2

## 2024-01-13 NOTE — Progress Notes (Signed)
   Sara Woods     MRN: [id number]      DOB: [birth year]  Chief Complaint  Patient presents with   Hypertension    8 week follow up     HPI Sara Woods is here for follow up and re-evaluation of chronic medical conditions,in particular, uncontrolled hypertension, also  medication management and review of any available recent lab and radiology data.  Preventive health is updated, specifically  Immunization.   Unfortunately, on physical med review, Sara Woods was not getting amlodipine  5 mg tab which she should be on 7.5 mg total, so bP is still elevated  And uncontrolled The PT denies any adverse reactions to current medications since the last visit.  There are no new concerns.  There are no specific complaints   ROS Denies recent fever or chills. Denies sinus pressure, nasal congestion, ear pain or sore throat. Denies chest congestion, productive cough or wheezing. Denies chest pains, palpitations and leg swelling Denies abdominal pain, nausea, vomiting,diarrhea or constipation.   Denies dysuria, frequency, hesitancy or incontinence. Denies joint pain, swelling and limitation in mobility. Denies headaches, seizures, numbness, or tingling. Denies depression, anxiety or insomnia. Denies skin break down or rash.   PE  BP (!) 179/98   Pulse 87   Resp 16   Ht 5' 5.5 (1.664 m)   Wt 150 lb 0.6 oz (68.1 kg)   SpO2 98%   BMI 24.59 kg/m   Patient alert and oriented and in no cardiopulmonary distress.  HEENT: No facial asymmetry, EOMI,     Neck supple .  Chest: Clear to auscultation bilaterally.  CVS: S1, S2 no murmurs, no S3.Regular rate.  ABD: Soft non tender.   Ext: No edema  Sara: Adequate ROM spine, shoulders, hips and knees.  Skin: Intact, no ulcerations or rash noted.  Psych: Good eye contact, normal affect. Memory intact not anxious or depressed appearing.  CNS: CN 2-12 intact, power,  normal throughout.no focal deficits noted.   Assessment & Plan  Essential  hypertension Uncontrolled, note to pharmacy and pt re correct dose amlodipine   7.5 mg total, continue lisinopril  40 mg daily DASH diet and commitment to daily physical activity for a minimum of 30 minutes discussed and encouraged, as a part of hypertension management. The importance of attaining a healthy weight is also discussed.     01/13/2024   10:39 AM 01/13/2024   10:19 AM 11/18/2023    2:18 PM 11/18/2023    1:42 PM 10/12/2023    2:23 PM 10/12/2023    1:40 PM 05/10/2023    2:13 PM  BP/Weight  Systolic BP 179 155 150 134 156 149 --  Diastolic BP 98 93 86 80 96 93 --  Wt. (Lbs)  150.04  155.02  152.04 140  BMI  24.59 kg/m2  25.4 kg/m2  24.92 kg/m2 22.94 kg/m2       Hyperlipidemia LDL goal <100 Hyperlipidemia:Low fat diet discussed and encouraged.   Lipid Panel  Lab Results  Component Value Date   CHOL 124 11/18/2023   HDL 52 11/18/2023   LDLCALC 53 11/18/2023   TRIG 101 11/18/2023   CHOLHDL 2.4 11/18/2023     Controlled, no change in medication   Insomnia Controlled, no change in medication

## 2024-01-13 NOTE — Patient Instructions (Addendum)
 F/U with PCP in November , re eval Blood pressure  For your blood pressure take these 3 tablets every day at the same time  Amlodipine  5 mg One tablet once daily Amlodipine  2.5 mg One tablet once daily Lisinopril  40 mg one tablet once daily   Flu vaccine today  (Nurse if able pls call pharmacist and let them know as well please ( got misses already, she does not hav the amlodipine  5 mg tab taking)  Thanks for choosing [facility], we consider it a privelige to serve you.

## 2024-01-13 NOTE — Assessment & Plan Note (Signed)
 Hyperlipidemia:Low fat diet discussed and encouraged.   Lipid Panel  Lab Results  Component Value Date   CHOL 124 11/18/2023   HDL 52 11/18/2023   LDLCALC 53 11/18/2023   TRIG 101 11/18/2023   CHOLHDL 2.4 11/18/2023     Controlled, no change in medication

## 2024-02-04 ENCOUNTER — Other Ambulatory Visit: Payer: Self-pay | Admitting: Family Medicine

## 2024-03-04 ENCOUNTER — Other Ambulatory Visit: Payer: Self-pay | Admitting: Family Medicine

## 2024-03-14 ENCOUNTER — Ambulatory Visit

## 2024-03-24 ENCOUNTER — Ambulatory Visit: Admitting: Family Medicine

## 2024-03-24 ENCOUNTER — Encounter: Payer: Self-pay | Admitting: Family Medicine

## 2024-03-24 VITALS — BP 152/92 | HR 97 | Resp 16 | Ht 65.5 in | Wt 152.1 lb

## 2024-03-24 DIAGNOSIS — I10 Essential (primary) hypertension: Secondary | ICD-10-CM

## 2024-03-24 DIAGNOSIS — M79672 Pain in left foot: Secondary | ICD-10-CM

## 2024-03-24 MED ORDER — AMLODIPINE BESYLATE 10 MG PO TABS: 10.0000 mg | ORAL_TABLET | Freq: Every day | ORAL | 5 refills | Status: AC

## 2024-03-24 NOTE — Assessment & Plan Note (Signed)
 Uncontrolled increased amlodipine  to 10 mg  DASH diet and commitment to daily physical activity for a minimum of 30 minutes discussed and encouraged, as a part of hypertension management. The importance of attaining a healthy weight is also discussed.     03/24/2024   11:30 AM 01/13/2024   10:39 AM 01/13/2024   10:19 AM 11/18/2023    2:18 PM 11/18/2023    1:42 PM 10/12/2023    2:23 PM 10/12/2023    1:40 PM  BP/Weight  Systolic BP 152 179 155 150 134 156 149  Diastolic BP 78 98 93 86 80 96 93  Wt. (Lbs) 152.08  150.04  155.02  152.04  BMI 24.92 kg/m2  24.59 kg/m2  25.4 kg/m2  24.92 kg/m2

## 2024-03-24 NOTE — Patient Instructions (Addendum)
 F/u in 8 weeks, re evaluate blood pressure  New higher dose of amlodipine  10 mg daily ( OK to take two 5 mg tablets together every day if unable to get meds today)  You will be referred to Orthopedics in [place] for evaluation of painful knot on left foot x 6 weeks  Thanks for choosing [facility], we consider it a privelige to serve you.

## 2024-03-27 ENCOUNTER — Encounter: Payer: Self-pay | Admitting: Family Medicine

## 2024-03-27 DIAGNOSIS — M79672 Pain in left foot: Secondary | ICD-10-CM | POA: Insufficient documentation

## 2024-03-27 DIAGNOSIS — R2242 Localized swelling, mass and lump, left lower limb: Secondary | ICD-10-CM | POA: Insufficient documentation

## 2024-03-27 NOTE — Assessment & Plan Note (Signed)
 6 week history, no trauma, plaapble swelling / nodule or cyst, refer Ortho

## 2024-03-27 NOTE — Progress Notes (Signed)
   Sara Woods     MRN: [id number]      DOB: 10/24/1945  Chief Complaint  Patient presents with   Hypertension    Follow up     HPI Sara Woods is here for follow up of uncontrolled hypertension 6 week h/o painful knot : on dorsum of left foot, concerned may be cancerous, no inciting trauma ROS Denies recent fever or chills. Denies sinus pressure, nasal congestion, ear pain or sore throat. Denies chest congestion, productive cough or wheezing. Denies chest pains, palpitations and leg swelling Denies abdominal pain, nausea, vomiting,diarrhea or constipation.   Denies dysuria, frequency, hesitancy or incontinence. Denies skin break down or rash.   PE  BP (!) 152/92   Pulse 97   Resp 16   Ht 5' 5.5 (1.664 m)   Wt 152 lb 1.3 oz (69 kg)   SpO2 100%   BMI 24.92 kg/m   Patient alert and oriented and in no cardiopulmonary distress.  HEENT: No facial asymmetry, EOMI,     Neck supple .  Chest: Clear to auscultation bilaterally.  CVS: S1, S2 no murmurs, no S3.Regular rate.  .   Ext: No edema  MS: Adequate ROM spine, shoulders, hips and knees.Mildly tender nodule on dorsum of left foot  Skin: Intact, no ulcerations or rash noted.  Psych: Good eye contact, normal affect. Memory intact not anxious or depressed appearing.  CNS: CN 2-12 intact, power,  normal throughout.no focal deficits noted.   Assessment & Plan  Essential hypertension Uncontrolled increased amlodipine  to 10 mg  DASH diet and commitment to daily physical activity for a minimum of 30 minutes discussed and encouraged, as a part of hypertension management. The importance of attaining a healthy weight is also discussed.     03/24/2024   11:30 AM 01/13/2024   10:39 AM 01/13/2024   10:19 AM 11/18/2023    2:18 PM 11/18/2023    1:42 PM 10/12/2023    2:23 PM 10/12/2023    1:40 PM  BP/Weight  Systolic BP 152 179 155 150 134 156 149  Diastolic BP 78 98 93 86 80 96 93  Wt. (Lbs) 152.08  150.04  155.02  152.04   BMI 24.92 kg/m2  24.59 kg/m2  25.4 kg/m2  24.92 kg/m2       Left foot pain 6 week history, no trauma, plaapble swelling / nodule or cyst, refer Ortho

## 2024-04-02 ENCOUNTER — Other Ambulatory Visit: Payer: Self-pay | Admitting: Family Medicine

## 2024-04-05 ENCOUNTER — Ambulatory Visit: Payer: Self-pay

## 2024-04-05 ENCOUNTER — Telehealth: Payer: Self-pay

## 2024-04-05 NOTE — Telephone Encounter (Signed)
 FYI Only or Action Required?: Action required by provider: referral request.  Patient was last seen in primary care on 03/24/2024 by Antonetta Rollene BRAVO, MD.  Called Nurse Triage reporting Referral.  Symptoms began at least 6 weeks ago (ortho consult).  Interventions attempted: Other: Patient awaiting ortho consult, has not heard anything.  Symptoms are: unchanged.  Triage Disposition: Call PCP When Office is Open, Ortho number provided.  Patient/caregiver understands and will follow disposition?: Yes  Copied from CRM [id number]. Topic: Clinical - Red Word Triage >> Apr 05, 2024  1:19 PM Harlene ORN wrote: Red Word that prompted transfer to Nurse Triage: left leg is swelling and cannot walk on it very well. Reason for Disposition  [1] Caller requesting NON-URGENT health information AND [2] PCP's office is the best resource  Answer Assessment - Initial Assessment Questions 1. REASON FOR CALL: What is the main reason for your call? or How can I best help you?     Inquiring about ortho referral.   2. SYMPTOMS : Do you have any symptoms?      Denies any new symptoms.   3. OTHER QUESTIONS: Do you have any other questions?     Reports frustration with process and inquires why the referral wasn't sent.  She asks why she needs to call ortho and states her doctor's office should have done this.   Referral is in patient's chart but still pended. Provided patient with Ortho number to call: [phone]. Patient states will call.  Protocols used: Information Only Call - No Triage-A-AH

## 2024-04-05 NOTE — Telephone Encounter (Signed)
 Copied from CRM [id number]. Topic: Referral - Status >> Apr 05, 2024  1:18 PM Harlene ORN wrote: Reason for CRM: Patient called to follow up on referral order for the knot on left foot. Informed patient the order was pending. Please call back patient when referral is ready for her to schedule.

## 2024-04-05 NOTE — Telephone Encounter (Signed)
Message sent to referral coordinator

## 2024-04-10 NOTE — Telephone Encounter (Signed)
 Referral sent to: [facility] at [facility] [address] [place] [address] [phone]  MyChart Message sent to Patient with Specialty Office contact information.

## 2024-04-18 ENCOUNTER — Telehealth: Payer: Self-pay | Admitting: Orthopedic Surgery

## 2024-04-18 NOTE — Telephone Encounter (Signed)
Returned the pt's call, lvm.

## 2024-04-19 ENCOUNTER — Encounter: Payer: Self-pay | Admitting: Orthopedic Surgery

## 2024-04-19 ENCOUNTER — Other Ambulatory Visit: Payer: Self-pay

## 2024-04-19 ENCOUNTER — Ambulatory Visit: Admitting: Orthopedic Surgery

## 2024-04-19 VITALS — BP 134/84 | HR 91 | Ht 65.5 in | Wt 151.0 lb

## 2024-04-19 DIAGNOSIS — M19072 Primary osteoarthritis, left ankle and foot: Secondary | ICD-10-CM | POA: Diagnosis not present

## 2024-04-19 DIAGNOSIS — M79672 Pain in left foot: Secondary | ICD-10-CM

## 2024-04-19 DIAGNOSIS — M67472 Ganglion, left ankle and foot: Secondary | ICD-10-CM

## 2024-04-19 NOTE — Progress Notes (Signed)
 New Patient Visit  Assessment: Sara Woods is a 78 y.o. female with the following: 1. Ganglion cyst of left foot  Plan: Sara Woods has a small amount of swelling in the dorsal aspect of the left foot.  Radiographs demonstrates some degenerative changes in the midfoot area.  Presentation most consistent with a cyst.  Currently it is small.  She does have a history of cancer, including multiple surgeries on the left leg, but there are no other concerning features regarding the growth in her left foot.  I provided reassurance.  However, if she has any issues or concerns, she should return to clinic.  We can consider an MRI.  If it gets a lot bigger, we can consider aspiration.  She states understanding.  She will return to clinic as needed.  Follow-up: Return if symptoms worsen or fail to improve.  Subjective:  Chief Complaint  Patient presents with   Foot Pain    L foot nodule for 2-3 mos. Pt states that she feels the nodule moves around and changes in size.     History of Present Illness: Sara Woods is a 78 y.o. female who has been referred by Rollene Pesa, MD for evaluation of left foot swelling.  She states that she has had a nodule on the dorsal aspect of the left foot for the past 2-3 months.  No prior injury.  She states she has some pain, which waxes and wanes.  The size of the nodule has changed.  When she was referred by Dr. Pesa, she states it was larger, and more firm.  She also notes that the position of the swelling changes.  She reports that she has a history of cancer, and had multiple surgeries in the left leg.  Because of this, she does have some concerns about the appearance and swelling.  Her health has remained stable.  She has not noticed any bruising or color changes.   Review of Systems: No fevers or chills No numbness or tingling No chest pain No shortness of breath No bowel or bladder dysfunction No GI distress No headaches   Medical  History:  Past Medical History:  Diagnosis Date   Allergic rhinitis    Anxiety    Arthritis    Depression approx 1983   History of migraines    Hyperlipidemia approx 2006   Hypertension approx 2000   MRSA infection    Recurrent abcesses to legs about 8 years ago   Recurrent miscarriages due to luteal phase defect, not pregnant    5 miscarriages with 1 living adult son    Past Surgical History:  Procedure Laterality Date   BREAST EXCISIONAL BIOPSY Left    COLONOSCOPY WITH PROPOFOL  N/A 10/27/2022   Procedure: COLONOSCOPY WITH PROPOFOL ;  Surgeon: Cindie Carlin POUR, DO;  [facility];  Service: Endoscopy;  Laterality: N/A;  11:15 AM, ASA 2   ETHMOIDECTOMY Right 11/01/2018   Procedure: RIGHT TOTAL ETHMOIDECTOMY;  Surgeon: Karis Clunes, MD;  Location: [facility];  Service: ENT;  Laterality: Right;   EYE SURGERY Left 01/13/2021   FRONTAL SINUS EXPLORATION Right 11/01/2018   Procedure: FRONTAL SINUS EXPLORATION;  Surgeon: Karis Clunes, MD;  Location: [facility];  Service: ENT;  Laterality: Right;   MAXILLARY ANTROSTOMY Right 11/01/2018   Procedure: RIGHT ENDOSCOPIC MAXILLARY ANTROSTOMY WITH TISSUE REMOVAL;  Surgeon: Karis Clunes, MD;  Location:  SURGERY CENTER;  Service: ENT;  Laterality: Right;   NASAL SEPTOPLASTY W/  TURBINOPLASTY N/A 11/01/2018   Procedure: NASAL SEPTOPLASTY WITH TURBINATE REDUCTION;  Surgeon: Karis Clunes, MD;  Location: [facility];  Service: ENT;  Laterality: N/A;   SINUS ENDO WITH FUSION N/A 11/01/2018   Procedure: SINUS ENDO WITH FUSION;  Surgeon: Karis Clunes, MD;  Location: [facility];  Service: ENT;  Laterality: N/A;   SKIN CANCER DESTRUCTION     Back of left leg, right shoulder   SPHENOIDECTOMY Right 11/01/2018   Procedure: RIGHT SPHENOIDECTOMY WITH TISSUE REMOVAL;  Surgeon: Karis Clunes, MD;  Location: [facility];  Service: ENT;  Laterality: Right;   TONSILLECTOMY     Childhood   TUBAL  LIGATION     TUMOR EXCISION     Tongue    Family History  Problem Relation Age of Onset   Prostate cancer Father        metastic, 04/2006   Obesity Sister    Fibromyalgia Sister    Osteoporosis Mother    Cerebrovascular Disease Mother    Brain cancer Mother    Cerebrovascular Disease Brother    Stroke Son        x3, Vertebral artery dissection, January 2009   Hypertension Son    Social History   Tobacco Use   Smoking status: Never   Smokeless tobacco: Never  Vaping Use   Vaping status: Never Used  Substance Use Topics   Alcohol use: No   Drug use: No    Allergies  Allergen Reactions   Adhesive [Tape] Itching and Other (See Comments)    REACTION: redness and itching at contact   Penicillins Rash   Sulfonamide Derivatives Rash    Current Meds  Medication Sig   amLODipine  (NORVASC ) 10 MG tablet Take 1 tablet (10 mg total) by mouth daily.   Calcium -Vitamin D  (CALTRATE 600 PLUS-VIT D PO) Take 2 tablets by mouth daily.   diclofenac  (VOLTAREN ) 50 MG EC tablet take 1 tablet 2 times a day.   doxepin  (SINEQUAN ) 10 MG capsule take 2 capsules every night at bedtime   fluticasone  (FLONASE ) 50 MCG/ACT nasal spray place TWO SPRAYS into BOTH nostrils DAILY   gabapentin  (NEURONTIN ) 300 MG capsule take 1 capsule by mouth twice daily as needed for pain   KRILL OIL 1000 MG CAPS Take 1 capsule by mouth daily.   lisinopril  (ZESTRIL ) 40 MG tablet Take 1 tablet (40 mg total) by mouth daily.   meclizine  (ANTIVERT ) 25 MG tablet TAKE ONE TABLET BY MOUTH THREE TIMES DAILY AS NEEDED FOR DIZZINESS   multivitamin (THERAGRAN) per tablet Take 1 tablet by mouth daily.   rosuvastatin  (CRESTOR ) 40 MG tablet take 1 tablet once a day.   topiramate  (TOPAMAX ) 50 MG tablet take 1 tablet by mouth twice daily    Objective: BP 134/84   Pulse 91   Ht 5' 5.5 (1.664 m)   Wt 151 lb (68.5 kg)   BMI 24.75 kg/m   Physical Exam:  General: Alert and oriented. and No acute distress. Gait: Normal  gait.  Left foot without obvious swelling.  No redness.  No deformity.  Over the dorsal aspect of the foot, just distal to the tibiotalar joint, there is a small growth.  No redness or overlying skin changes.  It is soft and compressible.  It is mobile underneath the skin.  She has no pain with inversion or eversion of the ankle.  Sensation is intact to the dorsum of the foot.  IMAGING: I personally ordered and reviewed the following  images  X-rays of the left foot were obtained in clinic today.  These are nonweightbearing views.  No acute injuries.  Mild degenerative changes at the tibiotalar joint.  Mild degenerative changes within the midfoot area.  No bone cyst.  No bony lesions.  No obvious soft tissue growth.  No calcifications appreciated over the dorsal midfoot.  Impression: Left foot x-ray without acute injury; mild to moderate midfoot arthritis   New Medications:  No orders of the defined types were placed in this encounter.     Oneil DELENA Horde, MD  04/19/2024 11:31 AM

## 2024-05-05 ENCOUNTER — Other Ambulatory Visit: Payer: Self-pay | Admitting: Family Medicine

## 2024-05-10 ENCOUNTER — Ambulatory Visit (INDEPENDENT_AMBULATORY_CARE_PROVIDER_SITE_OTHER)

## 2024-05-10 ENCOUNTER — Ambulatory Visit (HOSPITAL_COMMUNITY)
Admission: RE | Admit: 2024-05-10 | Discharge: 2024-05-10 | Disposition: A | Discharge status: Home / Self Care | Source: Ambulatory Visit | Attending: Family Medicine | Admitting: Family Medicine

## 2024-05-10 ENCOUNTER — Encounter (HOSPITAL_COMMUNITY): Payer: Self-pay

## 2024-05-10 VITALS — BP 130/74 | HR 78 | Resp 14 | Ht 65.0 in | Wt 149.1 lb

## 2024-05-10 DIAGNOSIS — Z0000 Encounter for general adult medical examination without abnormal findings: Secondary | ICD-10-CM | POA: Diagnosis not present

## 2024-05-10 DIAGNOSIS — Z1231 Encounter for screening mammogram for malignant neoplasm of breast: Secondary | ICD-10-CM | POA: Diagnosis present

## 2024-05-10 NOTE — Patient Instructions (Signed)
 Sara Woods,  Thank you for taking the time for your Medicare Wellness Visit. I appreciate your continued commitment to your health goals. Please review the care plan we discussed, and feel free to reach out if I can assist you further.  Please note that Annual Wellness Visits do not include a physical exam. Some assessments may be limited, especially if the visit was conducted virtually. If needed, we may recommend an in-person follow-up with your provider.  Ongoing Care Seeing your primary care provider every 3 to 6 months helps us  monitor your health and provide consistent, personalized care.   1 year follow up for Medicare well visit: Tuesday May 15, 2025 at 11:20 am  with medicare wellness nurse in office  Recommended Screenings:  Health Maintenance  Topic Date Due   Zoster (Shingles) Vaccine (1 of 2) 05/23/1964   DTaP/Tdap/Td vaccine (2 - Tdap) 02/25/2020   COVID-19 Vaccine (7 - 2025-26 season) 01/10/2024   Medicare Annual Wellness Visit  05/09/2024   Breast Cancer Screening  05/09/2024   Osteoporosis screening with Bone Density Scan  05/09/2025   Pneumococcal Vaccine for age over 63  Completed   Flu Shot  Completed   Hepatitis C Screening  Completed   Meningitis B Vaccine  Aged Out   Colon Cancer Screening  Discontinued       05/10/2024   10:33 AM  Advanced Directives  Does Patient Have a Medical Advance Directive? No  Would patient like information on creating a medical advance directive? Yes (MAU/Ambulatory/Procedural Areas - Information given)    Vision: Annual vision screenings are recommended for early detection of glaucoma, cataracts, and diabetic retinopathy. These exams can also reveal signs of chronic conditions such as diabetes and high blood pressure.  Dental: Annual dental screenings help detect early signs of oral cancer, gum disease, and other conditions linked to overall health, including heart disease and diabetes.  Please see the attached documents  for additional preventive care recommendations.

## 2024-05-10 NOTE — Progress Notes (Signed)
 " Appointment(s) made: (Follow up with pcp June 29, 2024 for chronic health conditions)  Patient's blood pressure elevated on first reading. Retaken manually after patient had time to relax and blood pressure was lower. She has a nurse visit next month for bp check Chief Complaint  Patient presents with   Medicare Wellness     Subjective:   Sara Woods is a 78 y.o. female who presents for a Medicare Annual Wellness Visit.  Visit info / Clinical Intake: Medicare Wellness Visit Type:: Subsequent Annual Wellness Visit Persons participating in visit and providing information:: patient Medicare Wellness Visit Mode:: In-person (required for WTM) Interpreter Needed?: No Pre-visit prep was completed: yes AWV questionnaire completed by patient prior to visit?: no Living arrangements:: lives with spouse/significant other Patient's Overall Health Status Rating: very good Typical amount of pain: none Does pain affect daily life?: no Are you currently prescribed opioids?: no  Dietary Habits and Nutritional Risks How many meals a day?: 2 Eats fruit and vegetables daily?: yes Most meals are obtained by: preparing own meals In the last 2 weeks, have you had any of the following?: none Diabetic:: no  Functional Status Activities of Daily Living (to include ambulation/medication): Independent Ambulation: Independent Medication Administration: Independent Home Management (perform basic housework or laundry): Independent Manage your own finances?: yes Primary transportation is: driving Concerns about vision?: no *vision screening is required for WTM* Concerns about hearing?: no  Fall Screening Falls in the past year?: 0 Number of falls in past year: 0 Was there an injury with Fall?: 0 Fall Risk Category Calculator: 0 Patient Fall Risk Level: Low Fall Risk  Fall Risk Patient at Risk for Falls Due to: No Fall Risks Fall risk Follow up: Falls evaluation completed; Education  provided; Falls prevention discussed  Home and Transportation Safety: All rugs have non-skid backing?: N/A, no rugs All stairs or steps have railings?: yes Grab bars in the bathtub or shower?: yes Have non-skid surface in bathtub or shower?: yes Good home lighting?: yes Regular seat belt use?: yes Hospital stays in the last year:: no  Cognitive Assessment Difficulty concentrating, remembering, or making decisions? : no Will 6CIT or Mini Cog be Completed: yes What year is it?: 0 points What month is it?: 0 points Give patient an address phrase to remember (5 components): [address] [place] About what time is it?: 0 points Count backwards from 20 to 1: 0 points Say the months of the year in reverse: 0 points Repeat the address phrase from earlier: 0 points 6 CIT Score: 0 points  Advance Directives (For Healthcare) Does Patient Have a Medical Advance Directive?: No Would patient like information on creating a medical advance directive?: Yes (MAU/Ambulatory/Procedural Areas - Information given)  Reviewed/Updated  Reviewed/Updated: Reviewed All (Medical, Surgical, Family, Medications, Allergies, Care Teams, Patient Goals)    Allergies (verified) Adhesive [tape], Penicillins, and Sulfonamide derivatives   Current Medications (verified) Outpatient Encounter Medications as of 05/10/2024  Medication Sig   amLODipine  (NORVASC ) 10 MG tablet Take 1 tablet (10 mg total) by mouth daily.   Calcium -Vitamin D  (CALTRATE 600 PLUS-VIT D PO) Take 2 tablets by mouth daily.   diclofenac  (VOLTAREN ) 50 MG EC tablet take 1 tablet 2 times a day.   doxepin  (SINEQUAN ) 10 MG capsule take 2 capsules every night at bedtime   fluticasone  (FLONASE ) 50 MCG/ACT nasal spray place TWO SPRAYS into BOTH nostrils DAILY   gabapentin  (NEURONTIN ) 300 MG capsule take 1 capsule by mouth twice daily as needed for  pain   KRILL OIL 1000 MG CAPS Take 1 capsule by mouth daily.   lisinopril  (ZESTRIL ) 40 MG tablet  Take 1 tablet (40 mg total) by mouth daily.   meclizine  (ANTIVERT ) 25 MG tablet TAKE ONE TABLET BY MOUTH THREE TIMES DAILY AS NEEDED FOR DIZZINESS   multivitamin (THERAGRAN) per tablet Take 1 tablet by mouth daily.   rosuvastatin  (CRESTOR ) 40 MG tablet take 1 tablet once a day.   topiramate  (TOPAMAX ) 50 MG tablet take 1 tablet by mouth twice daily   No facility-administered encounter medications on file as of 05/10/2024.    History: Past Medical History:  Diagnosis Date   Allergic rhinitis    Anxiety    Arthritis    Depression approx 1983   History of migraines    Hyperlipidemia approx 2006   Hypertension approx 2000   MRSA infection    Recurrent abcesses to legs about 8 years ago   Recurrent miscarriages due to luteal phase defect, not pregnant    5 miscarriages with 1 living adult son   Past Surgical History:  Procedure Laterality Date   BREAST EXCISIONAL BIOPSY Left    COLONOSCOPY WITH PROPOFOL  N/A 10/27/2022   Procedure: COLONOSCOPY WITH PROPOFOL ;  Surgeon: Cindie Carlin POUR, DO;  [facility];  Service: Endoscopy;  Laterality: N/A;  11:15 AM, ASA 2   ETHMOIDECTOMY Right 11/01/2018   Procedure: RIGHT TOTAL ETHMOIDECTOMY;  Surgeon: Karis Clunes, MD;  Location: [facility];  Service: ENT;  Laterality: Right;   EYE SURGERY Left 01/13/2021   FRONTAL SINUS EXPLORATION Right 11/01/2018   Procedure: FRONTAL SINUS EXPLORATION;  Surgeon: Karis Clunes, MD;  Location: [facility];  Service: ENT;  Laterality: Right;   MAXILLARY ANTROSTOMY Right 11/01/2018   Procedure: RIGHT ENDOSCOPIC MAXILLARY ANTROSTOMY WITH TISSUE REMOVAL;  Surgeon: Karis Clunes, MD;  Location: [facility];  Service: ENT;  Laterality: Right;   NASAL SEPTOPLASTY W/ TURBINOPLASTY N/A 11/01/2018   Procedure: NASAL SEPTOPLASTY WITH TURBINATE REDUCTION;  Surgeon: Karis Clunes, MD;  Location: [facility];  Service: ENT;  Laterality: N/A;   SINUS ENDO WITH FUSION N/A  11/01/2018   Procedure: SINUS ENDO WITH FUSION;  Surgeon: Karis Clunes, MD;  Location: [facility];  Service: ENT;  Laterality: N/A;   SKIN CANCER DESTRUCTION     Back of left leg, right shoulder   SPHENOIDECTOMY Right 11/01/2018   Procedure: RIGHT SPHENOIDECTOMY WITH TISSUE REMOVAL;  Surgeon: Karis Clunes, MD;  Location: [facility];  Service: ENT;  Laterality: Right;   TONSILLECTOMY     Childhood   TUBAL LIGATION     TUMOR EXCISION     Tongue   Family History  Problem Relation Age of Onset   Prostate cancer Father        metastic, 04/2006   Obesity Sister    Fibromyalgia Sister    Osteoporosis Mother    Cerebrovascular Disease Mother    Brain cancer Mother    Cerebrovascular Disease Brother    Stroke Son        x3, Vertebral artery dissection, January 2009   Hypertension Son    Social History   Occupational History   Occupation: retired   Tobacco Use   Smoking status: Never   Smokeless tobacco: Never  Vaping Use   Vaping status: Never Used  Substance and Sexual Activity   Alcohol use: No   Drug use: No   Sexual activity: Not on file  Tobacco Counseling Counseling given: Yes  SDOH Screenings   Food Insecurity: No Food Insecurity (05/10/2024)  Housing: Low Risk (05/10/2024)  Transportation Needs: No Transportation Needs (05/10/2024)  Utilities: Not At Risk (05/10/2024)  Alcohol Screen: Low Risk (05/10/2023)  Depression (PHQ2-9): Low Risk (05/10/2024)  Financial Resource Strain: Low Risk (05/10/2023)  Physical Activity: Insufficiently Active (05/10/2024)  Social Connections: Moderately Isolated (05/10/2024)  Stress: No Stress Concern Present (05/10/2024)  Tobacco Use: Low Risk (05/10/2024)  Health Literacy: Adequate Health Literacy (05/10/2024)   See flowsheets for full screening details  Depression Screen PHQ 2 & 9 Depression Scale- Over the past 2 weeks, how often have you been bothered by any of the following problems? Little  interest or pleasure in doing things: 0 Feeling down, depressed, or hopeless (PHQ Adolescent also includes...irritable): 0 PHQ-2 Total Score: 0 Trouble falling or staying asleep, or sleeping too much: 0 Feeling tired or having little energy: 0 Poor appetite or overeating (PHQ Adolescent also includes...weight loss): 0 Feeling bad about yourself - or that you are a failure or have let yourself or your family down: 0 Trouble concentrating on things, such as reading the newspaper or watching television (PHQ Adolescent also includes...like school work): 0 Moving or speaking so slowly that other people could have noticed. Or the opposite - being so fidgety or restless that you have been moving around a lot more than usual: 0 Thoughts that you would be better off dead, or of hurting yourself in some way: 0 PHQ-9 Total Score: 0  Depression Treatment Depression Interventions/Treatment : PHQ2-9 Score <4 Follow-up Not Indicated     Goals Addressed               This Visit's Progress     I want to get my yard back in order (pt-stated)               Objective:    Today's Vitals   05/10/24 1022 05/10/24 1026  BP: (!) 146/81 130/74  Pulse: 78   Resp: 14   SpO2: 96%   Weight: 149 lb 1.9 oz (67.6 kg)   Height: 5' 5 (1.651 m)    Body mass index is 24.81 kg/m.  Hearing/Vision screen Hearing Screening - Comments:: Patient denies any hearing difficulties.   Vision Screening - Comments:: Wears rx glasses - up to date with routine eye exams with  Dr. Ladora at [facility] in [place] Immunizations and Health Maintenance Health Maintenance  Topic Date Due   Zoster Vaccines- Shingrix (1 of 2) 05/23/1964   DTaP/Tdap/Td (2 - Tdap) 02/25/2020   COVID-19 Vaccine (7 - 2025-26 season) 01/10/2024   Medicare Annual Wellness (AWV)  05/09/2024   Mammogram  05/09/2024   Bone Density Scan  05/09/2025   Pneumococcal Vaccine: 50+ Years  Completed   Influenza Vaccine  Completed   Hepatitis C  Screening  Completed   Meningococcal B Vaccine  Aged Out   Colonoscopy  Discontinued        Assessment/Plan:  This is a routine wellness examination for Sara Woods.  Patient Care Team: Antonetta Rollene BRAVO, MD as PCP - Diedre Eda Baxter, MD as Referring Physician (Dermatology) Ladora Ross Lacy Phebe, MD as Referring Physician (Optometry) Onesimo Oneil LABOR, MD as Consulting Physician (Orthopedic Surgery)  I have personally reviewed and noted the following in the patients chart:   Medical and social history Use of alcohol, tobacco or illicit drugs  Current medications and supplements including opioid prescriptions. Functional ability and status Nutritional status Physical  activity Advanced directives List of other physicians Hospitalizations, surgeries, and ER visits in previous 12 months Vitals Screenings to include cognitive, depression, and falls Referrals and appointments  No orders of the defined types were placed in this encounter.  In addition, I have reviewed and discussed with patient certain preventive protocols, quality metrics, and best practice recommendations. A written personalized care plan for preventive services as well as general preventive health recommendations were provided to patient.   Jadelin Eng, CMA   05/10/2024   Return for Medicare Wellness Visit Tuesday May 15, 2025 at 11:20 am in the office.  After Visit Summary: (In Person-Declined) Patient declined AVS at this time.  "

## 2024-05-16 ENCOUNTER — Telehealth: Payer: Self-pay | Admitting: Family Medicine

## 2024-05-16 NOTE — Telephone Encounter (Signed)
 MSG FROM INS THAT AS OF 05/11/2024 GABAPENTIN  300 MG WILL NOT BE COVERED. fORM LEFT IN YOUR BOX pLS SEND TO HER INS FOR ALTERNATIVES SO WE CAN WORK THROUGH THIS, THANKS

## 2024-05-17 ENCOUNTER — Telehealth: Payer: Self-pay

## 2024-05-17 NOTE — Telephone Encounter (Signed)
 Pt informed

## 2024-05-17 NOTE — Progress Notes (Signed)
" ° °  05/17/2024  Patient ID: Sara Woods, female   DOB: August 23, 1945, 79 y.o.   MRN: [id number]  Attempted test claim on gabapentin  given letter from patient stating gabapentin  would not be covered. Was not able to process test claim.   Advised Abby P CMA that she should contact insurance regarding any alternatives or seeing if it is just a quantity limit being imposed.   Lang Sieve, PharmD, BCGP Clinical Pharmacist  [phone]  "

## 2024-05-19 ENCOUNTER — Ambulatory Visit

## 2024-05-19 DIAGNOSIS — I10 Essential (primary) hypertension: Secondary | ICD-10-CM

## 2024-05-19 NOTE — Progress Notes (Signed)
 Patient is in office today for a nurse visit for Blood Pressure Check. Patient blood pressure was 119/75, Patient No chest pain, No shortness of breath, No dyspnea on exertion

## 2024-05-26 ENCOUNTER — Ambulatory Visit: Admitting: Family Medicine

## 2024-06-29 ENCOUNTER — Ambulatory Visit: Payer: Self-pay | Admitting: Family Medicine

## 2025-05-15 ENCOUNTER — Ambulatory Visit: Payer: Self-pay
